# Patient Record
Sex: Female | Born: 1965 | Race: White | Hispanic: No | Marital: Married | Smoking: Former smoker
Health system: Southern US, Community
[De-identification: ages and names within clinical notes are randomized; demographics above are authoritative.]

## PROBLEM LIST (undated history)

## (undated) DIAGNOSIS — K219 Gastro-esophageal reflux disease without esophagitis: Secondary | ICD-10-CM

## (undated) DIAGNOSIS — G4730 Sleep apnea, unspecified: Secondary | ICD-10-CM

## (undated) DIAGNOSIS — Z9989 Dependence on other enabling machines and devices: Secondary | ICD-10-CM

## (undated) DIAGNOSIS — T7840XA Allergy, unspecified, initial encounter: Secondary | ICD-10-CM

## (undated) DIAGNOSIS — E119 Type 2 diabetes mellitus without complications: Secondary | ICD-10-CM

## (undated) DIAGNOSIS — J309 Allergic rhinitis, unspecified: Secondary | ICD-10-CM

## (undated) DIAGNOSIS — J45909 Unspecified asthma, uncomplicated: Secondary | ICD-10-CM

## (undated) DIAGNOSIS — J189 Pneumonia, unspecified organism: Secondary | ICD-10-CM

## (undated) HISTORY — DX: Pneumonia, unspecified organism: J18.9

## (undated) HISTORY — DX: Allergic rhinitis, unspecified: J30.9

## (undated) HISTORY — DX: Allergy, unspecified, initial encounter: T78.40XA

## (undated) HISTORY — DX: Dependence on other enabling machines and devices: Z99.89

## (undated) HISTORY — DX: Sleep apnea, unspecified: G47.30

## (undated) HISTORY — PX: APPENDECTOMY: SHX54

## (undated) HISTORY — DX: Gastro-esophageal reflux disease without esophagitis: K21.9

## (undated) HISTORY — PX: ABDOMINAL HYSTERECTOMY: SHX81

---

## 1993-10-21 HISTORY — PX: TUBAL LIGATION: SHX77

## 2006-01-21 ENCOUNTER — Ambulatory Visit: Payer: Self-pay

## 2009-03-31 DIAGNOSIS — K219 Gastro-esophageal reflux disease without esophagitis: Secondary | ICD-10-CM

## 2009-04-01 DIAGNOSIS — J309 Allergic rhinitis, unspecified: Secondary | ICD-10-CM

## 2009-04-01 DIAGNOSIS — R609 Edema, unspecified: Secondary | ICD-10-CM | POA: Insufficient documentation

## 2009-07-20 ENCOUNTER — Ambulatory Visit: Payer: Self-pay | Admitting: Family Medicine

## 2009-07-26 ENCOUNTER — Ambulatory Visit: Payer: Self-pay | Admitting: Family Medicine

## 2009-11-09 ENCOUNTER — Ambulatory Visit: Payer: Self-pay | Admitting: Family Medicine

## 2010-08-06 ENCOUNTER — Ambulatory Visit: Payer: Self-pay | Admitting: Family Medicine

## 2010-08-06 LAB — HM MAMMOGRAPHY

## 2015-10-09 ENCOUNTER — Emergency Department: Payer: 59

## 2015-10-09 ENCOUNTER — Emergency Department
Admission: EM | Admit: 2015-10-09 | Discharge: 2015-10-09 | Disposition: A | Discharge status: Home / Self Care | Payer: 59 | Attending: Emergency Medicine | Admitting: Emergency Medicine

## 2015-10-09 ENCOUNTER — Encounter: Payer: Self-pay | Admitting: Emergency Medicine

## 2015-10-09 DIAGNOSIS — J40 Bronchitis, not specified as acute or chronic: Secondary | ICD-10-CM | POA: Insufficient documentation

## 2015-10-09 DIAGNOSIS — R05 Cough: Secondary | ICD-10-CM | POA: Diagnosis present

## 2015-10-09 MED ORDER — IPRATROPIUM-ALBUTEROL 0.5-2.5 (3) MG/3ML IN SOLN
9.0000 mL | Freq: Once | RESPIRATORY_TRACT | Status: AC
  Administered 2015-10-09: 9 mL via RESPIRATORY_TRACT
  Filled 2015-10-09: qty 9

## 2015-10-09 MED ORDER — PREDNISONE 20 MG PO TABS
60.0000 mg | ORAL_TABLET | Freq: Once | ORAL | Status: AC
  Administered 2015-10-09: 60 mg via ORAL
  Filled 2015-10-09: qty 3

## 2015-10-09 MED ORDER — PREDNISONE 20 MG PO TABS: 40.0000 mg | ORAL_TABLET | Freq: Every day | ORAL | Status: DC

## 2015-10-09 MED ORDER — ALBUTEROL SULFATE HFA 108 (90 BASE) MCG/ACT IN AERS: 2.0000 | INHALATION_SPRAY | RESPIRATORY_TRACT | Status: DC | PRN

## 2015-10-09 MED ORDER — HYDROCODONE-HOMATROPINE 5-1.5 MG/5ML PO SYRP: 5.0000 mL | ORAL_SOLUTION | Freq: Four times a day (QID) | ORAL | Status: DC | PRN

## 2015-10-09 NOTE — ED Notes (Signed)
Pt reports dx bronchitis 2wks ago, rx zpak and inhaler PRN; last few days having persistent nonprod cough and wheezing

## 2015-10-09 NOTE — ED Provider Notes (Signed)
[facility] Emergency Department Provider Note  ____________________________________________  Time seen: 2:50 AM  I have reviewed the triage vital signs and the nursing notes.   HISTORY  Chief Complaint Cough    HPI ILEE RANDLEMAN is a 49 y.o. female who complains of shortness of breath and a productive cough. She had been diagnosed with bronchitis 2 weeks ago and prescribed a Z-Pak Tessalon and albuterol inhaler. She had noticed some improvement after that, but in the last day or 2 she is more short of breath and now coughing as well. Denies fever. Denies chest pain. No pleuritic pain. No dizziness abdominal pain or back pain. No vomiting or diarrhea. No travel, trauma, hospitalization or surgeries. No other medical problems.     History reviewed. No pertinent past medical history.   There are no active problems to display for this patient.    Past Surgical History  Procedure Laterality Date  . Appendectomy    . Abdominal hysterectomy       Current Outpatient Rx  Name  Route  Sig  Dispense  Refill  . HYDROcodone-homatropine (HYCODAN) 5-1.5 MG/5ML syrup   Oral   Take 5 mLs by mouth every 6 (six) hours as needed for cough.   120 mL   0   . predniSONE (DELTASONE) 20 MG tablet   Oral   Take 2 tablets (40 mg total) by mouth daily.   8 tablet   0      Allergies Review of patient's allergies indicates no known allergies.   No family history on file.  Social History Social History  Substance Use Topics  . Smoking status: Never Smoker   . Smokeless tobacco: None  . Alcohol Use: No    Review of Systems  Constitutional:   No fever or chills. No weight changes Eyes:   No blurry vision or double vision.  ENT:   No sore throat. Cardiovascular:   No chest pain. Respiratory:   Positive shortness of breath and cough. Gastrointestinal:   Negative for abdominal pain, vomiting and diarrhea.  No BRBPR or melena. Genitourinary:    Negative for dysuria, urinary retention, bloody urine, or difficulty urinating. Musculoskeletal:   Negative for back pain. No joint swelling or pain. Skin:   Negative for rash. Neurological:   Negative for headaches, focal weakness or numbness. Psychiatric:  No anxiety or depression.   Endocrine:  No hot/cold intolerance, changes in energy, or sleep difficulty.  10-point ROS otherwise negative.  ____________________________________________   PHYSICAL EXAM:  VITAL SIGNS: ED Triage Vitals  Enc Vitals Group     BP 10/09/15 0241 142/72 mmHg     Pulse Rate 10/09/15 0241 105     Resp 10/09/15 0241 18     Temp 10/09/15 0241 98 F (36.7 C)     Temp Source 10/09/15 0241 Oral     SpO2 10/09/15 0241 94 %     Weight 10/09/15 0241 215 lb (97.523 kg)     Height 10/09/15 0241  (1.651 m)     Head Cir --      Peak Flow --      Pain Score --      Pain Loc --      Pain Edu? --      Excl. in GC? --     Vital signs reviewed, nursing assessments reviewed.   Constitutional:   Alert and oriented. Well appearing and in no distress. Eyes:   No scleral icterus. No conjunctival pallor. PERRL.  EOMI ENT   Head:   Normocephalic and atraumatic.   Nose:   No congestion/rhinnorhea. No septal hematoma   Mouth/Throat:   MMM, no pharyngeal erythema. No peritonsillar mass. No uvula shift.   Neck:   No stridor. No SubQ emphysema. No meningismus. Hematological/Lymphatic/Immunilogical:   No cervical lymphadenopathy. Cardiovascular:   Mild tachycardia heart rate 105. Normal and symmetric distal pulses are present in all extremities. No murmurs, rubs, or gallops. Respiratory:   Normal respiratory effort without tachypnea nor retractions. Diffuse wheezing with prolonged expiratory phase Gastrointestinal:   Soft and nontender. No distention. There is no CVA tenderness.  No rebound, rigidity, or guarding. Genitourinary:   deferred Musculoskeletal:   Nontender with normal range of motion in all  extremities. No joint effusions.  No lower extremity tenderness.  No edema. Neurologic:   Normal speech and language.  CN 2-10 normal. Motor grossly intact. No pronator drift.  Normal gait. No gross focal neurologic deficits are appreciated.  Skin:    Skin is warm, dry and intact. No rash noted.  No petechiae, purpura, or bullae. Psychiatric:   Mood and affect are normal. Speech and behavior are normal. Patient exhibits appropriate insight and judgment.  ____________________________________________    LABS (pertinent positives/negatives) (all labs ordered are listed, but only abnormal results are displayed) Labs Reviewed - No data to display ____________________________________________   EKG    ____________________________________________    RADIOLOGY  Chest x-ray unremarkable  ____________________________________________   PROCEDURES   ____________________________________________   INITIAL IMPRESSION / ASSESSMENT AND PLAN / ED COURSE  Pertinent labs & imaging results that were available during my care of the patient were reviewed by me and considered in my medical decision making (see chart for details).  Patient presents with wheezing and shortness of breath consistent with bronchitis and bronchospasm. Low suspicion for ACS PE TAD pneumothorax carditis mediastinitis pneumonia sepsis or airway foreign body. With her recent treatment with antibiotics and productive cough, we'll recheck a chest x-ray. I'll start her on a steroid burst and give DuoNeb's now.  ----------------------------------------- 4:09 AM on 10/09/2015 -----------------------------------------  Patient feels better. Vitals normal. Wheezing is improved on exam. We'll discharge her home with Hycodan and a steroid burst. Follow with primary care.   ____________________________________________   FINAL CLINICAL IMPRESSION(S) / ED DIAGNOSES  Final diagnoses:  Bronchitis      Sharman CheekPhillip  Asante Blanda, MD 10/09/15 [phone]

## 2015-10-12 ENCOUNTER — Encounter: Payer: Self-pay | Admitting: Family Medicine

## 2015-10-12 ENCOUNTER — Ambulatory Visit (INDEPENDENT_AMBULATORY_CARE_PROVIDER_SITE_OTHER): Payer: 59 | Admitting: Family Medicine

## 2015-10-12 VITALS — BP 122/72 | HR 86 | Temp 98.5°F | Resp 16 | Ht 65.0 in | Wt 236.0 lb

## 2015-10-12 DIAGNOSIS — B370 Candidal stomatitis: Secondary | ICD-10-CM | POA: Diagnosis not present

## 2015-10-12 DIAGNOSIS — J209 Acute bronchitis, unspecified: Secondary | ICD-10-CM

## 2015-10-12 DIAGNOSIS — N912 Amenorrhea, unspecified: Secondary | ICD-10-CM | POA: Insufficient documentation

## 2015-10-12 MED ORDER — NYSTATIN 100000 UNIT/ML MT SUSP: 5.0000 mL | Freq: Four times a day (QID) | OROMUCOSAL | Status: DC

## 2015-10-12 NOTE — Progress Notes (Signed)
Subjective:    Patient ID: Nancy Blake, female    DOB: 08-22-1966, 49 y.o.   MRN: [id number]  HPI   Follow up Hospitalization  Patient was admitted to [facility] ED on 10/09/2015 and discharged on same day. She was treated for SOB and productive cough; was diagnosed with bronchitis. Treatment for this included CXR (negative), Hycodan, DuoNeb, Prednisone. She reports good compliance with treatment. She reports this condition is Improved.  ------------------------------------------------------------------------------------    Review of Systems  Constitutional: Positive for appetite change and fatigue. Negative for fever, chills, diaphoresis, activity change and unexpected weight change.  Respiratory: Positive for cough and wheezing (during night). Negative for shortness of breath.   Cardiovascular: Negative for chest pain.   BP 122/72 mmHg  Pulse 86  Temp(Src) 98.5 F (36.9 C) (Oral)  Resp 16  Ht  (1.651 m)  Wt 236 lb (107.049 kg)  BMI 39.27 kg/m2  SpO2 97%   Patient Active Problem List   Diagnosis Date Noted  . Absence of menstruation 10/12/2015  . Allergic rhinitis 04/01/2009  . Accumulation of fluid in tissues 04/01/2009  . Acid reflux 03/31/2009   No past medical history on file. Current Outpatient Prescriptions on File Prior to Visit  Medication Sig  . albuterol (PROVENTIL HFA) 108 (90 BASE) MCG/ACT inhaler Inhale 2 puffs into the lungs every 4 (four) hours as needed for wheezing or shortness of breath.  Marland Kitchen HYDROcodone-homatropine (HYCODAN) 5-1.5 MG/5ML syrup Take 5 mLs by mouth every 6 (six) hours as needed for cough.   No current facility-administered medications on file prior to visit.   No Known Allergies Past Surgical History  Procedure Laterality Date  . Appendectomy    . Abdominal hysterectomy    . Tubal ligation  1995   Social History   Social History  . Marital Status: Married    Spouse Name: N/A  . Number of Children: N/A  . Years of  Education: N/A   Occupational History  . Not on file.   Social History Main Topics  . Smoking status: Former Smoker    Quit date: 10/21/1983  . Smokeless tobacco: Never Used  . Alcohol Use: No  . Drug Use: No  . Sexual Activity: Not on file   Other Topics Concern  . Not on file   Social History Narrative   Family History  Problem Relation Age of Onset  . CAD Mother   . Healthy Father   . Healthy Brother   . Osteoporosis Maternal Grandmother   . COPD Maternal Grandfather   . Lung cancer Paternal Grandfather        Objective:   Physical Exam  Constitutional: She appears well-developed and well-nourished.  HENT:  Right Ear: Tympanic membrane normal.  Left Ear: Tympanic membrane normal.  Nose: Nose normal.  Oral thrush noted  Cardiovascular: Normal rate and regular rhythm.   Pulmonary/Chest: Effort normal and breath sounds normal.  Psychiatric: She has a normal mood and affect. Her behavior is normal.       Assessment & Plan:  1. Oral candidiasis New problem. Treat as below. - nystatin (MYCOSTATIN) 100000 UNIT/ML suspension; Take 5 mLs (500,000 Units total) by mouth 4 (four) times daily.  Dispense: 60 mL; Refill: 0  2. Acute bronchitis treated with antibiotics in the past 60 days Improving. Continue Hycodan PRN. Call if worsens or does not improve and will call in another antibiotic.    Patient seen and examined by Leo Grosser, MD, and note scribed by  Allene DillonEmily Drozdowski, CMA. I have reviewed the document for accuracy and completeness and I agree with above. Leo Grosser- Kallista Pae J. Kavari Parrillo, MD   Nancy PhenixNancy Alitzel Cookson, MD

## 2015-10-13 ENCOUNTER — Encounter: Payer: Self-pay | Admitting: Family Medicine

## 2015-10-13 ENCOUNTER — Encounter: Payer: Self-pay | Admitting: Physician Assistant

## 2015-10-14 ENCOUNTER — Encounter: Payer: Self-pay | Admitting: Emergency Medicine

## 2015-10-14 ENCOUNTER — Emergency Department: Payer: 59

## 2015-10-14 ENCOUNTER — Observation Stay
Admission: EM | Admit: 2015-10-14 | Discharge: 2015-10-16 | Disposition: A | Discharge status: Home / Self Care | Payer: 59 | Attending: Internal Medicine | Admitting: Internal Medicine

## 2015-10-14 DIAGNOSIS — J209 Acute bronchitis, unspecified: Secondary | ICD-10-CM

## 2015-10-14 DIAGNOSIS — Z7951 Long term (current) use of inhaled steroids: Secondary | ICD-10-CM | POA: Insufficient documentation

## 2015-10-14 DIAGNOSIS — B370 Candidal stomatitis: Secondary | ICD-10-CM | POA: Diagnosis not present

## 2015-10-14 DIAGNOSIS — J309 Allergic rhinitis, unspecified: Secondary | ICD-10-CM | POA: Diagnosis not present

## 2015-10-14 DIAGNOSIS — J40 Bronchitis, not specified as acute or chronic: Secondary | ICD-10-CM | POA: Diagnosis present

## 2015-10-14 DIAGNOSIS — D72829 Elevated white blood cell count, unspecified: Secondary | ICD-10-CM | POA: Diagnosis not present

## 2015-10-14 DIAGNOSIS — Z801 Family history of malignant neoplasm of trachea, bronchus and lung: Secondary | ICD-10-CM | POA: Diagnosis not present

## 2015-10-14 DIAGNOSIS — Z90710 Acquired absence of both cervix and uterus: Secondary | ICD-10-CM | POA: Insufficient documentation

## 2015-10-14 DIAGNOSIS — Z6835 Body mass index (BMI) 35.0-35.9, adult: Secondary | ICD-10-CM | POA: Insufficient documentation

## 2015-10-14 DIAGNOSIS — J45901 Unspecified asthma with (acute) exacerbation: Secondary | ICD-10-CM | POA: Diagnosis not present

## 2015-10-14 DIAGNOSIS — E669 Obesity, unspecified: Secondary | ICD-10-CM | POA: Insufficient documentation

## 2015-10-14 DIAGNOSIS — Z87891 Personal history of nicotine dependence: Secondary | ICD-10-CM | POA: Diagnosis not present

## 2015-10-14 DIAGNOSIS — R079 Chest pain, unspecified: Secondary | ICD-10-CM | POA: Insufficient documentation

## 2015-10-14 DIAGNOSIS — Z79899 Other long term (current) drug therapy: Secondary | ICD-10-CM | POA: Insufficient documentation

## 2015-10-14 DIAGNOSIS — J45909 Unspecified asthma, uncomplicated: Secondary | ICD-10-CM | POA: Diagnosis present

## 2015-10-14 DIAGNOSIS — Z8249 Family history of ischemic heart disease and other diseases of the circulatory system: Secondary | ICD-10-CM | POA: Diagnosis not present

## 2015-10-14 DIAGNOSIS — R062 Wheezing: Secondary | ICD-10-CM | POA: Insufficient documentation

## 2015-10-14 DIAGNOSIS — K219 Gastro-esophageal reflux disease without esophagitis: Secondary | ICD-10-CM | POA: Insufficient documentation

## 2015-10-14 DIAGNOSIS — Z8262 Family history of osteoporosis: Secondary | ICD-10-CM | POA: Diagnosis not present

## 2015-10-14 DIAGNOSIS — R05 Cough: Secondary | ICD-10-CM | POA: Diagnosis not present

## 2015-10-14 DIAGNOSIS — Z825 Family history of asthma and other chronic lower respiratory diseases: Secondary | ICD-10-CM | POA: Diagnosis not present

## 2015-10-14 LAB — CBC WITH DIFFERENTIAL/PLATELET
Basophils Absolute: 0.1 10*3/uL (ref 0–0.1)
Basophils Relative: 1 %
Eosinophils Absolute: 1 10*3/uL — ABNORMAL HIGH (ref 0–0.7)
Eosinophils Relative: 7 %
HEMATOCRIT: 40.7 % (ref 35.0–47.0)
Hemoglobin: 13.4 g/dL (ref 12.0–16.0)
LYMPHS PCT: 22 %
Lymphs Abs: 3.1 10*3/uL (ref 1.0–3.6)
MCH: 26 pg (ref 26.0–34.0)
MCHC: 33 g/dL (ref 32.0–36.0)
MCV: 78.7 fL — AB (ref 80.0–100.0)
MONO ABS: 0.7 10*3/uL (ref 0.2–0.9)
MONOS PCT: 5 %
NEUTROS ABS: 9.6 10*3/uL — AB (ref 1.4–6.5)
Neutrophils Relative %: 65 %
Platelets: 271 10*3/uL (ref 150–440)
RBC: 5.18 MIL/uL (ref 3.80–5.20)
RDW: 14.4 % (ref 11.5–14.5)
WBC: 14.4 10*3/uL — ABNORMAL HIGH (ref 3.6–11.0)

## 2015-10-14 LAB — BASIC METABOLIC PANEL
Anion gap: 8 (ref 5–15)
BUN: 11 mg/dL (ref 6–20)
CO2: 28 mmol/L (ref 22–32)
CREATININE: 0.79 mg/dL (ref 0.44–1.00)
Calcium: 8.7 mg/dL — ABNORMAL LOW (ref 8.9–10.3)
Chloride: 98 mmol/L — ABNORMAL LOW (ref 101–111)
GFR calc Af Amer: >60 mL/min (ref >60)
GFR calc non Af Amer: >60 mL/min (ref >60)
GLUCOSE: 157 mg/dL — AB (ref 65–99)
Potassium: 3.8 mmol/L (ref 3.5–5.1)
Sodium: 134 mmol/L — ABNORMAL LOW (ref 135–145)

## 2015-10-14 LAB — TROPONIN I: Troponin I: <0.03 ng/mL (ref <0.031)

## 2015-10-14 LAB — TSH: TSH: 2.238 u[IU]/mL (ref 0.350–4.500)

## 2015-10-14 LAB — HEMOGLOBIN A1C: Hgb A1c MFr Bld: 6.9 % — ABNORMAL HIGH (ref 4.0–6.0)

## 2015-10-14 MED ORDER — PREDNISONE 50 MG PO TABS: 50.0000 mg | ORAL_TABLET | Freq: Every day | ORAL | Status: DC

## 2015-10-14 MED ORDER — CETYLPYRIDINIUM CHLORIDE 0.05 % MT LIQD
7.0000 mL | Freq: Two times a day (BID) | OROMUCOSAL | Status: DC
  Administered 2015-10-15 (×2): 7 mL via OROMUCOSAL

## 2015-10-14 MED ORDER — LEVOFLOXACIN 500 MG PO TABS
500.0000 mg | ORAL_TABLET | Freq: Every day | ORAL | Status: DC
  Administered 2015-10-15 – 2015-10-16 (×2): 500 mg via ORAL
  Filled 2015-10-14 (×2): qty 1

## 2015-10-14 MED ORDER — ALBUTEROL SULFATE (2.5 MG/3ML) 0.083% IN NEBU: 2.5000 mg | INHALATION_SOLUTION | RESPIRATORY_TRACT | Status: DC | PRN

## 2015-10-14 MED ORDER — IBUPROFEN 600 MG PO TABS: 600.0000 mg | ORAL_TABLET | Freq: Four times a day (QID) | ORAL | Status: DC | PRN

## 2015-10-14 MED ORDER — DOCUSATE SODIUM 100 MG PO CAPS
100.0000 mg | ORAL_CAPSULE | Freq: Two times a day (BID) | ORAL | Status: DC
  Administered 2015-10-15 – 2015-10-16 (×3): 100 mg via ORAL
  Filled 2015-10-14 (×4): qty 1

## 2015-10-14 MED ORDER — PANTOPRAZOLE SODIUM 40 MG PO TBEC
40.0000 mg | DELAYED_RELEASE_TABLET | Freq: Every day | ORAL | Status: DC
  Administered 2015-10-14 – 2015-10-16 (×3): 40 mg via ORAL
  Filled 2015-10-14 (×4): qty 1

## 2015-10-14 MED ORDER — MORPHINE SULFATE (PF) 2 MG/ML IV SOLN: 2.0000 mg | INTRAVENOUS | Status: DC | PRN

## 2015-10-14 MED ORDER — IPRATROPIUM-ALBUTEROL 0.5-2.5 (3) MG/3ML IN SOLN
RESPIRATORY_TRACT | Status: AC
  Administered 2015-10-14: 9 mL via RESPIRATORY_TRACT
  Filled 2015-10-14: qty 9

## 2015-10-14 MED ORDER — IPRATROPIUM-ALBUTEROL 0.5-2.5 (3) MG/3ML IN SOLN
9.0000 mL | Freq: Once | RESPIRATORY_TRACT | Status: AC
  Administered 2015-10-14: 9 mL via RESPIRATORY_TRACT

## 2015-10-14 MED ORDER — SODIUM CHLORIDE 0.9 % IJ SOLN
3.0000 mL | Freq: Two times a day (BID) | INTRAMUSCULAR | Status: DC
  Administered 2015-10-14 – 2015-10-16 (×5): 3 mL via INTRAVENOUS

## 2015-10-14 MED ORDER — PREDNISONE 5 MG PO TABS: 5.0000 mg | ORAL_TABLET | Freq: Every day | ORAL | Status: DC

## 2015-10-14 MED ORDER — ACETAMINOPHEN 650 MG RE SUPP: 650.0000 mg | Freq: Four times a day (QID) | RECTAL | Status: DC | PRN

## 2015-10-14 MED ORDER — PREDNISONE 5 MG PO TABS: 10.0000 mg | ORAL_TABLET | Freq: Every day | ORAL | Status: DC

## 2015-10-14 MED ORDER — PREDNISONE 5 MG PO TABS: 30.0000 mg | ORAL_TABLET | Freq: Every day | ORAL | Status: DC

## 2015-10-14 MED ORDER — NYSTATIN 100000 UNIT/ML MT SUSP
5.0000 mL | Freq: Four times a day (QID) | OROMUCOSAL | Status: DC
  Administered 2015-10-14 – 2015-10-16 (×8): 500000 [IU] via ORAL
  Filled 2015-10-14 (×8): qty 5

## 2015-10-14 MED ORDER — MOMETASONE FURO-FORMOTEROL FUM 100-5 MCG/ACT IN AERO
2.0000 | INHALATION_SPRAY | Freq: Two times a day (BID) | RESPIRATORY_TRACT | Status: DC
  Administered 2015-10-14 – 2015-10-16 (×4): 2 via RESPIRATORY_TRACT
  Filled 2015-10-14: qty 8.8

## 2015-10-14 MED ORDER — ACETAMINOPHEN 325 MG PO TABS
650.0000 mg | ORAL_TABLET | Freq: Four times a day (QID) | ORAL | Status: DC | PRN
  Administered 2015-10-15: 650 mg via ORAL
  Filled 2015-10-14: qty 2

## 2015-10-14 MED ORDER — ONDANSETRON HCL 4 MG PO TABS: 4.0000 mg | ORAL_TABLET | Freq: Four times a day (QID) | ORAL | Status: DC | PRN

## 2015-10-14 MED ORDER — ALBUTEROL SULFATE (2.5 MG/3ML) 0.083% IN NEBU
7.5000 mg | INHALATION_SOLUTION | Freq: Once | RESPIRATORY_TRACT | Status: AC
  Administered 2015-10-14: 7.5 mg via RESPIRATORY_TRACT
  Filled 2015-10-14: qty 9

## 2015-10-14 MED ORDER — ONDANSETRON HCL 4 MG/2ML IJ SOLN
4.0000 mg | Freq: Four times a day (QID) | INTRAMUSCULAR | Status: DC | PRN
Start: 2015-10-14 — End: 2015-10-16

## 2015-10-14 MED ORDER — IPRATROPIUM-ALBUTEROL 0.5-2.5 (3) MG/3ML IN SOLN
3.0000 mL | RESPIRATORY_TRACT | Status: DC
  Administered 2015-10-14 – 2015-10-16 (×11): 3 mL via RESPIRATORY_TRACT
  Filled 2015-10-14 (×11): qty 3

## 2015-10-14 MED ORDER — PREDNISONE 20 MG PO TABS: 40.0000 mg | ORAL_TABLET | Freq: Every day | ORAL | Status: DC

## 2015-10-14 MED ORDER — METHYLPREDNISOLONE SODIUM SUCC 125 MG IJ SOLR
125.0000 mg | Freq: Once | INTRAMUSCULAR | Status: AC
  Administered 2015-10-14: 125 mg via INTRAVENOUS
  Filled 2015-10-14: qty 2

## 2015-10-14 MED ORDER — LEVOFLOXACIN IN D5W 750 MG/150ML IV SOLN
750.0000 mg | Freq: Once | INTRAVENOUS | Status: AC
  Administered 2015-10-14: 750 mg via INTRAVENOUS
  Filled 2015-10-14: qty 150

## 2015-10-14 MED ORDER — HYDROCODONE-HOMATROPINE 5-1.5 MG/5ML PO SYRP
5.0000 mL | ORAL_SOLUTION | Freq: Four times a day (QID) | ORAL | Status: DC | PRN
  Administered 2015-10-15: 5 mL via ORAL
  Filled 2015-10-14: qty 5

## 2015-10-14 MED ORDER — HEPARIN SODIUM (PORCINE) 5000 UNIT/ML IJ SOLN
5000.0000 [IU] | Freq: Three times a day (TID) | INTRAMUSCULAR | Status: DC
  Administered 2015-10-14 (×2): 5000 [IU] via SUBCUTANEOUS
  Filled 2015-10-14 (×3): qty 1

## 2015-10-14 MED ORDER — CHLORHEXIDINE GLUCONATE 0.12 % MT SOLN
15.0000 mL | Freq: Two times a day (BID) | OROMUCOSAL | Status: DC
  Administered 2015-10-14 – 2015-10-16 (×5): 15 mL via OROMUCOSAL
  Filled 2015-10-14 (×6): qty 15

## 2015-10-14 NOTE — ED Provider Notes (Signed)
[facility] Emergency Department Provider Note  ____________________________________________  Time seen: Approximately 9:15 AM  I have reviewed the triage vital signs and the nursing notes.   HISTORY  Chief Complaint Shortness of Breath    HPI Nancy Blake is a 49 y.o. female with a history of acid reflux was presenting today with a month of waxing and waning shortness of breath and chest tightness. This is her third visit to a health care provider. She most recently finished a 5 day course of steroids as well as a Z-Pak several days ago. She is having a cough productive of sputum. No fevers. No sick contact of her husband. Describes her chest pain is tight and throughout her chest with deep breathing. Does not smoke. No history of asthma or chronic bronchitis.   History reviewed. No pertinent past medical history.  Patient Active Problem List   Diagnosis Date Noted  . Absence of menstruation 10/12/2015  . Allergic rhinitis 04/01/2009  . Accumulation of fluid in tissues 04/01/2009  . Acid reflux 03/31/2009    Past Surgical History  Procedure Laterality Date  . Appendectomy    . Abdominal hysterectomy    . Tubal ligation  1995    Current Outpatient Rx  Name  Route  Sig  Dispense  Refill  . albuterol (PROVENTIL HFA) 108 (90 BASE) MCG/ACT inhaler   Inhalation   Inhale 2 puffs into the lungs every 4 (four) hours as needed for wheezing or shortness of breath.   1 Inhaler   0   . HYDROcodone-homatropine (HYCODAN) 5-1.5 MG/5ML syrup   Oral   Take 5 mLs by mouth every 6 (six) hours as needed for cough.   120 mL   0   . ibuprofen (ADVIL,MOTRIN) 200 MG tablet   Oral   Take 600 mg by mouth every 6 (six) hours as needed for mild pain or moderate pain.         Marland Kitchen. lansoprazole (PREVACID) 15 MG capsule   Oral   Take 15 mg by mouth daily at 12 noon.         . nystatin (MYCOSTATIN) 100000 UNIT/ML suspension   Oral   Take 5 mLs (500,000 Units  total) by mouth 4 (four) times daily.   60 mL   0     Allergies Review of patient's allergies indicates no known allergies.  Family History  Problem Relation Age of Onset  . CAD Mother   . Healthy Father   . Healthy Brother   . Osteoporosis Maternal Grandmother   . COPD Maternal Grandfather   . Lung cancer Paternal Grandfather     Social History Social History  Substance Use Topics  . Smoking status: Former Smoker    Quit date: 10/21/1983  . Smokeless tobacco: Never Used  . Alcohol Use: No    Review of Systems Constitutional: No fever/chills Eyes: No visual changes. ENT: No sore throat. Cardiovascular: Tightness as above Respiratory: As above  Gastrointestinal: No abdominal pain.  No nausea, no vomiting.  No diarrhea.  No constipation. Genitourinary: Negative for dysuria. Musculoskeletal: Negative for back pain. Skin: Negative for rash. Neurological: Negative for headaches, focal weakness or numbness.  10-point ROS otherwise negative.  ____________________________________________   PHYSICAL EXAM:  VITAL SIGNS: ED Triage Vitals  Enc Vitals Group     BP 10/14/15 0918 160/73 mmHg     Pulse Rate 10/14/15 0918 96     Resp 10/14/15 0918 26     Temp --  Temp src --      SpO2 10/14/15 0918 93 %     Weight 10/14/15 0919 215 lb (97.523 kg)     Height 10/14/15 0919  (1.651 m)     Head Cir --      Peak Flow --      Pain Score 10/14/15 0919 7     Pain Loc --      Pain Edu? --      Excl. in GC? --     Constitutional: Alert and oriented.  in no acute distress. Eyes: Conjunctivae are normal. PERRL. EOMI. Head: Atraumatic. Nose: No congestion/rhinnorhea. Mouth/Throat: Mucous membranes are moist.  Oropharynx non-erythematous. Neck: No stridor.   Cardiovascular: Normal rate, regular rhythm. Grossly normal heart sounds.  Good peripheral circulation. Respiratory: Tachypneic with increased respiratory effort without any retractions but with pressured speech  secondary to her shortness of breath. Rhonchorous throughout with increased expiratory phase and wheezing diffusely.  Gastrointestinal: Soft and nontender. No distention.  Musculoskeletal: No lower extremity tenderness nor edema.  No joint effusions. Neurologic:  Normal speech and language. No gross focal neurologic deficits are appreciated.  Skin:  Skin is warm, dry and intact. No rash noted. Psychiatric: Mood and affect are normal. Speech and behavior are normal.  ____________________________________________   LABS (all labs ordered are listed, but only abnormal results are displayed)  Labs Reviewed  CBC WITH DIFFERENTIAL/PLATELET - Abnormal; Notable for the following:    WBC 14.4 (*)    MCV 78.7 (*)    Neutro Abs 9.6 (*)    Eosinophils Absolute 1.0 (*)    All other components within normal limits  BASIC METABOLIC PANEL - Abnormal; Notable for the following:    Sodium 134 (*)    Chloride 98 (*)    Glucose, Bld 157 (*)    Calcium 8.7 (*)    All other components within normal limits  TROPONIN I   ____________________________________________  EKG  ED ECG REPORT I, Arelia Longest, the attending physician, personally viewed and interpreted this ECG.   Date: 10/14/2015  EKG Time: 925  Rate: 108  Rhythm: sinus tachycardia  Axis: Borderline left axis deviation  Intervals:none  ST&T Change: 0.5-1 mm ST segment elevation in lead aVR. Several PVCs. No abnormal T-wave inversions. No ST depressions.  ____________________________________________  RADIOLOGY  No active cardiopulmonary disease on the chest x-ray. ____________________________________________   PROCEDURES  ____________________________________________   INITIAL IMPRESSION / ASSESSMENT AND PLAN / ED COURSE  Pertinent labs & imaging results that were available during my care of the patient were reviewed by me and considered in my medical decision making (see chart for  details).  ----------------------------------------- 11:19 AM on 10/14/2015 -----------------------------------------  After 3 DuoNeb the patient is still wheezing throughout with rhonchi. Prolonged expiratory phase with shortness of breath. We'll admit to the hospital for persistent symptoms and failure of outpatient treatment. Explained to the patient and the family who understand the plan and are willing to comply. Signed out to Dr. Sheryle Hail of the internal medicine service. ____________________________________________   FINAL CLINICAL IMPRESSION(S) / ED DIAGNOSES  Bronchitis. Failure of outpatient treatment.    Myrna Blazer, MD 10/14/15 1120

## 2015-10-14 NOTE — ED Notes (Signed)
Pt sob in triage, sats 92% on RA, wheezing heard, pt appears in moderate distress, husband states coughing has continued since treated here for the same on Monday.

## 2015-10-14 NOTE — H&P (Signed)
Nancy Blake is an 49 y.o. female.   Chief Complaint: Shortness of breath HPI: The patient presents emergency department complaining of shortness of breath. She has been coughing for nearly a month and had been on a steroid taper for bronchitis prior to today's admission. She denies sputum production but admits that she has no relief from her albuterol inhaler. In the emergency department she was not hypoxic but very tachypneic. After multiple breathing treatments and Solu-Medrol the patient still had significant wheezing and subjective shortness of breath which prompted the emergency department staff to call for admission.  History reviewed. No pertinent past medical history.  Past Surgical History  Procedure Laterality Date  . Appendectomy    . Abdominal hysterectomy    . Tubal ligation  1995    Family History  Problem Relation Age of Onset  . CAD Mother   . Healthy Father   . Healthy Brother   . Osteoporosis Maternal Grandmother   . COPD Maternal Grandfather   . Lung cancer Paternal Grandfather    Social History:  reports that she quit smoking about 32 years ago. She has never used smokeless tobacco. She reports that she does not drink alcohol or use illicit drugs.  Allergies: No Known Allergies  Prior to Admission medications   Medication Sig Start Date End Date Taking? Authorizing Provider  albuterol (PROVENTIL HFA) 108 (90 BASE) MCG/ACT inhaler Inhale 2 puffs into the lungs every 4 (four) hours as needed for wheezing or shortness of breath. 10/09/15  Yes Carrie Mew, MD  HYDROcodone-homatropine [facility]) 5-1.5 MG/5ML syrup Take 5 mLs by mouth every 6 (six) hours as needed for cough. 10/09/15  Yes Carrie Mew, MD  ibuprofen (ADVIL,MOTRIN) 200 MG tablet Take 600 mg by mouth every 6 (six) hours as needed for mild pain or moderate pain.   Yes Historical Provider, MD  lansoprazole (PREVACID) 15 MG capsule Take 15 mg by mouth daily at 12 noon.   Yes Historical Provider,  MD  nystatin (MYCOSTATIN) 100000 UNIT/ML suspension Take 5 mLs (500,000 Units total) by mouth 4 (four) times daily. 10/12/15  Yes Margarita Rana, MD     Results for orders placed or performed during the hospital encounter of 10/14/15 (from the past 48 hour(s))  CBC with Differential     Status: Abnormal   Collection Time: 10/14/15  9:31 AM  Result Value Ref Range   WBC 14.4 (H) 3.6 - 11.0 K/uL   RBC 5.18 3.80 - 5.20 MIL/uL   Hemoglobin 13.4 12.0 - 16.0 g/dL   HCT 40.7 35.0 - 47.0 %   MCV 78.7 (L) 80.0 - 100.0 fL   MCH 26.0 26.0 - 34.0 pg   MCHC 33.0 32.0 - 36.0 g/dL   RDW 14.4 11.5 - 14.5 %   Platelets 271 150 - 440 K/uL   Neutrophils Relative % 65 %   Neutro Abs 9.6 (H) 1.4 - 6.5 K/uL   Lymphocytes Relative 22 %   Lymphs Abs 3.1 1.0 - 3.6 K/uL   Monocytes Relative 5 %   Monocytes Absolute 0.7 0.2 - 0.9 K/uL   Eosinophils Relative 7 %   Eosinophils Absolute 1.0 (H) 0 - 0.7 K/uL   Basophils Relative 1 %   Basophils Absolute 0.1 0 - 0.1 K/uL  Basic metabolic panel     Status: Abnormal   Collection Time: 10/14/15  9:31 AM  Result Value Ref Range   Sodium 134 (L) 135 - 145 mmol/L   Potassium 3.8 3.5 - 5.1 mmol/L  Chloride 98 (L) 101 - 111 mmol/L   CO2 28 22 - 32 mmol/L   Glucose, Bld 157 (H) 65 - 99 mg/dL   BUN 11 6 - 20 mg/dL   Creatinine, Ser 0.79 0.44 - 1.00 mg/dL   Calcium 8.7 (L) 8.9 - 10.3 mg/dL   GFR calc non Af Amer >60 >60 mL/min   GFR calc Af Amer >60 >60 mL/min    Comment: (NOTE) The eGFR has been calculated using the CKD EPI equation. This calculation has not been validated in all clinical situations. eGFR's persistently <60 mL/min signify possible Chronic Kidney Disease.    Anion gap 8 5 - 15  Troponin I     Status: None   Collection Time: 10/14/15  9:31 AM  Result Value Ref Range   Troponin I <0.03 <0.031 ng/mL    Comment:        NO INDICATION OF MYOCARDIAL INJURY.    Dg Chest 1 View  10/14/2015  CLINICAL DATA:  Shortness of breath during exam with  cough since Monday. EXAM: CHEST 1 VIEW COMPARISON:  October 09, 2015 FINDINGS: The heart size and mediastinal contours are within normal limits. Both lungs are clear. The visualized skeletal structures are unremarkable. IMPRESSION: No active cardiopulmonary disease. Electronically Signed   By: Abelardo Diesel M.D.   On: 10/14/2015 09:49    Review of Systems  Constitutional: Negative for fever and chills.  HENT: Negative for sore throat and tinnitus.   Eyes: Negative for blurred vision and redness.  Respiratory: Positive for cough and shortness of breath. Negative for sputum production.   Cardiovascular: Negative for chest pain, palpitations, orthopnea and PND.  Gastrointestinal: Negative for nausea, vomiting, abdominal pain and diarrhea.  Genitourinary: Negative for dysuria, urgency and frequency.  Musculoskeletal: Negative for myalgias and joint pain.  Skin: Negative for rash.       No lesions  Neurological: Negative for speech change, focal weakness and weakness.  Endo/Heme/Allergies: Does not bruise/bleed easily.       No temperature intolerance  Psychiatric/Behavioral: Negative for depression and suicidal ideas.    Blood pressure 131/83, pulse 104, resp. rate 21, height '5\' 5"'  (1.651 m), weight 97.523 kg (215 lb), SpO2 94 %. Physical Exam  Nursing note and vitals reviewed. Constitutional: She is oriented to person, place, and time. She appears well-developed and well-nourished. No distress.  HENT:  Head: Normocephalic and atraumatic.  Mouth/Throat: Oropharynx is clear and moist.  Eyes: EOM are normal. Pupils are equal, round, and reactive to light.  Neck: Normal range of motion. No JVD present. No tracheal deviation present. No thyromegaly present.  Cardiovascular: Normal rate, regular rhythm and normal heart sounds.  Exam reveals no gallop and no friction rub.   No murmur heard. Respiratory: Effort normal. She has wheezes (bilaterally) in the right upper field and the left upper  field.  GI: Soft. Bowel sounds are normal. She exhibits no distension. There is no tenderness.  Lymphadenopathy:    She has no cervical adenopathy.  Neurological: She is alert and oriented to person, place, and time. No cranial nerve deficit. She exhibits normal muscle tone.  Skin: Skin is warm and dry. No rash noted. No erythema.  Psychiatric: She has a normal mood and affect. Her behavior is normal. Judgment and thought content normal.     Assessment/Plan This is a 49 year old Caucasian female admitted for reactive airway syndrome. 1. Reactive airways: The patient has no underlying lung disease but has significant wheezing and tachypnea. She has failed  outpatient treatment with steroids and bronchial agonist. No pneumonia seen on chest x-ray. The patient is not hypoxic. She's been given Levaquin IV in the emergency department which I will continue orally for anti-inflammatory effect. Continue duonebs as needed. Perhaps anticholinergic will help with tachypnea. I will continue a steroid taper as well. 2. GERD: Continue PPI 3. Obesity: BMI is 35.3; encourage healthy diet and exercise 4. DVT prophylaxis: Heparin 5. GI prophylaxis: As above The patient is a full code. Time spent on admission orders and patient care approximately 45 minutes   Harrie Foreman 10/14/2015, 12:10 PM

## 2015-10-14 NOTE — ED Notes (Signed)
Unit [facility] called to check status of ready bed.

## 2015-10-15 MED ORDER — BENZONATATE 100 MG PO CAPS
200.0000 mg | ORAL_CAPSULE | Freq: Three times a day (TID) | ORAL | Status: DC
Start: 2015-10-15 — End: 2015-10-16
  Administered 2015-10-15 – 2015-10-16 (×4): 200 mg via ORAL
  Filled 2015-10-15 (×4): qty 2

## 2015-10-15 MED ORDER — ENOXAPARIN SODIUM 40 MG/0.4ML ~~LOC~~ SOLN
40.0000 mg | SUBCUTANEOUS | Status: DC
  Administered 2015-10-15 – 2015-10-16 (×2): 40 mg via SUBCUTANEOUS
  Filled 2015-10-15 (×2): qty 0.4

## 2015-10-15 MED ORDER — METHYLPREDNISOLONE SODIUM SUCC 125 MG IJ SOLR
60.0000 mg | Freq: Three times a day (TID) | INTRAMUSCULAR | Status: DC
  Administered 2015-10-15 – 2015-10-16 (×4): 60 mg via INTRAVENOUS
  Filled 2015-10-15 (×4): qty 2

## 2015-10-15 MED ORDER — HYDROCOD POLST-CPM POLST ER 10-8 MG/5ML PO SUER
5.0000 mL | Freq: Two times a day (BID) | ORAL | Status: DC
  Administered 2015-10-15 – 2015-10-16 (×3): 5 mL via ORAL
  Filled 2015-10-15 (×3): qty 5

## 2015-10-15 MED ORDER — GUAIFENESIN-CODEINE 100-10 MG/5ML PO SOLN: 10.0000 mL | Freq: Four times a day (QID) | ORAL | Status: DC | PRN

## 2015-10-15 NOTE — Progress Notes (Signed)
[facility] - [place] at [facility]   PATIENT NAME: Nancy Blake    MR#:  [id number]  DATE OF BIRTH:  [birth year]  SUBJECTIVE:  CHIEF COMPLAINT:   Chief Complaint  Patient presents with  . Shortness of Breath   - Patient admitted for recurrent wheezing for the last month and worsened breathing. No underlying diagnosis of asthma or COPD -Feeling Some better than yesterday  REVIEW OF SYSTEMS:  Review of Systems  Constitutional: Negative for fever and chills.  HENT: Negative for ear discharge, ear pain, nosebleeds and tinnitus.   Respiratory: Positive for cough, shortness of breath and wheezing.   Cardiovascular: Negative for chest pain, palpitations and leg swelling.  Gastrointestinal: Negative for nausea, vomiting, abdominal pain, diarrhea and constipation.  Genitourinary: Negative for dysuria.  Musculoskeletal: Negative for myalgias and back pain.  Neurological: Positive for weakness. Negative for dizziness, sensory change, speech change, focal weakness, seizures and headaches.  Psychiatric/Behavioral: Negative for depression.    DRUG ALLERGIES:  No Known Allergies  VITALS:  Blood pressure 128/73, pulse 105, temperature 98.1 F (36.7 C), temperature source Oral, resp. rate 18, height  (1.651 m), weight 99.565 kg (219 lb 8 oz), SpO2 97 %.  PHYSICAL EXAMINATION:  Physical Exam  GENERAL:  49 y.o.-year-old obese patient lying in the bed with no acute distress.  EYES: Pupils equal, round, reactive to light and accommodation. No scleral icterus. Extraocular muscles intact.  HEENT: Head atraumatic, normocephalic. Oropharynx and nasopharynx clear.  NECK:  Supple, no jugular venous distention. No thyroid enlargement, no tenderness.  LUNGS: Diffuse scattered wheezing bilaterally, decreased bibasilar breath sounds. No crackles or Rales or rhonchi.. No use of accessory muscles of respiration.  CARDIOVASCULAR: S1, S2 normal. No murmurs, rubs, or  gallops.  ABDOMEN: Soft, nontender, nondistended. Bowel sounds present. No organomegaly or mass.  EXTREMITIES: No pedal edema, cyanosis, or clubbing.  NEUROLOGIC: Cranial nerves II through XII are intact. Muscle strength 5/5 in all extremities. Sensation intact. Gait not checked.  PSYCHIATRIC: The patient is alert and oriented x 3.  SKIN: No obvious rash, lesion, or ulcer.    LABORATORY PANEL:   CBC  Recent Labs Lab 10/14/15 0931  WBC 14.4*  HGB 13.4  HCT 40.7  PLT 271   ------------------------------------------------------------------------------------------------------------------  Chemistries   Recent Labs Lab 10/14/15 0931  NA 134*  K 3.8  CL 98*  CO2 28  GLUCOSE 157*  BUN 11  CREATININE 0.79  CALCIUM 8.7*   ------------------------------------------------------------------------------------------------------------------  Cardiac Enzymes  Recent Labs Lab 10/14/15 0931  TROPONINI <0.03   ------------------------------------------------------------------------------------------------------------------  RADIOLOGY:  Dg Chest 1 View  10/14/2015  CLINICAL DATA:  Shortness of breath during exam with cough since Monday. EXAM: CHEST 1 VIEW COMPARISON:  October 09, 2015 FINDINGS: The heart size and mediastinal contours are within normal limits. Both lungs are clear. The visualized skeletal structures are unremarkable. IMPRESSION: No active cardiopulmonary disease. Electronically Signed   By: Sherian Rein M.D.   On: 10/14/2015 09:49    EKG:   Orders placed or performed during the hospital encounter of 10/14/15  . ED EKG  . ED EKG    ASSESSMENT AND PLAN:   49 year old female with no significant past medical history presents to the hospital secondary to worsening bronchitis and failing outpatient treatment.   #1 acute bronchitis with reactive airway disease-failed outpatient treatment. -Was on Z-Pak as outpatient, also prednisone taper with recurrent  wheezing and worsening cough. -Chest x-ray with no acute findings. -Continue IV steroids, a chest  cough medicines. -Continue Levaquin for bronchitis. -Nebulizer treatment while in the hospital. Continue dulera -Not needing oxygen for now. Ambulate and check oxygen requirements.  #2 leukocytosis-likely from bronchitis and also being on steroids recently. -Continue antibiotics.  #3 GERD-on protonix  #4 DVT prophylaxis-changed to Lovenox   All the records are reviewed and case discussed with Care Management/Social Workerr. Management plans discussed with the patient, family and they are in agreement.  CODE STATUS: Full Code  TOTAL TIME TAKING CARE OF THIS PATIENT: 37 minutes.   POSSIBLE D/C IN 2-3 DAYS, DEPENDING ON CLINICAL CONDITION.   Enid BaasKALISETTI,Alysiana Ethridge M.D on 10/15/2015 at 8:45 AM  Between 7am to 6pm - Pager - [phone]  After 6pm go to [email or web address] - password [email or web address]  [facility]  Office  [phone]  CC: Primary care physician; Lorie PhenixNancy Maloney, MD

## 2015-10-16 DIAGNOSIS — J209 Acute bronchitis, unspecified: Secondary | ICD-10-CM

## 2015-10-16 MED ORDER — BUDESONIDE-FORMOTEROL FUMARATE 80-4.5 MCG/ACT IN AERO: 2.0000 | INHALATION_SPRAY | Freq: Two times a day (BID) | RESPIRATORY_TRACT | Status: DC

## 2015-10-16 MED ORDER — HYDROCOD POLST-CPM POLST ER 10-8 MG/5ML PO SUER: 5.0000 mL | Freq: Two times a day (BID) | ORAL | Status: DC

## 2015-10-16 MED ORDER — LEVOFLOXACIN 500 MG PO TABS: 500.0000 mg | ORAL_TABLET | Freq: Every day | ORAL | Status: DC

## 2015-10-16 MED ORDER — BENZONATATE 200 MG PO CAPS: 200.0000 mg | ORAL_CAPSULE | Freq: Three times a day (TID) | ORAL | Status: DC

## 2015-10-16 MED ORDER — PREDNISONE 10 MG (21) PO TBPK: 10.0000 mg | ORAL_TABLET | Freq: Every day | ORAL | Status: DC

## 2015-10-16 MED ORDER — ALBUTEROL SULFATE HFA 108 (90 BASE) MCG/ACT IN AERS: 2.0000 | INHALATION_SPRAY | Freq: Four times a day (QID) | RESPIRATORY_TRACT | Status: DC | PRN

## 2015-10-16 NOTE — Discharge Summary (Signed)
[facility] - [place] at [facility]   PATIENT NAME: Nancy RenshawKimberly Blake    MR#:  [id number]  DATE OF BIRTH:  1966-05-26  DATE OF ADMISSION:  10/14/2015 ADMITTING PHYSICIAN: Arnaldo NatalMichael S Diamond, MD  DATE OF DISCHARGE: 10/16/15  PRIMARY CARE PHYSICIAN: Lorie PhenixNancy Maloney, MD    ADMISSION DIAGNOSIS:  Bronchitis [J40]  DISCHARGE DIAGNOSIS:  Principal Problem:   Reactive airway disease with acute exacerbation Active Problems:   Acute bronchitis   SECONDARY DIAGNOSIS:  History reviewed. No pertinent past medical history.  HOSPITAL COURSE:   49 year old female with no significant past medical history presents to the hospital secondary to worsening bronchitis and failing outpatient treatment.   #1 acute bronchitis with reactive airway disease exacerbation-failed outpatient treatment. -Much improving, discharge on prednisone taper and levaquin for bronchitis symptoms - continue cough meds - pulmonary referral as outpatient. -Chest x-ray with no acute findings. -will also add symbicort and albuterol prn at discharge -not requiring o2, stable sats with ambulation as well.  #2 leukocytosis-likely from bronchitis and also being on steroids recently. -Continue antibiotics.  #3 GERD-resume lansaprazole at discharge  Discharge home today  DISCHARGE CONDITIONS:   Stable  CONSULTS OBTAINED:    None  DRUG ALLERGIES:  No Known Allergies  DISCHARGE MEDICATIONS:   Current Discharge Medication List    START taking these medications   Details  !! albuterol (PROVENTIL HFA;VENTOLIN HFA) 108 (90 BASE) MCG/ACT inhaler Inhale 2 puffs into the lungs every 6 (six) hours as needed for wheezing or shortness of breath. Qty: 1 Inhaler, Refills: 2    benzonatate (TESSALON) 200 MG capsule Take 1 capsule (200 mg total) by mouth 3 (three) times daily. Qty: 30 capsule, Refills: 0    budesonide-formoterol (SYMBICORT) 80-4.5 MCG/ACT inhaler Inhale 2 puffs into the lungs 2  (two) times daily. Qty: 1 Inhaler, Refills: 2    chlorpheniramine-HYDROcodone (TUSSIONEX) 10-8 MG/5ML SUER Take 5 mLs by mouth every 12 (twelve) hours. Qty: 115 mL, Refills: 0    levofloxacin (LEVAQUIN) 500 MG tablet Take 1 tablet (500 mg total) by mouth daily. Qty: 5 tablet, Refills: 0    predniSONE (STERAPRED UNI-PAK 21 TAB) 10 MG (21) TBPK tablet Take 1 tablet (10 mg total) by mouth daily. 6 tabs PO x 2 days 5 tabs PO x 2 days 4 tabs PO x 2 days 3 tabs PO x 2 days 2 tabs PO x 2 days 1 tab PO x 2 days and stop Qty: 42 tablet, Refills: 0     !! - Potential duplicate medications found. Please discuss with provider.    CONTINUE these medications which have NOT CHANGED   Details  !! albuterol (PROVENTIL HFA) 108 (90 BASE) MCG/ACT inhaler Inhale 2 puffs into the lungs every 4 (four) hours as needed for wheezing or shortness of breath. Qty: 1 Inhaler, Refills: 0    ibuprofen (ADVIL,MOTRIN) 200 MG tablet Take 600 mg by mouth every 6 (six) hours as needed for mild pain or moderate pain.    lansoprazole (PREVACID) 15 MG capsule Take 15 mg by mouth daily at 12 noon.    nystatin (MYCOSTATIN) 100000 UNIT/ML suspension Take 5 mLs (500,000 Units total) by mouth 4 (four) times daily. Qty: 60 mL, Refills: 0   Associated Diagnoses: Oral candidiasis     !! - Potential duplicate medications found. Please discuss with provider.    STOP taking these medications     HYDROcodone-homatropine (HYCODAN) 5-1.5 MG/5ML syrup          DISCHARGE INSTRUCTIONS:  1. PCP f/u in 2 weeks 2. Pulmonary referral for 6 weeks  If you experience worsening of your admission symptoms, develop shortness of breath, life threatening emergency, suicidal or homicidal thoughts you must seek medical attention immediately by calling 911 or calling your MD immediately  if symptoms less severe.  You Must read complete instructions/literature along with all the possible adverse reactions/side effects for all the  Medicines you take and that have been prescribed to you. Take any new Medicines after you have completely understood and accept all the possible adverse reactions/side effects.   Please note  You were cared for by a hospitalist during your hospital stay. If you have any questions about your discharge medications or the care you received while you were in the hospital after you are discharged, you can call the unit and asked to speak with the hospitalist on call if the hospitalist that took care of you is not available. Once you are discharged, your primary care physician will handle any further medical issues. Please note that NO REFILLS for any discharge medications will be authorized once you are discharged, as it is imperative that you return to your primary care physician (or establish a relationship with a primary care physician if you do not have one) for your aftercare needs so that they can reassess your need for medications and monitor your lab values.    Today   CHIEF COMPLAINT:   Chief Complaint  Patient presents with  . Shortness of Breath    VITAL SIGNS:  Blood pressure 145/70, pulse 82, temperature 97.7 F (36.5 C), temperature source Oral, resp. rate 18, height  (1.651 m), weight 95.845 kg (211 lb 4.8 oz), SpO2 92 %.  I/O:   Intake/Output Summary (Last 24 hours) at 10/16/15 0901 Last data filed at 10/15/15 1700  Gross per 24 hour  Intake    480 ml  Output      0 ml  Net    480 ml    PHYSICAL EXAMINATION:   Physical Exam  GENERAL: 49 y.o.-year-old obese patient lying in the bed with no acute distress.  EYES: Pupils equal, round, reactive to light and accommodation. No scleral icterus. Extraocular muscles intact.  HEENT: Head atraumatic, normocephalic. Oropharynx and nasopharynx clear.  NECK: Supple, no jugular venous distention. No thyroid enlargement, no tenderness.  LUNGS: moving air bilaterally, no wheezing noted, still some tightness and scant  breath sounds left base., decreased bibasilar breath sounds. No crackles or Rales or rhonchi.. No use of accessory muscles of respiration.  CARDIOVASCULAR: S1, S2 normal. No murmurs, rubs, or gallops.  ABDOMEN: Soft, nontender, nondistended. Bowel sounds present. No organomegaly or mass.  EXTREMITIES: No pedal edema, cyanosis, or clubbing.  NEUROLOGIC: Cranial nerves II through XII are intact. Muscle strength 5/5 in all extremities. Sensation intact. Gait not checked.  PSYCHIATRIC: The patient is alert and oriented x 3.  SKIN: No obvious rash, lesion, or ulcer.   DATA REVIEW:   CBC  Recent Labs Lab 10/14/15 0931  WBC 14.4*  HGB 13.4  HCT 40.7  PLT 271    Chemistries   Recent Labs Lab 10/14/15 0931  NA 134*  K 3.8  CL 98*  CO2 28  GLUCOSE 157*  BUN 11  CREATININE 0.79  CALCIUM 8.7*    Cardiac Enzymes  Recent Labs Lab 10/14/15 0931  TROPONINI <0.03    Microbiology Results  No results found for this or any previous visit.  RADIOLOGY:  Dg Chest 1 View  10/14/2015  CLINICAL DATA:  Shortness of breath during exam with cough since Monday. EXAM: CHEST 1 VIEW COMPARISON:  October 09, 2015 FINDINGS: The heart size and mediastinal contours are within normal limits. Both lungs are clear. The visualized skeletal structures are unremarkable. IMPRESSION: No active cardiopulmonary disease. Electronically Signed   By: Sherian Rein M.D.   On: 10/14/2015 09:49    EKG:   Orders placed or performed during the hospital encounter of 10/14/15  . ED EKG  . ED EKG      Management plans discussed with the patient, family and they are in agreement.  CODE STATUS:     Code Status Orders        Start     Ordered   10/14/15 1424  Full code   Continuous     10/14/15 1423      TOTAL TIME TAKING CARE OF THIS PATIENT: 38 minutes.    Enid Baas M.D on 10/16/2015 at 9:01 AM  Between 7am to 6pm - Pager - [phone]  After 6pm go to [email or web address] -  password [email or web address]  [place]  [facility]  Office  [phone]  CC: Primary care physician; Lorie Phenix, MD

## 2015-10-16 NOTE — Progress Notes (Signed)
Pt being discharged today. Discharge instructions, doctor's note and prescriptions given to pt. She verbalized understanding of all instructions. IV removed.

## 2015-10-16 NOTE — Progress Notes (Signed)
Pt rolled out in wheelchair by staff 

## 2015-10-16 NOTE — Progress Notes (Signed)
     Nancy RenshawKimberly Blake was admitted to the [facility] on 10/14/2015 for an acute medical condition and is being Discharged on  10/16/2015 . She is still recovering now and will on treatment for at least another 7 days. She is advised to stay away from work until then. So please excuse her from work for the above days. She should be able to return to work without any restrictions from 10/24/15.  Call [name]  MD, [facility] at  [phone] with questions.  Enid BaasKALISETTI,[name] M.D on 10/16/2015,at 9:05 AM  [facility] [address], [place] [id number]

## 2015-10-17 ENCOUNTER — Telehealth: Payer: Self-pay

## 2015-10-17 NOTE — Telephone Encounter (Signed)
Patient called. She was release from hospital for bronchitis today. She was told to make appt and have referral to pulmonologist, appt made for December 30th-aa

## 2015-10-20 ENCOUNTER — Ambulatory Visit (INDEPENDENT_AMBULATORY_CARE_PROVIDER_SITE_OTHER): Payer: 59 | Admitting: Family Medicine

## 2015-10-20 ENCOUNTER — Encounter: Payer: Self-pay | Admitting: Family Medicine

## 2015-10-20 VITALS — BP 132/80 | HR 82 | Temp 98.7°F | Resp 18 | Wt 229.0 lb

## 2015-10-20 DIAGNOSIS — J209 Acute bronchitis, unspecified: Secondary | ICD-10-CM | POA: Diagnosis not present

## 2015-10-20 DIAGNOSIS — B370 Candidal stomatitis: Secondary | ICD-10-CM | POA: Diagnosis not present

## 2015-10-20 DIAGNOSIS — J45901 Unspecified asthma with (acute) exacerbation: Secondary | ICD-10-CM

## 2015-10-20 MED ORDER — NYSTATIN 100000 UNIT/ML MT SUSP: 5.0000 mL | Freq: Four times a day (QID) | OROMUCOSAL | Status: DC

## 2015-10-20 NOTE — Progress Notes (Signed)
Patient ID: Nancy Blake, female   DOB: 06/25/1966, 49 y.o.   MRN: [id number]       Patient: Nancy Blake Female    DOB: 06/25/1966   49 y.o.   MRN: [id number] Visit Date: 10/20/2015  Today's Provider: Lorie PhenixNancy Hiawatha Merriott, MD   Chief Complaint  Patient presents with  . Hospitalization Follow-up  . Cough   Subjective:    HPI Hospital Follow up  [facility] due to bronchitis and shortness of breath. Patient reports that her breathing has mildly improved. However, she still has a bad cough. Cough Patient reports that she has had bronchitis for almost 4 weeks. She currently takes Symbicort twice a day, and has been tolerating medication well. She is also still on the Prednisone. Her cough is worse at night, but Tussionex seems to help with sleep. Patient denies any fever. She also mentions that ER physician has referred her to pulmonology for further evaluation. She has an appt scheduled with them at the end of January.     No Known Allergies Previous Medications   ALBUTEROL (PROVENTIL HFA) 108 (90 BASE) MCG/ACT INHALER    Inhale 2 puffs into the lungs every 4 (four) hours as needed for wheezing or shortness of breath.   ALBUTEROL (PROVENTIL HFA;VENTOLIN HFA) 108 (90 BASE) MCG/ACT INHALER    Inhale 2 puffs into the lungs every 6 (six) hours as needed for wheezing or shortness of breath.   BENZONATATE (TESSALON) 200 MG CAPSULE    Take 1 capsule (200 mg total) by mouth 3 (three) times daily.   BUDESONIDE-FORMOTEROL (SYMBICORT) 80-4.5 MCG/ACT INHALER    Inhale 2 puffs into the lungs 2 (two) times daily.   CHLORPHENIRAMINE-HYDROCODONE (TUSSIONEX) 10-8 MG/5ML SUER    Take 5 mLs by mouth every 12 (twelve) hours.   IBUPROFEN (ADVIL,MOTRIN) 200 MG TABLET    Take 600 mg by mouth every 6 (six) hours as needed for mild pain or moderate pain.   LANSOPRAZOLE (PREVACID) 15 MG CAPSULE    Take 15 mg by mouth daily at 12 noon.   LEVOFLOXACIN (LEVAQUIN) 500 MG TABLET     Take 1 tablet (500 mg total) by mouth daily.   NYSTATIN (MYCOSTATIN) 100000 UNIT/ML SUSPENSION    Take 5 mLs (500,000 Units total) by mouth 4 (four) times daily.   PREDNISONE (STERAPRED UNI-PAK 21 TAB) 10 MG (21) TBPK TABLET    Take 1 tablet (10 mg total) by mouth daily. 6 tabs PO x 2 days 5 tabs PO x 2 days 4 tabs PO x 2 days 3 tabs PO x 2 days 2 tabs PO x 2 days 1 tab PO x 2 days and stop    Review of Systems  Constitutional: Positive for activity change and fatigue.  HENT: Positive for congestion, postnasal drip and sore throat.   Respiratory: Positive for cough, shortness of breath and wheezing.   Cardiovascular: Negative.     Social History  Substance Use Topics  . Smoking status: Former Smoker    Quit date: 10/21/1983  . Smokeless tobacco: Never Used  . Alcohol Use: No   Objective:   BP 132/80 mmHg  Pulse 82  Temp(Src) 98.7 F (37.1 C)  Resp 18  Wt 229 lb (103.874 kg)  SpO2 98%  Physical Exam  Constitutional: She is oriented to person, place, and time. She appears well-developed and well-nourished.  HENT:  Mouth/Throat: Oropharynx is clear and moist.  Cardiovascular: Normal rate, regular rhythm, normal heart sounds and  intact distal pulses.   Pulmonary/Chest: Effort normal and breath sounds normal.  Neurological: She is alert and oriented to person, place, and time. She has normal reflexes.  Psychiatric: She has a normal mood and affect. Her behavior is normal. Judgment and thought content normal.  Nursing note and vitals reviewed.     Assessment & Plan:     1. Reactive airway disease with acute exacerbation Improved. Finish taper. Filled out paper work to be out of work until  10/30/2015. Still with fatigue, some SOB and hoarse voice.  Follow up with pulmonary as scheduled.    2. Acute bronchitis, unspecified organism Stable  Improved. Finish antibiotic.     3. Oral candidiasis Improved but will treat for another week.   - nystatin (MYCOSTATIN) 100000  UNIT/ML suspension; Take 5 mLs (500,000 Units total) by mouth 4 (four) times daily.  Dispense: 60 mL; Refill: 0    Patient was seen and examined by Leo Grosser, MD, and scribed by Anson [place], CMA.  I have reviewed the document for accuracy and completeness and I agree with above. - Leo Grosser, MD        Lorie Phenix, MD  [facility]

## 2015-10-26 ENCOUNTER — Telehealth: Payer: Self-pay | Admitting: Family Medicine

## 2015-10-26 NOTE — Telephone Encounter (Signed)
Ok to write work note? Please advise. Thanks!  

## 2015-10-26 NOTE — Telephone Encounter (Signed)
Wrote note for Dr. Elease HashimotoMaloney to sign, and informed pt. Allene DillonEmily Drozdowski, CMA

## 2015-10-26 NOTE — Telephone Encounter (Signed)
Pt would like to pick up a note by tomorrow that states that she has been under Dr. Santiago BurMaloney's care and she can return to work on Monday 11/09/15 full duty. Please advise. Thanks TNP

## 2015-10-26 NOTE — Telephone Encounter (Signed)
Ok to write note   Thanks

## 2015-11-09 ENCOUNTER — Encounter: Payer: Self-pay | Admitting: Family Medicine

## 2015-11-09 ENCOUNTER — Ambulatory Visit (INDEPENDENT_AMBULATORY_CARE_PROVIDER_SITE_OTHER): Payer: 59 | Admitting: Family Medicine

## 2015-11-09 VITALS — BP 102/70 | HR 112 | Temp 98.3°F | Resp 20 | Ht 65.0 in | Wt 237.0 lb

## 2015-11-09 DIAGNOSIS — Z1211 Encounter for screening for malignant neoplasm of colon: Secondary | ICD-10-CM

## 2015-11-09 DIAGNOSIS — Z0000 Encounter for general adult medical examination without abnormal findings: Secondary | ICD-10-CM

## 2015-11-09 DIAGNOSIS — Z124 Encounter for screening for malignant neoplasm of cervix: Secondary | ICD-10-CM | POA: Diagnosis not present

## 2015-11-09 DIAGNOSIS — Z1239 Encounter for other screening for malignant neoplasm of breast: Secondary | ICD-10-CM | POA: Diagnosis not present

## 2015-11-09 NOTE — Progress Notes (Signed)
Patient ID: Nancy Blake, female   DOB: [birth year], 50 y.o.   MRN: [id number]       Patient: Nancy Blake, Female    DOB: [birth year], 50 y.o.   MRN: [id number] Visit Date: 11/09/2015  Today's Provider: Lorie Phenix, MD   Chief Complaint  Patient presents with  . Annual Exam   Subjective:    Annual physical exam Nancy Blake is a 50 y.o. female who presents today for health maintenance and complete physical. She feels well. She reports exercising none. She reports she is sleeping fairly well. 10/10/11CPE 08-06-10 Mammo-BI-RADS 2 07-05-09 Pap-neg HPV-neg  -----------------------------------------------------------------   Review of Systems  Constitutional: Negative.   HENT: Positive for voice change.   Eyes: Negative.   Respiratory: Positive for cough and shortness of breath.   Cardiovascular: Negative.   Gastrointestinal: Negative.   Endocrine: Negative.   Genitourinary: Negative.   Musculoskeletal: Negative.   Skin: Negative.   Allergic/Immunologic: Positive for environmental allergies.  Neurological: Negative.   Hematological: Negative.   Psychiatric/Behavioral: Negative.     Social History      She  reports that she quit smoking about 32 years ago. She has never used smokeless tobacco. She reports that she does not drink alcohol or use illicit drugs.       Social History   Social History  . Marital Status: Married    Spouse Name: N/A  . Number of Children: N/A  . Years of Education: N/A   Social History Main Topics  . Smoking status: Former Smoker    Quit date: 10/21/1983  . Smokeless tobacco: Never Used  . Alcohol Use: No  . Drug Use: No  . Sexual Activity: Not Asked   Other Topics Concern  . None   Social History Narrative    History reviewed. No pertinent past medical history.   Patient Active Problem List   Diagnosis Date Noted  . Acute bronchitis 10/16/2015  . Reactive airway disease with acute exacerbation 10/14/2015    . Absence of menstruation 10/12/2015  . Allergic rhinitis 04/01/2009  . Accumulation of fluid in tissues 04/01/2009  . Acid reflux 03/31/2009    Past Surgical History  Procedure Laterality Date  . Appendectomy    . Abdominal hysterectomy    . Tubal ligation  1995    Family History        Family Status  Relation Status Death Age  . Mother Alive   . Father Alive   . Brother Alive   . Maternal Grandmother Deceased   . Maternal Grandfather Deceased   . Paternal Grandfather Deceased         Her family history includes CAD in her mother; COPD in her maternal grandfather; Healthy in her brother and father; Lung cancer in her paternal grandfather; Osteoporosis in her maternal grandmother.    No Known Allergies  Previous Medications   ALBUTEROL (PROVENTIL HFA;VENTOLIN HFA) 108 (90 BASE) MCG/ACT INHALER    Inhale 2 puffs into the lungs every 6 (six) hours as needed for wheezing or shortness of breath.   BUDESONIDE-FORMOTEROL (SYMBICORT) 80-4.5 MCG/ACT INHALER    Inhale 2 puffs into the lungs 2 (two) times daily.   IBUPROFEN (ADVIL,MOTRIN) 200 MG TABLET    Take 600 mg by mouth every 6 (six) hours as needed for mild pain or moderate pain.   LANSOPRAZOLE (PREVACID) 15 MG CAPSULE    Take 15 mg by mouth daily at 12 noon.    Patient Care Team: Lorie Phenix, MD  as PCP - General (Family Medicine)     Objective:   Vitals: BP 102/70 mmHg  Pulse 112  Temp(Src) 98.3 F (36.8 C) (Oral)  Resp 20  Ht  (1.651 m)  Wt 237 lb (107.502 kg)  BMI 39.44 kg/m2  SpO2 97%   Physical Exam  Constitutional: She is oriented to person, place, and time. She appears well-developed and well-nourished.  HENT:  Head: Normocephalic and atraumatic.  Right Ear: Tympanic membrane, external ear and ear canal normal.  Left Ear: Tympanic membrane, external ear and ear canal normal.  Nose: Nose normal.  Mouth/Throat: Uvula is midline, oropharynx is clear and moist and mucous membranes are normal.  Eyes:  Conjunctivae, EOM and lids are normal. Pupils are equal, round, and reactive to light.  Neck: Trachea normal and normal range of motion. Neck supple. Carotid bruit is not present. No thyroid mass and no thyromegaly present.  Cardiovascular: Normal rate, regular rhythm and normal heart sounds.   Pulmonary/Chest: Effort normal and breath sounds normal.  Abdominal: Soft. Normal appearance and bowel sounds are normal. There is no hepatosplenomegaly. There is no tenderness.  Genitourinary: Vagina normal. No breast swelling, tenderness or discharge.  Musculoskeletal: Normal range of motion.  Lymphadenopathy:    She has no cervical adenopathy.    She has no axillary adenopathy.  Neurological: She is alert and oriented to person, place, and time. She has normal strength. No cranial nerve deficit.  Skin: Skin is warm, dry and intact.  Psychiatric: She has a normal mood and affect. Her speech is normal and behavior is normal. Judgment and thought content normal. Cognition and memory are normal.     Depression Screen PHQ 2/9 Scores 11/09/2015  PHQ - 2 Score 0      Assessment & Plan:     Routine Health Maintenance and Physical Exam  Exercise Activities and Dietary recommendations Goals    None        1. Annual physical exam Stable. Patient advised to continue eating healthy and exercise daily. - CBC with Differential/Platelet - Comprehensive metabolic panel - Hemoglobin A1c - Lipid Panel With LDL/HDL Ratio - TSH - VITAMIN D 25 Hydroxy (Vit-D Deficiency, Fractures)  2. Cervical cancer screening - Pap IG and HPV (high risk) DNA detection  3. Breast cancer screening - MM Digital Diagnostic Bilat; Future  4. Colon cancer screening - Ambulatory referral to Gastroenterology     Patient seen and examined by Dr. Leo Grosser, and note scribed by Liz Beach. Dimas, CMA.  I have reviewed the document for accuracy and completeness and I agree with above. Leo Grosser, MD    Lorie Phenix, MD    --------------------------------------------------------------------

## 2015-11-14 ENCOUNTER — Telehealth: Payer: Self-pay | Admitting: Family Medicine

## 2015-11-14 DIAGNOSIS — Z1239 Encounter for other screening for malignant neoplasm of breast: Secondary | ICD-10-CM

## 2015-11-14 NOTE — Telephone Encounter (Signed)
There is an order in [facility] for diagnostic mammogram but diagnosis is screening.Is this suppose to be diagnostic or screening ?

## 2015-11-14 NOTE — Telephone Encounter (Signed)
Screening. Please check as to why these have all been diagnostic. Thanks.

## 2015-11-15 ENCOUNTER — Telehealth: Payer: Self-pay

## 2015-11-15 LAB — PAP IG AND HPV HIGH-RISK
HPV, high-risk: NEGATIVE
PAP Smear Comment: 0

## 2015-11-15 NOTE — Telephone Encounter (Signed)
Order has been fixed.   Thanks,   -Laura  

## 2015-11-15 NOTE — Telephone Encounter (Signed)
-----   Message from Lorie Phenix, MD sent at 11/15/2015  8:43 AM EST ----- Pap is normal. Please notify patient.   Thanks.

## 2015-11-15 NOTE — Telephone Encounter (Signed)
Advised pt of lab results. Pt verbally acknowledges understanding. Emily Drozdowski, CMA   

## 2015-11-21 ENCOUNTER — Ambulatory Visit (INDEPENDENT_AMBULATORY_CARE_PROVIDER_SITE_OTHER): Payer: 59 | Admitting: Internal Medicine

## 2015-11-21 ENCOUNTER — Encounter: Payer: Self-pay | Admitting: Internal Medicine

## 2015-11-21 VITALS — BP 126/68 | HR 107 | Ht 64.0 in | Wt 234.2 lb

## 2015-11-21 DIAGNOSIS — J45909 Unspecified asthma, uncomplicated: Secondary | ICD-10-CM

## 2015-11-21 MED ORDER — BUDESONIDE-FORMOTEROL FUMARATE 80-4.5 MCG/ACT IN AERO: 2.0000 | INHALATION_SPRAY | Freq: Two times a day (BID) | RESPIRATORY_TRACT | Status: DC

## 2015-11-21 NOTE — Progress Notes (Signed)
[facility] Pulmonary Medicine Consultation      Date: 11/21/2015,   MRN# [id number] Nancy Blake [facility] 01-04-1966 Code Status:  Code Status History    Date Active Date Inactive Code Status Order ID Comments User Context   10/14/2015  2:23 PM 10/16/2015  1:29 PM Full Code [id number]  Arnaldo Natal, MD Inpatient     Hosp day:@LENGTHOFSTAYDAYS @ Referring MD: @     PCP:      AdmissionWeight: 234 lb 3.2 oz (106.232 kg)                 CurrentWeight: 234 lb 3.2 oz (106.232 kg) Nancy Blake is a 50 y.o. old female seen in consultation for wheezing at the request of Dr. Elease Hashimoto     CHIEF COMPLAINT:  Patient with wheezing and cough and SOB    HISTORY OF PRESENT ILLNESS   50 yo white female seen today for wheezing and SOB and cough for last 2 months. Its started with cough and chest congestion with wheezing and SOB Patient went to urgent care and was dx with bronchitis  Over the next few weeks after her onset of symptoms, her SOB and wheezing progressively got worse and was admitted to hospital During hospital stay, patient given neb therapy and steroids and was sent home with 2 week prednisone taper Patient missed 2 weeks of work due to extreme fatigue and her resp symptoms Patient was given inhaled steroids/LABA(symbicort) and cough suppressant. Patient had oral thrush as well At thist ime, patient states that smoke and perfumes affect her breathing She has associated symptoms of allerghic rhinitis and watery eyes Patient feels much better since taking steroids  Her wheezing and SOB have resolved   PAST MEDICAL HISTORY   Past Medical History  Diagnosis Date  . Allergic rhinitis   . Pneumonia      SURGICAL HISTORY   Past Surgical History  Procedure Laterality Date  . Appendectomy    . Abdominal hysterectomy    . Tubal ligation  1995     FAMILY HISTORY   Family History  Problem Relation Age of Onset  . CAD Mother   . Healthy Father   .  Healthy Brother   . Osteoporosis Maternal Grandmother   . COPD Maternal Grandfather   . Lung cancer Paternal Grandfather      SOCIAL HISTORY   Social History  Substance Use Topics  . Smoking status: Former Smoker    Quit date: 10/21/1983  . Smokeless tobacco: Never Used  . Alcohol Use: No     MEDICATIONS    Home Medication:  Current Outpatient Rx  Name  Route  Sig  Dispense  Refill  . albuterol (PROVENTIL HFA;VENTOLIN HFA) 108 (90 BASE) MCG/ACT inhaler   Inhalation   Inhale 2 puffs into the lungs every 6 (six) hours as needed for wheezing or shortness of breath.   1 Inhaler   2   . budesonide-formoterol (SYMBICORT) 80-4.5 MCG/ACT inhaler   Inhalation   Inhale 2 puffs into the lungs 2 (two) times daily.   1 Inhaler   2   . fexofenadine (ALLEGRA) 180 MG tablet   Oral   Take 180 mg by mouth daily.         Marland Kitchen ibuprofen (ADVIL,MOTRIN) 200 MG tablet   Oral   Take 600 mg by mouth every 6 (six) hours as needed for mild pain or moderate pain.         Marland Kitchen lansoprazole (PREVACID) 15 MG capsule  Oral   Take 15 mg by mouth daily at 12 noon.         . Multiple Vitamin (MULTIVITAMIN) tablet   Oral   Take 1 tablet by mouth daily.           Current Medication:  Current outpatient prescriptions:  .  albuterol (PROVENTIL HFA;VENTOLIN HFA) 108 (90 BASE) MCG/ACT inhaler, Inhale 2 puffs into the lungs every 6 (six) hours as needed for wheezing or shortness of breath., Disp: 1 Inhaler, Rfl: 2 .  budesonide-formoterol (SYMBICORT) 80-4.5 MCG/ACT inhaler, Inhale 2 puffs into the lungs 2 (two) times daily., Disp: 1 Inhaler, Rfl: 2 .  fexofenadine (ALLEGRA) 180 MG tablet, Take 180 mg by mouth daily., Disp: , Rfl:  .  ibuprofen (ADVIL,MOTRIN) 200 MG tablet, Take 600 mg by mouth every 6 (six) hours as needed for mild pain or moderate pain., Disp: , Rfl:  .  lansoprazole (PREVACID) 15 MG capsule, Take 15 mg by mouth daily at 12 noon., Disp: , Rfl:  .  Multiple Vitamin  (MULTIVITAMIN) tablet, Take 1 tablet by mouth daily., Disp: , Rfl:     ALLERGIES   Review of patient's allergies indicates no known allergies.     REVIEW OF SYSTEMS   Review of Systems  Constitutional: Negative for fever, chills and weight loss.  HENT: Positive for congestion. Negative for hearing loss.   Eyes: Negative for blurred vision and double vision.  Respiratory: Positive for cough, shortness of breath and wheezing. Negative for hemoptysis and sputum production.   Cardiovascular: Negative for chest pain and palpitations.  Gastrointestinal: Negative for heartburn, nausea, vomiting and abdominal pain.  Genitourinary: Negative for dysuria and urgency.  Musculoskeletal: Negative for myalgias, back pain and neck pain.  Skin: Negative for rash.  Neurological: Negative for dizziness, tingling, tremors and headaches.  Endo/Heme/Allergies: Does not bruise/bleed easily.  Psychiatric/Behavioral: Negative for depression. The patient is not nervous/anxious.   All other systems reviewed and are negative.    VS: BP 126/68 mmHg  Pulse 107  Ht  (1.626 m)  Wt 234 lb 3.2 oz (106.232 kg)  BMI 40.18 kg/m2  SpO2 97%     PHYSICAL EXAM  Physical Exam  Constitutional: She is oriented to person, place, and time. She appears well-developed and well-nourished. No distress.  HENT:  Head: Normocephalic and atraumatic.  Mouth/Throat: No oropharyngeal exudate.  Eyes: EOM are normal. Pupils are equal, round, and reactive to light. No scleral icterus.  Neck: Normal range of motion. Neck supple.  Cardiovascular: Normal rate, regular rhythm and normal heart sounds.   No murmur heard. Pulmonary/Chest: No stridor. No respiratory distress. She has no wheezes. She has no rales.  Abdominal: Soft. Bowel sounds are normal.  Musculoskeletal: Normal range of motion. She exhibits no edema.  Neurological: She is alert and oriented to person, place, and time. Coordination normal.  Skin: Skin is  warm. She is not diaphoretic.  Psychiatric: She has a normal mood and affect.        IMAGING   CXR on 10/14/15 images reviewed   ASSESSMENT/PLAN   50 yo white female seen today for signs and symptoms of allerghic rhinitis with reactive ariway disease c/w ASTHMA Patient will need PFT for further assessment of resp function  1.advised to continue Symbicort  And take as prescribed 2 puffs twice daily 2.albuterol as needed 3.start flonase 4.advised to avoid all triggers 5.continue allegra daily 6.continue PPI 7.PFT's and 6 MWT  Follow up in 1 month   I have  personally obtained a history, examined the patient, evaluated laboratory and independently reviewed imaging results, formulated the assessment and plan and placed orders.  The Patient requires high complexity decision making for assessment and support, frequent evaluation and titration of therapies, application of advanced monitoring technologies and extensive interpretation of multiple databases.   Patient satisfied with Plan of action and management. All questions answered  Lucie Leather, M.D.  [facility]  Medical Director [facility] Medical Director [facility] Cardio-Pulmonary Department

## 2015-11-21 NOTE — Patient Instructions (Signed)
Bronchospasm, Adult  A bronchospasm is a spasm or tightening of the airways going into the lungs. During a bronchospasm breathing becomes more difficult because the airways get smaller. When this happens there can be coughing, a whistling sound when breathing (wheezing), and difficulty breathing. Bronchospasm is often associated with asthma, but not all patients who experience a bronchospasm have asthma.  CAUSES   A bronchospasm is caused by inflammation or irritation of the airways. The inflammation or irritation may be triggered by:   · Allergies (such as to animals, pollen, food, or mold). Allergens that cause bronchospasm may cause wheezing immediately after exposure or many hours later.    · Infection. Viral infections are believed to be the most common cause of bronchospasm.    · Exercise.    · Irritants (such as pollution, cigarette smoke, strong odors, aerosol sprays, and paint fumes).    · Weather changes. Winds increase molds and pollens in the air. Rain refreshes the air by washing irritants out. Cold air may cause inflammation.    · Stress and emotional upset.    SIGNS AND SYMPTOMS   · Wheezing.    · Excessive nighttime coughing.    · Frequent or severe coughing with a simple cold.    · Chest tightness.    · Shortness of breath.    DIAGNOSIS   Bronchospasm is usually diagnosed through a history and physical exam. Tests, such as chest X-rays, are sometimes done to look for other conditions.  TREATMENT   · Inhaled medicines can be given to open up your airways and help you breathe. The medicines can be given using either an inhaler or a nebulizer machine.  · Corticosteroid medicines may be given for severe bronchospasm, usually when it is associated with asthma.  HOME CARE INSTRUCTIONS   · Always have a plan prepared for seeking medical care. Know when to call your health care provider and local emergency services (911 in the [place] Know where you can access local emergency care.  · Only take medicines as  directed by your health care provider.  · If you were prescribed an inhaler or nebulizer machine, ask your health care provider to explain how to use it correctly. Always use a spacer with your inhaler if you were given one.  · It is necessary to remain calm during an attack. Try to relax and breathe more slowly.   · Control your home environment in the following ways:      Change your heating and air conditioning filter at least once a month.      Limit your use of fireplaces and wood stoves.    Do not smoke and do not allow smoking in your home.      Avoid exposure to perfumes and fragrances.      Get rid of pests (such as roaches and mice) and their droppings.      Throw away plants if you see mold on them.      Keep your house clean and dust free.      Replace carpet with wood, tile, or vinyl flooring. Carpet can trap dander and dust.      Use allergy-proof pillows, mattress covers, and box spring covers.      Wash bed sheets and blankets every week in hot water and dry them in a dryer.      Use blankets that are made of polyester or cotton.      Wash hands frequently.  SEEK MEDICAL CARE IF:   · You have muscle aches.    · You have chest pain.    · The sputum changes from clear or   white to yellow, green, gray, or bloody.    · The sputum you cough up gets thicker.    · There are problems that may be related to the medicine you are given, such as a rash, itching, swelling, or trouble breathing.    SEEK IMMEDIATE MEDICAL CARE IF:   · You have worsening wheezing and coughing even after taking your prescribed medicines.    · You have increased difficulty breathing.    · You develop severe chest pain.  MAKE SURE YOU:   · Understand these instructions.  · Will watch your condition.  · Will get help right away if you are not doing well or get worse.     This information is not intended to replace advice given to you by your health care provider. Make sure you discuss any questions you have with your health care  provider.     Document Released: 10/10/2003 Document Revised: 10/28/2014 Document Reviewed: 03/29/2013  Elsevier Interactive Patient Education ©2016 [employer]

## 2015-11-23 ENCOUNTER — Other Ambulatory Visit: Payer: Self-pay

## 2015-11-23 ENCOUNTER — Telehealth: Payer: Self-pay

## 2015-11-23 NOTE — Telephone Encounter (Signed)
Gastroenterology Pre-Procedure Review  Request Date: 03/19/16 Requesting Physician: Dr. Elease Hashimoto  PATIENT REVIEW QUESTIONS: The patient responded to the following health history questions as indicated:    1. Are you having any GI issues? no 2. Do you have a personal history of Polyps? no 3. Do you have a family history of Colon Cancer or Polyps? no 4. Diabetes Mellitus? no 5. Joint replacements in the past 12 months?no 6. Major health problems in the past 3 months?yes (Bronchitits x 2 days in Dec 2016) 7. Any artificial heart valves, MVP, or defibrillator?no    MEDICATIONS & ALLERGIES:    Patient reports the following regarding taking any anticoagulation/antiplatelet therapy:   Plavix, Coumadin, Eliquis, Xarelto, Lovenox, Pradaxa, Brilinta, or Effient? no Aspirin? no  Patient confirms/reports the following medications:  Current Outpatient Prescriptions  Medication Sig Dispense Refill  . albuterol (PROVENTIL HFA;VENTOLIN HFA) 108 (90 BASE) MCG/ACT inhaler Inhale 2 puffs into the lungs every 6 (six) hours as needed for wheezing or shortness of breath. 1 Inhaler 2  . budesonide-formoterol (SYMBICORT) 80-4.5 MCG/ACT inhaler Inhale 2 puffs into the lungs 2 (two) times daily. 1 Inhaler 2  . fexofenadine (ALLEGRA) 180 MG tablet Take 180 mg by mouth daily.    Marland Kitchen ibuprofen (ADVIL,MOTRIN) 200 MG tablet Take 600 mg by mouth every 6 (six) hours as needed for mild pain or moderate pain.    Marland Kitchen lansoprazole (PREVACID) 15 MG capsule Take 15 mg by mouth daily at 12 noon.    . Multiple Vitamin (MULTIVITAMIN) tablet Take 1 tablet by mouth daily.     No current facility-administered medications for this visit.    Patient confirms/reports the following allergies:  No Known Allergies  No orders of the defined types were placed in this encounter.    AUTHORIZATION INFORMATION Primary Insurance: 1D#: Group #:  Secondary Insurance: 1D#: Group #:  SCHEDULE INFORMATION: Date:   03/19/16 Time: Location: [facility]

## 2015-11-30 LAB — COMPREHENSIVE METABOLIC PANEL
A/G RATIO: 1.8 (ref 1.1–2.5)
ALT: 22 IU/L (ref 0–32)
AST: 12 IU/L (ref 0–40)
Albumin: 4 g/dL (ref 3.5–5.5)
Alkaline Phosphatase: 108 IU/L (ref 39–117)
BUN/Creatinine Ratio: 8 — ABNORMAL LOW (ref 9–23)
BUN: 7 mg/dL (ref 6–24)
Bilirubin Total: 0.4 mg/dL (ref 0.0–1.2)
CO2: 22 mmol/L (ref 18–29)
Calcium: 9.5 mg/dL (ref 8.7–10.2)
Chloride: 103 mmol/L (ref 96–106)
Creatinine, Ser: 0.88 mg/dL (ref 0.57–1.00)
GFR, EST AFRICAN AMERICAN: 89 mL/min/{1.73_m2} (ref >59)
GFR, EST NON AFRICAN AMERICAN: 77 mL/min/{1.73_m2} (ref >59)
GLUCOSE: 165 mg/dL — AB (ref 65–99)
Globulin, Total: 2.2 g/dL (ref 1.5–4.5)
POTASSIUM: 4.8 mmol/L (ref 3.5–5.2)
Sodium: 143 mmol/L (ref 134–144)
TOTAL PROTEIN: 6.2 g/dL (ref 6.0–8.5)

## 2015-11-30 LAB — CBC WITH DIFFERENTIAL/PLATELET
BASOS ABS: 0 10*3/uL (ref 0.0–0.2)
BASOS: 0 %
EOS (ABSOLUTE): 0.1 10*3/uL (ref 0.0–0.4)
Eos: 1 %
Hematocrit: 40.3 % (ref 34.0–46.6)
Hemoglobin: 13.8 g/dL (ref 11.1–15.9)
IMMATURE GRANS (ABS): 0.1 10*3/uL (ref 0.0–0.1)
Immature Granulocytes: 1 %
LYMPHS ABS: 3.8 10*3/uL — AB (ref 0.7–3.1)
Lymphs: 39 %
MCH: 27.1 pg (ref 26.6–33.0)
MCHC: 34.2 g/dL (ref 31.5–35.7)
MCV: 79 fL (ref 79–97)
MONOS ABS: 0.4 10*3/uL (ref 0.1–0.9)
Monocytes: 4 %
NEUTROS ABS: 5.3 10*3/uL (ref 1.4–7.0)
Neutrophils: 55 %
PLATELETS: 306 10*3/uL (ref 150–379)
RBC: 5.1 x10E6/uL (ref 3.77–5.28)
RDW: 14.2 % (ref 12.3–15.4)
WBC: 9.8 10*3/uL (ref 3.4–10.8)

## 2015-11-30 LAB — TSH: TSH: 1.95 u[IU]/mL (ref 0.450–4.500)

## 2015-11-30 LAB — LIPID PANEL WITH LDL/HDL RATIO
CHOLESTEROL TOTAL: 166 mg/dL (ref 100–199)
HDL: 46 mg/dL (ref >39)
LDL CALC: 99 mg/dL (ref 0–99)
LDl/HDL Ratio: 2.2 ratio units (ref 0.0–3.2)
TRIGLYCERIDES: 105 mg/dL (ref 0–149)
VLDL CHOLESTEROL CAL: 21 mg/dL (ref 5–40)

## 2015-11-30 LAB — HEMOGLOBIN A1C
Est. average glucose Bld gHb Est-mCnc: 186 mg/dL
Hgb A1c MFr Bld: 8.1 % — ABNORMAL HIGH (ref 4.8–5.6)

## 2015-11-30 LAB — VITAMIN D 25 HYDROXY (VIT D DEFICIENCY, FRACTURES): Vit D, 25-Hydroxy: 25.5 ng/mL — ABNORMAL LOW (ref 30.0–100.0)

## 2015-12-01 ENCOUNTER — Telehealth: Payer: Self-pay

## 2015-12-01 ENCOUNTER — Telehealth: Payer: Self-pay | Admitting: Family Medicine

## 2015-12-01 NOTE — Telephone Encounter (Signed)
Pt advised and appointment scheduled for 12/07/2014 at 10:30am. Allene Dillon, CMA

## 2015-12-01 NOTE — Telephone Encounter (Signed)
-----   Message from Lorie Phenix, MD sent at 11/30/2015 10:50 AM EST ----- New diagnosis of diabetes. Needs OV to address. Thanks.

## 2015-12-07 ENCOUNTER — Ambulatory Visit
Admission: RE | Admit: 2015-12-07 | Discharge: 2015-12-07 | Disposition: A | Discharge status: Home / Self Care | Payer: 59 | Source: Ambulatory Visit | Attending: Family Medicine | Admitting: Family Medicine

## 2015-12-07 DIAGNOSIS — Z1231 Encounter for screening mammogram for malignant neoplasm of breast: Secondary | ICD-10-CM | POA: Insufficient documentation

## 2015-12-08 ENCOUNTER — Ambulatory Visit (INDEPENDENT_AMBULATORY_CARE_PROVIDER_SITE_OTHER): Payer: 59 | Admitting: Family Medicine

## 2015-12-08 ENCOUNTER — Encounter: Payer: Self-pay | Admitting: Family Medicine

## 2015-12-08 VITALS — BP 122/76 | HR 84 | Temp 98.5°F | Resp 16 | Wt 234.0 lb

## 2015-12-08 DIAGNOSIS — Z23 Encounter for immunization: Secondary | ICD-10-CM | POA: Diagnosis not present

## 2015-12-08 DIAGNOSIS — E119 Type 2 diabetes mellitus without complications: Secondary | ICD-10-CM

## 2015-12-08 LAB — POCT UA - MICROALBUMIN: Microalbumin Ur, POC: <20 mg/L

## 2015-12-08 MED ORDER — METFORMIN HCL 500 MG PO TABS: 500.0000 mg | ORAL_TABLET | Freq: Two times a day (BID) | ORAL | Status: DC

## 2015-12-08 NOTE — Progress Notes (Signed)
Subjective:    Patient ID: Nancy Blake, female    DOB: 1966-09-09, 50 y.o.   MRN: [id number]  Diabetes She presents for her initial (A1C checked 11/29/2015 and was 8.1%) diabetic visit. She has type 2 diabetes mellitus. Hypoglycemia symptoms include dizziness (occasionally). Associated symptoms include fatigue. Pertinent negatives for diabetes include no blurred vision, no chest pain, no foot paresthesias, no foot ulcerations, no polydipsia, no polyphagia, no polyuria, no visual change, no weakness and no weight loss. Risk factors for coronary artery disease include diabetes mellitus, obesity and post-menopausal. When asked about current treatments, none were reported. She is following a generally healthy (is coming off of sodas and sweet tea, is becoming "more conscious of what I eat") diet. Exercise: walks while at work. An ACE inhibitor/angiotensin II receptor blocker is not being taken.      Review of Systems  Constitutional: Positive for fatigue. Negative for weight loss.  Eyes: Negative for blurred vision.  Cardiovascular: Negative for chest pain.  Endocrine: Negative for polydipsia, polyphagia and polyuria.  Neurological: Positive for dizziness (occasionally). Negative for weakness.   BP 122/76 mmHg  Pulse 84  Temp(Src) 98.5 F (36.9 C) (Oral)  Resp 16  Wt 234 lb (106.142 kg)   Patient Active Problem List   Diagnosis Date Noted  . Acute bronchitis 10/16/2015  . Reactive airway disease with acute exacerbation 10/14/2015  . Absence of menstruation 10/12/2015  . Allergic rhinitis 04/01/2009  . Accumulation of fluid in tissues 04/01/2009  . Acid reflux 03/31/2009   Past Medical History  Diagnosis Date  . Allergic rhinitis   . Pneumonia    Current Outpatient Prescriptions on File Prior to Visit  Medication Sig  . albuterol (PROVENTIL HFA;VENTOLIN HFA) 108 (90 BASE) MCG/ACT inhaler Inhale 2 puffs into the lungs every 6 (six) hours as needed for wheezing or shortness  of breath.  . budesonide-formoterol (SYMBICORT) 80-4.5 MCG/ACT inhaler Inhale 2 puffs into the lungs 2 (two) times daily.  . fexofenadine (ALLEGRA) 180 MG tablet Take 180 mg by mouth daily.  Marland Kitchen ibuprofen (ADVIL,MOTRIN) 200 MG tablet Take 600 mg by mouth every 6 (six) hours as needed for mild pain or moderate pain.  Marland Kitchen lansoprazole (PREVACID) 15 MG capsule Take 15 mg by mouth daily at 12 noon.  . Multiple Vitamin (MULTIVITAMIN) tablet Take 1 tablet by mouth daily.   No current facility-administered medications on file prior to visit.   No Known Allergies Past Surgical History  Procedure Laterality Date  . Appendectomy    . Abdominal hysterectomy    . Tubal ligation  1995    Family History  Problem Relation Age of Onset  . CAD Mother   . Healthy Father   . Healthy Brother   . Osteoporosis Maternal Grandmother   . COPD Maternal Grandfather   . Lung cancer Paternal Grandfather   . Breast cancer Neg Hx        Objective:   Physical Exam  Constitutional: She is oriented to person, place, and time. She appears well-developed and well-nourished.  Neurological: She is alert and oriented to person, place, and time.  Psychiatric: She has a normal mood and affect. Her behavior is normal. Judgment and thought content normal.   BP 122/76 mmHg  Pulse 84  Temp(Src) 98.5 F (36.9 C) (Oral)  Resp 16  Wt 234 lb (106.142 kg)     Assessment & Plan:    1. Type 2 diabetes mellitus without complication, without long-term current use of insulin (HCC) New  diagnosis. Will follow up with eye doctor. Taught how to check sugars today . Discussed pathophysiology of Type 2 diabetes.   Will check am sugars several times a week . Also, discussed how to identify lows.  Also discussed healthy lifestyle changes. 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 Recheck ov in 6 weeks.   - POCT UA - Microalbumin - metFORMIN (GLUCOPHAGE) 500 MG tablet; Take 1 tablet (500 mg total) by mouth 2 (two) times daily with  a meal.  Dispense: 60 tablet; Refill: 5  Patient was seen and examined by Leo Grosser, MD, and note scribed by Allene Dillon, CMA. I have reviewed the document for accuracy and completeness and I agree with above. Leo Grosser, MD   Lorie Phenix, MD  - Pneumococcal polysaccharide vaccine 23-valent greater than or equal to 2yo subcutaneous/IM

## 2015-12-25 ENCOUNTER — Other Ambulatory Visit: Payer: Self-pay | Admitting: *Deleted

## 2015-12-25 DIAGNOSIS — J45909 Unspecified asthma, uncomplicated: Secondary | ICD-10-CM

## 2015-12-25 MED ORDER — BUDESONIDE-FORMOTEROL FUMARATE 80-4.5 MCG/ACT IN AERO: 2.0000 | INHALATION_SPRAY | Freq: Two times a day (BID) | RESPIRATORY_TRACT | Status: DC

## 2016-01-03 ENCOUNTER — Ambulatory Visit (INDEPENDENT_AMBULATORY_CARE_PROVIDER_SITE_OTHER): Payer: 59 | Admitting: *Deleted

## 2016-01-03 DIAGNOSIS — J45909 Unspecified asthma, uncomplicated: Secondary | ICD-10-CM

## 2016-01-03 LAB — PULMONARY FUNCTION TEST
DL/VA % pred: 97 %
DL/VA: 4.7 ml/min/mmHg/L
DLCO UNC % PRED: 95 %
DLCO unc: 23.05 ml/min/mmHg
FEF 25-75 PRE: 3.5 L/s
FEF 25-75 Post: 4.18 L/sec
FEF2575-%Change-Post: 19 %
FEF2575-%PRED-PRE: 128 %
FEF2575-%Pred-Post: 153 %
FEV1-%CHANGE-POST: 1 %
FEV1-%Pred-Post: 99 %
FEV1-%Pred-Pre: 98 %
FEV1-PRE: 2.74 L
FEV1-Post: 2.77 L
FEV1FVC-%Change-Post: 1 %
FEV1FVC-%PRED-PRE: 110 %
FEV6-%CHANGE-POST: 0 %
FEV6-%PRED-POST: 90 %
FEV6-%Pred-Pre: 90 %
FEV6-POST: 3.09 L
FEV6-PRE: 3.1 L
FEV6FVC-%PRED-PRE: 103 %
FEV6FVC-%Pred-Post: 103 %
FVC-%CHANGE-POST: 0 %
FVC-%PRED-POST: 87 %
FVC-%Pred-Pre: 87 %
FVC-Post: 3.09 L
FVC-Pre: 3.1 L
POST FEV1/FVC RATIO: 90 %
PRE FEV6/FVC RATIO: 100 %
Post FEV6/FVC ratio: 100 %
Pre FEV1/FVC ratio: 88 %

## 2016-01-03 NOTE — Progress Notes (Signed)
SMW performed today. 

## 2016-01-03 NOTE — Progress Notes (Signed)
PFT performed today with nitrogen washout. 

## 2016-01-04 ENCOUNTER — Ambulatory Visit (INDEPENDENT_AMBULATORY_CARE_PROVIDER_SITE_OTHER): Payer: 59 | Admitting: Internal Medicine

## 2016-01-04 ENCOUNTER — Encounter: Payer: Self-pay | Admitting: Internal Medicine

## 2016-01-04 VITALS — BP 124/76 | HR 117 | Ht 65.0 in | Wt 221.6 lb

## 2016-01-04 DIAGNOSIS — J309 Allergic rhinitis, unspecified: Secondary | ICD-10-CM

## 2016-01-04 MED ORDER — FLUTICASONE PROPIONATE 50 MCG/ACT NA SUSP: 2.0000 | Freq: Every day | NASAL | Status: DC

## 2016-01-04 MED ORDER — FLUTICASONE FUROATE-VILANTEROL 200-25 MCG/INH IN AEPB: 1.0000 | INHALATION_SPRAY | Freq: Every day | RESPIRATORY_TRACT | Status: AC

## 2016-01-04 NOTE — Progress Notes (Signed)
[facility] Pulmonary Medicine Consultation      Date: 01/04/2016,   MRN# [id number] Nancy Blake [facility] 11-10-1965 Code Status:  Code Status History    Date Active Date Inactive Code Status Order ID Comments User Context   10/14/2015  2:23 PM 10/16/2015  1:29 PM Full Code [id number]  Arnaldo Natal, MD Inpatient     Hosp day:@LENGTHOFSTAYDAYS @ Referring MD: @     PCP:      AdmissionWeight: 221 lb 9.6 oz (100.517 kg)                 CurrentWeight: 221 lb 9.6 oz (100.517 kg) Nancy Blake is a 50 y.o. old female seen in consultation for wheezing at the request of Dr. Elease Hashimoto     CHIEF COMPLAINT:  Follow up Patient with wheezing and cough and SOB    HISTORY OF PRESENT ILLNESS   50 yo white female seen today for follow up for wheezing and SOB and cough for last 2 months. cough and chest congestion  She has been doing much better over last 2 months with Symbicort however, has hoarse voice and occasional sore throat Still some nasal congestion, patient would like to try different inhaler  Has use albuterol several times in past 2 months  No signs of infection at this time   PFT  01/03/2016 reviewed with patient RAtio88% FEV1 98% FVC 87% DLCO 95%  WNL    Current Medication:  Current outpatient prescriptions:  .  albuterol (PROVENTIL HFA;VENTOLIN HFA) 108 (90 BASE) MCG/ACT inhaler, Inhale 2 puffs into the lungs every 6 (six) hours as needed for wheezing or shortness of breath., Disp: 1 Inhaler, Rfl: 2 .  budesonide-formoterol (SYMBICORT) 80-4.5 MCG/ACT inhaler, Inhale 2 puffs into the lungs 2 (two) times daily., Disp: 3 Inhaler, Rfl: 0 .  fexofenadine (ALLEGRA) 180 MG tablet, Take 180 mg by mouth daily., Disp: , Rfl:  .  ibuprofen (ADVIL,MOTRIN) 200 MG tablet, Take 600 mg by mouth every 6 (six) hours as needed for mild pain or moderate pain., Disp: , Rfl:  .  lansoprazole (PREVACID) 15 MG capsule, Take 15 mg by mouth daily at 12 noon., Disp: , Rfl:  .   metFORMIN (GLUCOPHAGE) 500 MG tablet, Take 1 tablet (500 mg total) by mouth 2 (two) times daily with a meal., Disp: 60 tablet, Rfl: 5 .  Multiple Vitamin (MULTIVITAMIN) tablet, Take 1 tablet by mouth daily., Disp: , Rfl:     ALLERGIES   Review of patient's allergies indicates no known allergies.     REVIEW OF SYSTEMS   Review of Systems  Constitutional: Negative for fever, chills and weight loss.  HENT: Positive for congestion.   Respiratory: Positive for cough. Negative for hemoptysis, sputum production, shortness of breath and wheezing.   Cardiovascular: Negative for chest pain, palpitations and leg swelling.  Gastrointestinal: Negative for heartburn and nausea.  Psychiatric/Behavioral: The patient is not nervous/anxious.   All other systems reviewed and are negative.    VS: BP 124/76 mmHg  Pulse 117  Ht  (1.651 m)  Wt 221 lb 9.6 oz (100.517 kg)  BMI 36.88 kg/m2  SpO2 97%     PHYSICAL EXAM  Physical Exam  Constitutional: She is oriented to person, place, and time. She appears well-developed and well-nourished. No distress.  HENT:  Mouth/Throat: No oropharyngeal exudate.  Eyes: EOM are normal. Pupils are equal, round, and reactive to light. Scleral icterus is present.  Neck: Normal range of motion. Neck supple.  Cardiovascular:  Normal rate, regular rhythm and normal heart sounds.   No murmur heard. Pulmonary/Chest: No stridor. No respiratory distress. She has no wheezes. She has no rales.  Musculoskeletal: She exhibits no edema.  Neurological: She is alert and oriented to person, place, and time.  Skin: She is not diaphoretic.  Psychiatric: She has a normal mood and affect.         ASSESSMENT/PLAN   50 yo white female seen today for signs and symptoms of allerghic rhinitis with reactive ariway disease c/w ASTHMA-mild intermittent at this point   1.will change to BREO 200/25 and assess interval changes. 2.albuterol as needed 3.start  flonase 4.advised to avoid all triggers 5.continue allegra daily 6.continue PPI   Follow up in 2 months   The Patient requires high complexity decision making for assessment and support, frequent evaluation and titration of therapies, application of advanced monitoring technologies and extensive interpretation of multiple databases.   Patient satisfied with Plan of action and management. All questions answered  Nancy Blake, M.D.  [facility]  Medical Director [facility] Medical Director [facility]

## 2016-01-04 NOTE — Addendum Note (Signed)
Addended by: Maxwell MarionBLANKENSHIP, MARGIE A on: 01/04/2016 09:23 AM   Modules accepted: Orders

## 2016-01-04 NOTE — Patient Instructions (Signed)
Asthma Attack Prevention While you may not be able to control the fact that you have asthma, you can take actions to prevent asthma attacks. The best way to prevent asthma attacks is to maintain good control of your asthma. You can achieve this by:  Taking your medicines as directed.  Avoiding things that can irritate your airways or make your asthma symptoms worse (asthma triggers).  Keeping track of how well your asthma is controlled and of any changes in your symptoms.  Responding quickly to worsening asthma symptoms (asthma attack).  Seeking emergency care when it is needed. WHAT ARE SOME WAYS TO PREVENT AN ASTHMA ATTACK? Have a Plan Work with your health care provider to create a written plan for managing and treating your asthma attacks (asthma action plan). This plan includes:  A list of your asthma triggers and how you can avoid them.  Information on when medicines should be taken and when their dosages should be changed.  The use of a device that measures how well your lungs are working (peak flow meter). Monitor Your Asthma Use your peak flow meter and record your results in a journal every day. A drop in your peak flow numbers on one or more days may indicate the start of an asthma attack. This can happen even before you start to feel symptoms. You can prevent an asthma attack from getting worse by following the steps in your asthma action plan. Avoid Asthma Triggers Work with your asthma health care provider to find out what your asthma triggers are. This can be done by:  Allergy testing.  Keeping a journal that notes when asthma attacks occur and the factors that may have contributed to them.  Determining if there are other medical conditions that are making your asthma worse. Once you have determined your asthma triggers, take steps to avoid them. This may include avoiding excessive or prolonged exposure to:  Dust. Have someone dust and vacuum your home for you once or  twice a week. Using a high-efficiency particulate arrestance (HEPA) vacuum is best.  Smoke. This includes campfire smoke, forest fire smoke, and secondhand smoke from tobacco products.  Pet dander. Avoid contact with animals that you know you are allergic to.  Allergens from trees, grasses or pollens. Avoid spending a lot of time outdoors when pollen counts are high, and on very windy days.  Very cold, dry, or humid air.  Mold.  Foods that contain high amounts of sulfites.  Strong odors.  Outdoor air pollutants, such as engine exhaust.  Indoor air pollutants, such as aerosol sprays and fumes from household cleaners.  Household pests, including dust mites and cockroaches, and pest droppings.  Certain medicines, including NSAIDs. Always talk to your health care provider before stopping or starting any new medicines. Medicines Take over-the-counter and prescription medicines only as told by your health care provider. Many asthma attacks can be prevented by carefully following your medicine schedule. Taking your medicines correctly is especially important when you cannot avoid certain asthma triggers. Act Quickly If an asthma attack does happen, acting quickly can decrease how severe it is and how long it lasts. Take these steps:   Pay attention to your symptoms. If you are coughing, wheezing, or having difficulty breathing, do not wait to see if your symptoms go away on their own. Follow your asthma action plan.  If you have followed your asthma action plan and your symptoms are not improving, call your health care provider or seek immediate medical care   at the nearest hospital. It is important to note how often you need to use your fast-acting rescue inhaler. If you are using your rescue inhaler more often, it may mean that your asthma is not under control. Adjusting your asthma treatment plan may help you to prevent future asthma attacks and help you to gain better control of your  condition. HOW CAN I PREVENT AN ASTHMA ATTACK WHEN I EXERCISE? Follow advice from your health care provider about whether you should use your fast-acting inhaler before exercising. Many people with asthma experience exercise-induced bronchoconstriction (EIB). This condition often worsens during vigorous exercise in cold, humid, or dry environments. Usually, people with EIB can stay very active by pre-treating with a fast-acting inhaler before exercising.   This information is not intended to replace advice given to you by your health care provider. Make sure you discuss any questions you have with your health care provider.   Document Released: 09/25/2009 Document Revised: 06/28/2015 Document Reviewed: 03/09/2015 Elsevier Interactive Patient Education 2016 Elsevier Inc.  

## 2016-01-05 ENCOUNTER — Ambulatory Visit: Payer: 59 | Admitting: Internal Medicine

## 2016-01-17 ENCOUNTER — Telehealth: Payer: Self-pay | Admitting: Internal Medicine

## 2016-01-17 ENCOUNTER — Other Ambulatory Visit: Payer: Self-pay

## 2016-01-17 DIAGNOSIS — E119 Type 2 diabetes mellitus without complications: Secondary | ICD-10-CM

## 2016-01-17 MED ORDER — FLUTICASONE FUROATE-VILANTEROL 200-25 MCG/INH IN AEPB: 1.0000 | INHALATION_SPRAY | Freq: Every day | RESPIRATORY_TRACT | Status: DC

## 2016-01-17 MED ORDER — GLUCOSE BLOOD VI STRP: ORAL_STRIP | Status: DC

## 2016-01-17 MED ORDER — METFORMIN HCL 500 MG PO TABS: 500.0000 mg | ORAL_TABLET | Freq: Two times a day (BID) | ORAL | Status: DC

## 2016-01-17 NOTE — Telephone Encounter (Signed)
Patient wants to continue with rx that she trialed of breo elipta 200 mcg/25 mcg 1 puff daily  She has 2 days left on sample  [facility] [address] if [address] is preferred

## 2016-01-17 NOTE — Telephone Encounter (Signed)
Spoke with pt. She would like to have a 90 day supply of Breo sent to her local pharmacy. Rx has been sent in. Nothing further was needed.

## 2016-01-19 ENCOUNTER — Ambulatory Visit: Payer: 59 | Admitting: Family Medicine

## 2016-01-22 ENCOUNTER — Encounter: Payer: Self-pay | Admitting: Family Medicine

## 2016-01-22 ENCOUNTER — Ambulatory Visit (INDEPENDENT_AMBULATORY_CARE_PROVIDER_SITE_OTHER): Payer: 59 | Admitting: Family Medicine

## 2016-01-22 DIAGNOSIS — E119 Type 2 diabetes mellitus without complications: Secondary | ICD-10-CM

## 2016-01-22 NOTE — Progress Notes (Signed)
Patient ID: Nancy Blake, female   DOB: Jul 18, 1966, 50 y.o.   MRN: [id number]       Patient: Nancy Blake Female    DOB: Jul 18, 1966   49 y.o.   MRN: [id number] Visit Date: 01/22/2016  Today's Provider: Lorie PhenixNancy Yaretzi Ernandez, MD   Chief Complaint  Patient presents with  . Diabetes   Subjective:    HPI ------------------------------------------------------------------------    No Known Allergies Previous Medications   ALBUTEROL (PROVENTIL HFA;VENTOLIN HFA) 108 (90 BASE) MCG/ACT INHALER    Inhale 2 puffs into the lungs every 6 (six) hours as needed for wheezing or shortness of breath.   BUDESONIDE-FORMOTEROL (SYMBICORT) 80-4.5 MCG/ACT INHALER    Inhale 2 puffs into the lungs 2 (two) times daily.   FEXOFENADINE (ALLEGRA) 180 MG TABLET    Take 180 mg by mouth daily.   FLUTICASONE (FLONASE) 50 MCG/ACT NASAL SPRAY    Place 2 sprays into both nostrils daily.   FLUTICASONE FUROATE-VILANTEROL (BREO ELLIPTA) 200-25 MCG/INH AEPB    Inhale 1 puff into the lungs daily.   GLUCOSE BLOOD TEST STRIP    Use as instructed   IBUPROFEN (ADVIL,MOTRIN) 200 MG TABLET    Take 600 mg by mouth every 6 (six) hours as needed for mild pain or moderate pain.   LANSOPRAZOLE (PREVACID) 15 MG CAPSULE    Take 15 mg by mouth daily at 12 noon.   METFORMIN (GLUCOPHAGE) 500 MG TABLET    Take 1 tablet (500 mg total) by mouth 2 (two) times daily with a meal.   MULTIPLE VITAMIN (MULTIVITAMIN) TABLET    Take 1 tablet by mouth daily.    Review of Systems  Constitutional: Negative.   Cardiovascular: Negative.   Endocrine: Negative.     Social History  Substance Use Topics  . Smoking status: Former Smoker    Quit date: 10/21/1983  . Smokeless tobacco: Never Used  . Alcohol Use: No   Objective:   There were no vitals taken for this visit.  Physical Exam      Assessment & Plan:     Patient not seen as provider was running behind and left 25 minutes after appointment time. Had to be at another appointment.        Lorie PhenixNancy Orvetta Danielski, MD  [facility]

## 2016-01-24 ENCOUNTER — Telehealth: Payer: Self-pay | Admitting: Family Medicine

## 2016-01-24 NOTE — Telephone Encounter (Signed)
Patient left when I was running behind Tuesday.  Please call in try to reschedule or see if she would like to schedule with another provider as with me leaving I think I am going to run behind for the next 3 months to be honest. Thanks.

## 2016-01-25 NOTE — Telephone Encounter (Signed)
Pt has appointment scheduled with you 01/29/2016 at 4:30 pm. Allene DillonEmily Drozdowski, CMA

## 2016-01-29 ENCOUNTER — Encounter: Payer: Self-pay | Admitting: Family Medicine

## 2016-01-29 ENCOUNTER — Ambulatory Visit (INDEPENDENT_AMBULATORY_CARE_PROVIDER_SITE_OTHER): Payer: 59 | Admitting: Family Medicine

## 2016-01-29 VITALS — BP 110/80 | HR 98 | Temp 98.5°F | Resp 16 | Wt 222.0 lb

## 2016-01-29 DIAGNOSIS — E119 Type 2 diabetes mellitus without complications: Secondary | ICD-10-CM | POA: Diagnosis not present

## 2016-01-29 NOTE — Progress Notes (Signed)
Patient ID: Nancy Blake, female   DOB: 1966-10-07, 50 y.o.   MRN: [id number]       Patient: Nancy Blake Female    DOB: 1966/08/28   49 y.o.   MRN: [id number] Visit Date: 01/29/2016  Today's Provider: Lorie Phenix, MD   Chief Complaint  Patient presents with  . Diabetes   Subjective:    HPI  Diabetes Mellitus Type II, Follow-up:   Lab Results  Component Value Date   HGBA1C 8.1* 11/29/2015   HGBA1C 6.9* 10/14/2015    Last seen for diabetes 2 months ago.  Management since then includes starting Metformin. She reports excellent compliance with treatment. She is not having side effects.  Current symptoms include none and have been stable. Home blood sugar records: fasting range: 106-129 pt is checking 1-2 times a week.  Episodes of hypoglycemia? no   Current Insulin Regimen: none Most Recent eye exam: Weight trend: stable Prior visit with dietician: no Current diet: in general, a "healthy" diet   Current exercise: walking, 10-15 minutes daily.  Pertinent Labs:    Component Value Date/Time   CHOL 166 11/29/2015 0824   TRIG 105 11/29/2015 0824   HDL 46 11/29/2015 0824   LDLCALC 99 11/29/2015 0824   CREATININE 0.88 11/29/2015 0824    Wt Readings from Last 3 Encounters:  01/29/16 222 lb (100.699 kg)  01/04/16 221 lb 9.6 oz (100.517 kg)  12/08/15 234 lb (106.142 kg)   ------------------------------------------------------------------------    No Known Allergies Previous Medications   ALBUTEROL (PROVENTIL HFA;VENTOLIN HFA) 108 (90 BASE) MCG/ACT INHALER    Inhale 2 puffs into the lungs every 6 (six) hours as needed for wheezing or shortness of breath.   FEXOFENADINE (ALLEGRA) 180 MG TABLET    Take 180 mg by mouth daily.   FLUTICASONE (FLONASE) 50 MCG/ACT NASAL SPRAY    Place 2 sprays into both nostrils daily.   FLUTICASONE FUROATE-VILANTEROL (BREO ELLIPTA) 200-25 MCG/INH AEPB    Inhale 1 puff into the lungs daily.   GLUCOSE BLOOD TEST STRIP    Use as  instructed   IBUPROFEN (ADVIL,MOTRIN) 200 MG TABLET    Take 600 mg by mouth every 6 (six) hours as needed for mild pain or moderate pain.   LANSOPRAZOLE (PREVACID) 15 MG CAPSULE    Take 15 mg by mouth daily at 12 noon.   METFORMIN (GLUCOPHAGE) 500 MG TABLET    Take 1 tablet (500 mg total) by mouth 2 (two) times daily with a meal.   MULTIPLE VITAMIN (MULTIVITAMIN) TABLET    Take 1 tablet by mouth daily.    Review of Systems  Constitutional: Negative.   Respiratory: Negative.   Endocrine: Negative.     Social History  Substance Use Topics  . Smoking status: Former Smoker    Quit date: 10/21/1983  . Smokeless tobacco: Never Used  . Alcohol Use: No   Objective:   BP 110/80 mmHg  Pulse 98  Temp(Src) 98.5 F (36.9 C) (Oral)  Resp 16  Wt 222 lb (100.699 kg)  SpO2 97%  Physical Exam  Constitutional: She is oriented to person, place, and time. She appears well-developed and well-nourished.  Neurological: She is alert and oriented to person, place, and time.  Psychiatric: She has a normal mood and affect. Her behavior is normal. Judgment and thought content normal.       Assessment & Plan:     1. Type 2 diabetes mellitus without complication, without long-term current use of insulin (HCC) Stable.  Improved. Patient advised to continue current medication and plan of care. Patient advised to continue monitoring fasting blood sugars.  Patient advised to continue working on healthy lifestyle.  Recheck in 2 month with Daiva NakayamaJenni Burnette, PA-C.      Patient seen and examined by Dr. Leo GrosserNancy J.. Merita Blake, and note scribed by Liz BeachSulibeya S. Dimas, CMA.  I have reviewed the document for accuracy and completeness and I agree with above. - Leo GrosserNancy J. Topanga Alvelo, MD    [name], MD  [facility]

## 2016-02-12 ENCOUNTER — Other Ambulatory Visit: Payer: Self-pay | Admitting: Internal Medicine

## 2016-02-23 ENCOUNTER — Telehealth: Payer: Self-pay | Admitting: Internal Medicine

## 2016-02-23 MED ORDER — FEXOFENADINE HCL 180 MG PO TABS: 180.0000 mg | ORAL_TABLET | Freq: Every day | ORAL | Status: DC

## 2016-02-23 NOTE — Telephone Encounter (Signed)
RX sent in to pharmacy. Pt informed. Nothing further needed.

## 2016-02-23 NOTE — Telephone Encounter (Signed)
*  STAT* If patient is at the pharmacy, call can be transferred to refill team.   1. Which medications need to be refilled? (please list name of each medication and dose if known) Allegra D  2. Which pharmacy/location (including street and city if local pharmacy) is medication to be sent to?  [facility] [address]   Patient wants to know if it can go through the pharmacy or if its only otc? Please call patient.  3. Do they need a 30 day or 90 day supply? 30 day

## 2016-03-15 ENCOUNTER — Encounter: Payer: Self-pay | Admitting: *Deleted

## 2016-03-19 ENCOUNTER — Ambulatory Visit: Payer: 59 | Admitting: Anesthesiology

## 2016-03-19 ENCOUNTER — Encounter: Payer: Self-pay | Admitting: Anesthesiology

## 2016-03-19 ENCOUNTER — Encounter
Admission: RE | Disposition: A | Discharge status: Home / Self Care | Payer: Self-pay | Source: Ambulatory Visit | Attending: Gastroenterology

## 2016-03-19 ENCOUNTER — Ambulatory Visit
Admission: RE | Admit: 2016-03-19 | Discharge: 2016-03-19 | Disposition: A | Discharge status: Home / Self Care | Payer: 59 | Source: Ambulatory Visit | Attending: Gastroenterology | Admitting: Gastroenterology

## 2016-03-19 DIAGNOSIS — K621 Rectal polyp: Secondary | ICD-10-CM | POA: Insufficient documentation

## 2016-03-19 DIAGNOSIS — K640 First degree hemorrhoids: Secondary | ICD-10-CM | POA: Insufficient documentation

## 2016-03-19 DIAGNOSIS — Z79899 Other long term (current) drug therapy: Secondary | ICD-10-CM | POA: Insufficient documentation

## 2016-03-19 DIAGNOSIS — Z87891 Personal history of nicotine dependence: Secondary | ICD-10-CM | POA: Insufficient documentation

## 2016-03-19 DIAGNOSIS — E119 Type 2 diabetes mellitus without complications: Secondary | ICD-10-CM | POA: Diagnosis not present

## 2016-03-19 DIAGNOSIS — Z1211 Encounter for screening for malignant neoplasm of colon: Secondary | ICD-10-CM | POA: Insufficient documentation

## 2016-03-19 DIAGNOSIS — J45909 Unspecified asthma, uncomplicated: Secondary | ICD-10-CM | POA: Insufficient documentation

## 2016-03-19 HISTORY — DX: Unspecified asthma, uncomplicated: J45.909

## 2016-03-19 HISTORY — PX: COLONOSCOPY WITH PROPOFOL: SHX5780

## 2016-03-19 HISTORY — DX: Type 2 diabetes mellitus without complications: E11.9

## 2016-03-19 LAB — GLUCOSE, CAPILLARY: Glucose-Capillary: 98 mg/dL (ref 65–99)

## 2016-03-19 SURGERY — COLONOSCOPY WITH PROPOFOL
Anesthesia: General

## 2016-03-19 MED ORDER — LIDOCAINE HCL (PF) 2 % IJ SOLN
INTRAMUSCULAR | Status: DC | PRN
  Administered 2016-03-19: 60 mg via INTRADERMAL

## 2016-03-19 MED ORDER — PROPOFOL 500 MG/50ML IV EMUL
INTRAVENOUS | Status: DC | PRN
  Administered 2016-03-19: 150 ug/kg/min via INTRAVENOUS

## 2016-03-19 MED ORDER — PROPOFOL 10 MG/ML IV BOLUS
INTRAVENOUS | Status: DC | PRN
  Administered 2016-03-19: 80 mg via INTRAVENOUS

## 2016-03-19 MED ORDER — MIDAZOLAM HCL 2 MG/2ML IJ SOLN
INTRAMUSCULAR | Status: DC | PRN
  Administered 2016-03-19: 2 mg via INTRAVENOUS

## 2016-03-19 MED ORDER — SODIUM CHLORIDE 0.9 % IV SOLN
INTRAVENOUS | Status: DC
  Administered 2016-03-19: 1000 mL via INTRAVENOUS

## 2016-03-19 NOTE — Anesthesia Postprocedure Evaluation (Signed)
Anesthesia Post Note  Patient: Nancy Blake  Procedure(s) Performed: Procedure(s) (LRB): COLONOSCOPY WITH PROPOFOL (N/A)  Patient location during evaluation: Endoscopy Anesthesia Type: General Level of consciousness: awake and alert Pain management: pain level controlled Vital Signs Assessment: post-procedure vital signs reviewed and stable Respiratory status: spontaneous breathing, nonlabored ventilation, respiratory function stable and patient connected to nasal cannula oxygen Cardiovascular status: blood pressure returned to baseline and stable Postop Assessment: no signs of nausea or vomiting Anesthetic complications: no    Last Vitals:  Filed Vitals:   03/19/16 0836 03/19/16 0846  BP: 114/78 110/72  Pulse: 83 74  Temp:    Resp: 12 20    Last Pain: There were no vitals filed for this visit.               Ricci Paff S

## 2016-03-19 NOTE — Anesthesia Postprocedure Evaluation (Signed)
Anesthesia Post Note  Patient: Nancy Blake  Procedure(s) Performed: Procedure(s) (LRB): COLONOSCOPY WITH PROPOFOL (N/A)  Patient location during evaluation: PACU Anesthesia Type: General Level of consciousness: awake, oriented and awake and alert Pain management: pain level controlled Vital Signs Assessment: post-procedure vital signs reviewed and stable Respiratory status: spontaneous breathing, nonlabored ventilation and respiratory function stable Cardiovascular status: stable Anesthetic complications: no    Last Vitals:  Filed Vitals:   03/19/16 0816 03/19/16 0826  BP: 107/72 99/74  Pulse: 90 81  Temp: 36.1 C   Resp: 14 12    Last Pain: There were no vitals filed for this visit.               [employer]so[employer] Orissa Arreaga

## 2016-03-19 NOTE — Op Note (Signed)
[facility] Gastroenterology Patient Name: Nancy RenshawKimberly Blake Procedure Date: 03/19/2016 7:58 AM MRN: [id number] Account #: [id number] Date of Birth: August 31, 1966 Admit Type: Outpatient Age: [age] [facility] 4 Gender: Female Note Status: Finalized Procedure:            Colonoscopy Indications:          Screening for colorectal malignant neoplasm Providers:            Midge Miniumarren Allisen Pidgeon, MD Referring MD:         Leo GrosserNancy J. Maloney, MD (Referring MD) Medicines:            Propofol per Anesthesia Complications:        No immediate complications. Procedure:            Pre-Anesthesia Assessment:                       - Prior to the procedure, a History and Physical was                        performed, and patient medications and allergies were                        reviewed. The patient's tolerance of previous                        anesthesia was also reviewed. The risks and benefits of                        the procedure and the sedation options and risks were                        discussed with the patient. All questions were                        answered, and informed consent was obtained. Prior                        Anticoagulants: The patient has taken no previous                        anticoagulant or antiplatelet agents. ASA Grade                        Assessment: II - A patient with mild systemic disease.                        After reviewing the risks and benefits, the patient was                        deemed in satisfactory condition to undergo the                        procedure.                       After obtaining informed consent, the colonoscope was                        passed under direct vision. Throughout the procedure,  the patient's blood pressure, pulse, and oxygen                        saturations were monitored continuously. The                        Colonoscope was introduced through the anus and            advanced to the the cecum, identified by appendiceal                        orifice and ileocecal valve. The colonoscopy was                        performed without difficulty. The patient tolerated the                        procedure well. The quality of the bowel preparation                        was excellent. Findings:      The perianal and digital rectal examinations were normal.      A 4 mm polyp was found in the rectum. The polyp was sessile. The polyp       was removed with a cold biopsy forceps. Resection and retrieval were       complete.      Non-bleeding internal hemorrhoids were found during retroflexion. The       hemorrhoids were Grade I (internal hemorrhoids that do not prolapse). Impression:           - One 4 mm polyp in the rectum, removed with a cold                        biopsy forceps. Resected and retrieved.                       - Non-bleeding internal hemorrhoids. Recommendation:       - Await pathology results.                       - Repeat colonoscopy in 5 years if polyp adenoma and 10                        years if hyperplastic Procedure Code(s):    --- Professional ---                       [phone], Colonoscopy, flexible; with biopsy, single or                        multiple Diagnosis Code(s):    --- Professional ---                       Z12.11, Encounter for screening for malignant neoplasm                        of colon                       K62.1, Rectal polyp CPT copyright 2016 American Medical Association. All rights reserved. The codes documented in this report are preliminary and  upon coder review may  be revised to meet current compliance requirements. Midge Minium, MD 03/19/2016 8:14:00 AM This report has been signed electronically. Number of Addenda: 0 Note Initiated On: 03/19/2016 7:58 AM Scope Withdrawal Time: 0 hours 7 minutes 9 seconds  Total Procedure Duration: 0 hours 9 minutes 23 seconds       [facility]

## 2016-03-19 NOTE — Anesthesia Preprocedure Evaluation (Addendum)
Anesthesia Evaluation  Patient identified by MRN, date of birth, ID band Patient awake    Reviewed: Allergy & Precautions, NPO status , Patient's Chart, lab work & pertinent test results, reviewed documented beta blocker date and time   Airway Mallampati: II  TM Distance: >3 FB     Dental  (+) Chipped   Pulmonary pneumonia, resolved, former smoker,           Cardiovascular      Neuro/Psych    GI/Hepatic GERD  Controlled,  Endo/Other  diabetes, Type 2  Renal/GU      Musculoskeletal   Abdominal   Peds  Hematology   Anesthesia Other Findings Had hysterectomy.  Reproductive/Obstetrics                            Anesthesia Physical Anesthesia Plan  ASA: III  Anesthesia Plan: General   Post-op Pain Management:    Induction: Intravenous  Airway Management Planned: Nasal Cannula  Additional Equipment:   Intra-op Plan:   Post-operative Plan:   Informed Consent: I have reviewed the patients History and Physical, chart, labs and discussed the procedure including the risks, benefits and alternatives for the proposed anesthesia with the patient or authorized representative who has indicated his/her understanding and acceptance.     Plan Discussed with: CRNA  Anesthesia Plan Comments:         Anesthesia Quick Evaluation

## 2016-03-19 NOTE — Transfer of Care (Signed)
Immediate Anesthesia Transfer of Care Note  Patient: Nancy Blake  Procedure(s) Performed: Procedure(s): COLONOSCOPY WITH PROPOFOL (N/A)  Patient Location: PACU  Anesthesia Type:General  Level of Consciousness: responds to stimulation, sleeping  Airway & Oxygen Therapy: Patient Spontanous Breathing and Patient connected to nasal cannula oxygen  Post-op Assessment: Report given to RN and Post -op Vital signs reviewed and stable  Post vital signs: Reviewed and stable  Last Vitals:  Filed Vitals:   03/19/16 0711  BP: 123/94  Pulse: 89  Temp: 36 C  Resp: 16    Last Pain: There were no vitals filed for this visit.       Complications: No apparent anesthesia complications

## 2016-03-19 NOTE — H&P (Signed)
[facility]  [address] [place], [place] [address] Phone: [phone] Fax : [phone]  Primary Care Physician:  Lorie PhenixNancy Maloney, MD Primary Gastroenterologist:  Dr. Servando SnareWohl  Pre-Procedure History & Physical: HPI:  Nancy Blake is a 50 y.o. female is here for a screening colonoscopy.   Past Medical History  Diagnosis Date  . Allergic rhinitis   . Pneumonia   . Asthma   . Diabetes mellitus without complication [facility](HCC)     Past Surgical History  Procedure Laterality Date  . Appendectomy    . Abdominal hysterectomy    . Tubal ligation  1995    Prior to Admission medications   Medication Sig Start Date End Date Taking? Authorizing Provider  albuterol (PROVENTIL HFA;VENTOLIN HFA) 108 (90 BASE) MCG/ACT inhaler Inhale 2 puffs into the lungs every 6 (six) hours as needed for wheezing or shortness of breath. 10/16/15   Enid Baasadhika [name], MD  fexofenadine (ALLEGRA) 180 MG tablet Take 1 tablet (180 mg total) by mouth daily. 02/23/16   Erin FullingKurian Kasa, MD  fluticasone (FLONASE) 50 MCG/ACT nasal spray Place 2 sprays into both nostrils daily. 01/04/16 01/03/17  Erin FullingKurian Kasa, MD  fluticasone furoate-vilanterol (BREO ELLIPTA) 200-25 MCG/INH AEPB Inhale 1 puff into the lungs daily. 01/17/16   Erin FullingKurian Kasa, MD  glucose blood test strip Use as instructed 01/17/16   Lorie PhenixNancy Maloney, MD  ibuprofen (ADVIL,MOTRIN) 200 MG tablet Take 600 mg by mouth every 6 (six) hours as needed for mild pain or moderate pain.    Historical Provider, MD  lansoprazole (PREVACID) 15 MG capsule Take 15 mg by mouth daily at 12 noon.    Historical Provider, MD  metFORMIN (GLUCOPHAGE) 500 MG tablet Take 1 tablet (500 mg total) by mouth 2 (two) times daily with a meal. 01/17/16   Lorie PhenixNancy Maloney, MD  Multiple Vitamin (MULTIVITAMIN) tablet Take 1 tablet by mouth daily.    Historical Provider, MD    Allergies as of 11/23/2015  . (No Known Allergies)    Family History  Problem Relation Age of Onset  . CAD  Mother   . Healthy Father   . Healthy Brother   . Osteoporosis Maternal Grandmother   . COPD Maternal Grandfather   . Lung cancer Paternal Grandfather   . Breast cancer Neg Hx     Social History   Social History  . Marital Status: Married    Spouse Name: N/A  . Number of Children: N/A  . Years of Education: N/A   Occupational History  . Not on file.   Social History Main Topics  . Smoking status: Former Smoker    Quit date: 10/21/1983  . Smokeless tobacco: Never Used  . Alcohol Use: No  . Drug Use: No  . Sexual Activity: Not on file   Other Topics Concern  . Not on file   Social History Narrative    Review of Systems: See HPI, otherwise negative ROS  Physical Exam: BP 123/94 mmHg  Pulse 89  Temp(Src) 96.8 F (36 C) (Tympanic)  Resp 16  Ht 5\' 4"  (1.626 m)  Wt 210 lb (95.255 kg)  BMI 36.03 kg/m2  SpO2 100% General:   Alert,  pleasant and cooperative in NAD Head:  Normocephalic and atraumatic. Neck:  Supple; no masses or thyromegaly. Lungs:  Clear throughout to auscultation.    Heart:  Regular rate and rhythm. Abdomen:  Soft, nontender and nondistended. Normal bowel sounds, without guarding, and without rebound.   Neurologic:  Alert and  oriented x4;  grossly normal neurologically.  Impression/Plan: Nancy Blake is now here to undergo a screening colonoscopy.  Risks, benefits, and alternatives regarding colonoscopy have been reviewed with the patient.  Questions have been answered.  All parties agreeable.

## 2016-03-20 ENCOUNTER — Encounter: Payer: Self-pay | Admitting: Gastroenterology

## 2016-03-20 LAB — SURGICAL PATHOLOGY

## 2016-03-21 ENCOUNTER — Encounter: Payer: Self-pay | Admitting: Gastroenterology

## 2016-04-01 ENCOUNTER — Ambulatory Visit: Payer: 59 | Admitting: Physician Assistant

## 2016-04-05 ENCOUNTER — Telehealth: Payer: Self-pay | Admitting: Internal Medicine

## 2016-04-05 MED ORDER — PREDNISONE 10 MG PO TABS: ORAL_TABLET | ORAL | Status: DC

## 2016-04-05 NOTE — Telephone Encounter (Signed)
As stated previously, pt denied fever and has a dry cough. Spoke with pt. She is aware of TP's recommendations. Rx has been sent in. Pt has appointment with Dr. Belia HemanKasa on 04/25/16. She does not feel like she will need a sooner appointment. Nothing further was needed at this time.

## 2016-04-05 NOTE — Telephone Encounter (Signed)
Spoke with pt. Reports dry cough, chest tightness, wheezing, SOB. Denies fever. Has been taking Nyquil and Allegra D with no relief. Symptoms started 2 days ago. Would like something to be called in since she is on vacation. Prescriptions will need [facility] [place] (pharmacy has been added to chart).  TP - please advise. Thanks.

## 2016-04-05 NOTE — Telephone Encounter (Signed)
Pt calling stating she is on vacation and is starting to not feel well Would like to know if we can call something in  Has a "creepy sounding cough" along with little chest pain.  When she laughs she starts to cough Doesn't want it to get any worst.  Please advise.

## 2016-04-05 NOTE — Telephone Encounter (Signed)
Is she taking her BREO ? Any fever or discolored mucus. ?  Is she on vacation?  She can have prednisone 10mg   taper #20 4 tabs for 2 days, then 3 tabs for 2 days, 2 tabs for 2 days, then 1 tab for 2 days, then stop  If symptoms are not improving will need to be seen on return or go to local ER /urgent care for evaluation  Please contact office for sooner follow up if symptoms do not improve or worsen or seek emergency care  Cont on mucinex and allegra  Make ov on return to see Kasa to make sure better.

## 2016-04-15 ENCOUNTER — Telehealth: Payer: Self-pay | Admitting: Internal Medicine

## 2016-04-15 NOTE — Telephone Encounter (Signed)
Patient called to let Dr. Belia HemanKasa know that she had to use her emergency inhaler (albuterol (PROVENTIL HFA;VENTOLIN HFA) 108 (90 BASE) MCG/ACT inhaler   every day once or twice a day.  Please call patient.

## 2016-04-15 NOTE — Telephone Encounter (Signed)
On vacation last week and [place] [place] and ws given abx by the provider on call at work. Scheduled an appt for pt to be seen tomorrow by Vm. Nothing further needed.

## 2016-04-16 ENCOUNTER — Ambulatory Visit
Admission: RE | Admit: 2016-04-16 | Discharge: 2016-04-16 | Disposition: A | Discharge status: Home / Self Care | Payer: 59 | Source: Ambulatory Visit | Attending: Internal Medicine | Admitting: Internal Medicine

## 2016-04-16 ENCOUNTER — Ambulatory Visit (INDEPENDENT_AMBULATORY_CARE_PROVIDER_SITE_OTHER): Payer: 59 | Admitting: Internal Medicine

## 2016-04-16 ENCOUNTER — Encounter: Payer: Self-pay | Admitting: Internal Medicine

## 2016-04-16 VITALS — BP 134/88 | HR 83 | Temp 98.5°F | Ht 64.0 in | Wt 210.0 lb

## 2016-04-16 DIAGNOSIS — R0602 Shortness of breath: Secondary | ICD-10-CM | POA: Insufficient documentation

## 2016-04-16 DIAGNOSIS — J309 Allergic rhinitis, unspecified: Secondary | ICD-10-CM | POA: Diagnosis not present

## 2016-04-16 DIAGNOSIS — J45901 Unspecified asthma with (acute) exacerbation: Secondary | ICD-10-CM | POA: Insufficient documentation

## 2016-04-16 MED ORDER — LEVOFLOXACIN 500 MG PO TABS
500.0000 mg | ORAL_TABLET | Freq: Every day | ORAL | Status: AC
Start: 2016-04-16 — End: 2016-04-23

## 2016-04-16 MED ORDER — IPRATROPIUM-ALBUTEROL 0.5-2.5 (3) MG/3ML IN SOLN: 3.0000 mL | RESPIRATORY_TRACT | Status: DC | PRN

## 2016-04-16 MED ORDER — PREDNISONE 20 MG PO TABS: 20.0000 mg | ORAL_TABLET | Freq: Every day | ORAL | Status: DC

## 2016-04-16 NOTE — Assessment & Plan Note (Signed)
Patient with asthma exacerbation Trigger, recent sick contact from husband who had sinusitis  No improvement with amoxicillin or prednisone taper  Plan: -Levaquin 500 milligrams 7 days -Point is on 20 mg 5 days -DuoNeb nebulizer treatment-1 treatment every 6 hours then as needed -Avoid any since can allergens -2 view chest x-ray

## 2016-04-16 NOTE — Patient Instructions (Addendum)
Follow up with Dr. Belia HemanKasa in:1 months - CXR 2 view - for sob and cough - Prednisone 20 mg x 5 days - Levaquin 500mg  - 1 tab daily x 7 days.  - duoneb nebulizer - 1 treatment every 6 hours for 3 days, then 1 treatment as needed every 3-4 hours as needed for shortness of breath\wheezing\recurrent cough - cont with Breo - stop Augmentin

## 2016-04-16 NOTE — Assessment & Plan Note (Signed)
Patient with recent sick contact in now with exacerbation of asthma accompanied with bronchospasms.  Plan: -We'll treat as asthma exacerbation

## 2016-04-16 NOTE — Progress Notes (Signed)
[facility] Pulmonary Medicine Consultation      Date: 04/16/2016,   MRN# [id number] Nancy Blake 22-Apr-1966 Code Status:  Code Status History    Date Active Date Inactive Code Status Order ID Comments User Context   10/14/2015  2:23 PM 10/16/2015  1:29 PM Full Code [id number]  Arnaldo NatalMichael S Diamond, MD Inpatient     Hosp day:@LENGTHOFSTAYDAYS @ Referring MD: @ATDPROV @     PCP:      AdmissionWeight: 210 lb (95.255 kg)                 CurrentWeight: 210 lb (95.255 kg) Nancy Blake is a 50 y.o. old female seen in for acute care visit of cough, nasal congestion, sob, and chest tightness     CHIEF COMPLAINT:  Nancy Blake is a 50 y.o. old female seen in for acute care visit of cough, nasal congestion, sob, and chest tightness    HISTORY OF PRESENT ILLNESS   Nancy Blake is a [age] y.o. old female seen in for acute care visit of cough, nasal congestion, sob, and chest tightness. Patient is a she's had to have increased use of albuterol inhaler 2-3 times per day. Review of records and per the patient shows that she has been on a prednisone taper from [place]. She's currently using Breo 200/25. Patient states over the last week she's had increasing cough, congestion, intermittent fevers, was placed on prednisone taper again with minimal improvement also was started on Augmentin with minimal improvement also. Today she endorses cough, congestion, mild wheezing, intermittent sputum production which is clear.    PFT  01/03/2016 reviewed with patient RAtio88% FEV1 98% FVC 87% DLCO 95%  6MWT WNL    Current Medication:  Current outpatient prescriptions:  .  albuterol (PROVENTIL HFA;VENTOLIN HFA) 108 (90 BASE) MCG/ACT inhaler, Inhale 2 puffs into the lungs every 6 (six) hours as needed for wheezing or shortness of breath., Disp: 1 Inhaler, Rfl: 2 .  amoxicillin-clavulanate (AUGMENTIN) 875-125 MG tablet, Take 1 tablet by mouth every 12 (twelve) hours.,  Disp: , Rfl: 0 .  fexofenadine (ALLEGRA) 180 MG tablet, Take 1 tablet (180 mg total) by mouth daily., Disp: 30 tablet, Rfl: 3 .  fluticasone (FLONASE) 50 MCG/ACT nasal spray, Place 2 sprays into both nostrils daily., Disp: 16 g, Rfl: 8 .  fluticasone furoate-vilanterol (BREO ELLIPTA) 200-25 MCG/INH AEPB, Inhale 1 puff into the lungs daily., Disp: 180 each, Rfl: 1 .  glucose blood test strip, Use as instructed, Disp: 100 each, Rfl: 12 .  ibuprofen (ADVIL,MOTRIN) 200 MG tablet, Take 600 mg by mouth every 6 (six) hours as needed for mild pain or moderate pain., Disp: , Rfl:  .  lansoprazole (PREVACID) 15 MG capsule, Take 15 mg by mouth daily at 12 noon., Disp: , Rfl:  .  metFORMIN (GLUCOPHAGE) 500 MG tablet, Take 1 tablet (500 mg total) by mouth 2 (two) times daily with a meal., Disp: 60 tablet, Rfl: 5 .  Multiple Vitamin (MULTIVITAMIN) tablet, Take 1 tablet by mouth daily., Disp: , Rfl:  .  ipratropium-albuterol (DUONEB) 0.5-2.5 (3) MG/3ML SOLN, Take 3 mLs by nebulization every 4 (four) hours as needed., Disp: 360 mL, Rfl: 1 .  levofloxacin (LEVAQUIN) 500 MG tablet, Take 1 tablet (500 mg total) by mouth daily., Disp: 7 tablet, Rfl: 0 .  predniSONE (DELTASONE) 20 MG tablet, Take 1 tablet (20 mg total) by mouth daily with breakfast., Disp: 5 tablet, Rfl: 0    ALLERGIES   Review  of patient's allergies indicates no known allergies.     REVIEW OF SYSTEMS   Review of Systems  Constitutional: Negative for fever, chills and weight loss.  HENT: Positive for congestion, hearing loss and sore throat.   Eyes: Negative for blurred vision.  Respiratory: Positive for cough and wheezing. Negative for hemoptysis, sputum production and shortness of breath.   Cardiovascular: Negative for chest pain, palpitations and leg swelling.  Gastrointestinal: Negative for heartburn and nausea.  Neurological: Negative for dizziness.  Endo/Heme/Allergies: Does not bruise/bleed easily.  Psychiatric/Behavioral: The  patient is not nervous/anxious.   All other systems reviewed and are negative.    VS: BP 134/88 mmHg  Pulse 83  Temp(Src) 98.5 F (36.9 C)  Ht 5\' 4"  (1.626 m)  Wt 210 lb (95.255 kg)  BMI 36.03 kg/m2  SpO2 97%     PHYSICAL EXAM  Physical Exam  Constitutional: She is oriented to person, place, and time. She appears well-developed and well-nourished. No distress.  HENT:  Head: Normocephalic.  Mouth/Throat: No oropharyngeal exudate.  Bilateral cerumen impaction  Eyes: EOM are normal. Pupils are equal, round, and reactive to light.  Neck: Normal range of motion. Neck supple.  Cardiovascular: Normal rate, regular rhythm and normal heart sounds.   No murmur heard. Pulmonary/Chest: No stridor. No respiratory distress. She has wheezes. She has no rales.  Fine expiratory wheezes at the bilateral bases  Abdominal: Soft.  Musculoskeletal: She exhibits no edema.  Neurological: She is alert and oriented to person, place, and time.  Skin: She is not diaphoretic.  Psychiatric: She has a normal mood and affect.         ASSESSMENT/PLAN   50 yo white female seen today for signs and symptoms of allerghic rhinitis with reactive ariway disease c/w ASTHMA-mild intermittent at this point now with acute exacerbation  Allergic rhinitis Continue with current allergy regiment.  Asthma exacerbation Patient with asthma exacerbation Trigger, recent sick contact from husband who had sinusitis  No improvement with amoxicillin or prednisone taper  Plan: -Levaquin 500 milligrams 7 days -Point is on 20 mg 5 days -DuoNeb nebulizer treatment-1 treatment every 6 hours then as needed -Avoid any since can allergens -2 view chest x-ray  Reactive airway disease with acute exacerbation Patient with recent sick contact in now with exacerbation of asthma accompanied with bronchospasms.  Plan: -We'll treat as asthma exacerbation

## 2016-04-16 NOTE — Assessment & Plan Note (Signed)
Continue with current allergy regiment.

## 2016-04-16 NOTE — Addendum Note (Signed)
Addended by: Maxwell MarionBLANKENSHIP, Shaunte Tuft A on: 04/16/2016 12:43 PM   Modules accepted: Orders

## 2016-04-17 ENCOUNTER — Telehealth: Payer: Self-pay | Admitting: *Deleted

## 2016-04-17 ENCOUNTER — Ambulatory Visit (INDEPENDENT_AMBULATORY_CARE_PROVIDER_SITE_OTHER): Payer: 59 | Admitting: Physician Assistant

## 2016-04-17 ENCOUNTER — Encounter: Payer: Self-pay | Admitting: Physician Assistant

## 2016-04-17 VITALS — BP 114/70 | HR 100 | Temp 98.2°F | Resp 16 | Wt 211.4 lb

## 2016-04-17 DIAGNOSIS — Z0189 Encounter for other specified special examinations: Secondary | ICD-10-CM

## 2016-04-17 DIAGNOSIS — E119 Type 2 diabetes mellitus without complications: Secondary | ICD-10-CM

## 2016-04-17 DIAGNOSIS — Z008 Encounter for other general examination: Secondary | ICD-10-CM

## 2016-04-17 LAB — POCT GLYCOSYLATED HEMOGLOBIN (HGB A1C)
Est. average glucose Bld gHb Est-mCnc: 131
HEMOGLOBIN A1C: 6.2

## 2016-04-17 MED ORDER — METFORMIN HCL 500 MG PO TABS: 500.0000 mg | ORAL_TABLET | Freq: Every day | ORAL | Status: DC

## 2016-04-17 NOTE — Telephone Encounter (Signed)
LMOM for pt to call back for results

## 2016-04-17 NOTE — Progress Notes (Signed)
Patient: Nancy DarnerKimberly W Cumming Female    DOB: [birth year]   50 y.o.   MRN: [id number] Visit Date: 04/17/2016  Today's Provider: Margaretann LovelessJennifer M Sanuel Ladnier, PA-C   Chief Complaint  Patient presents with  . Follow-up    Diabetes Type 2   Subjective:    HPI  Diabetes Mellitus Type II, Follow-up:   Lab Results  Component Value Date   HGBA1C 6.2 04/17/2016   HGBA1C 8.1* 11/29/2015   HGBA1C 6.9* 10/14/2015    Last seen for diabetes 2 months ago.  Management since then includes none.Patient was stable and was advised to continue current medication and monitoring fasting blood sugars. She is on Metformin. She reports excellent compliance with treatment. She is not having side effects.  Current symptoms include none and have been stable. Home blood sugar records: fasting range: 120-90's  Episodes of hypoglycemia? no   Current Insulin Regimen: none Most Recent Eye Exam:  Weight trend: stable Prior visit with dietician: no Current diet: in general, a "healthy" diet   Current exercise: walking 10-15 minutes at work.  Pertinent Labs:    Component Value Date/Time   CHOL 166 11/29/2015 0824   TRIG 105 11/29/2015 0824   HDL 46 11/29/2015 0824   LDLCALC 99 11/29/2015 0824   CREATININE 0.88 11/29/2015 0824    Wt Readings from Last 3 Encounters:  04/17/16 211 lb 6.4 oz (95.89 kg)  04/16/16 210 lb (95.255 kg)  03/19/16 210 lb (95.255 kg)    ------------------------------------------------------------------------ Asthma: She reports that she was feeling sick Friday a week ago. She went to see Pulmonologist yesterday and he prescribed Levaquin, Duoneb, and Prednisone. She reports that she feels better, with mild SOB. She is waiting on the chest Xray results, which were normal. He is also getting her a nebulizer for her to have at home at all times.     No Known Allergies Current Meds  Medication Sig  . albuterol (PROVENTIL HFA;VENTOLIN HFA) 108 (90 BASE) MCG/ACT inhaler Inhale  2 puffs into the lungs every 6 (six) hours as needed for wheezing or shortness of breath.  . fexofenadine (ALLEGRA) 180 MG tablet Take 1 tablet (180 mg total) by mouth daily.  . fluticasone (FLONASE) 50 MCG/ACT nasal spray Place 2 sprays into both nostrils daily.  . fluticasone furoate-vilanterol (BREO ELLIPTA) 200-25 MCG/INH AEPB Inhale 1 puff into the lungs daily.  Marland Kitchen. glucose blood test strip Use as instructed  . ibuprofen (ADVIL,MOTRIN) 200 MG tablet Take 600 mg by mouth every 6 (six) hours as needed for mild pain or moderate pain.  Marland Kitchen. ipratropium-albuterol (DUONEB) 0.5-2.5 (3) MG/3ML SOLN Take 3 mLs by nebulization every 4 (four) hours as needed.  . lansoprazole (PREVACID) 15 MG capsule Take 15 mg by mouth daily at 12 noon.  Marland Kitchen. levofloxacin (LEVAQUIN) 500 MG tablet Take 1 tablet (500 mg total) by mouth daily.  . metFORMIN (GLUCOPHAGE) 500 MG tablet Take 1 tablet (500 mg total) by mouth 2 (two) times daily with a meal.  . Multiple Vitamin (MULTIVITAMIN) tablet Take 1 tablet by mouth daily.  . predniSONE (DELTASONE) 20 MG tablet Take 1 tablet (20 mg total) by mouth daily with breakfast.    Review of Systems  Constitutional: Negative.   HENT: Negative.   Respiratory: Positive for shortness of breath. Negative for cough, chest tightness and wheezing (none currently).   Cardiovascular: Negative for chest pain, palpitations and leg swelling.  Genitourinary: Negative.   Neurological: Negative for weakness and numbness.  Social History  Substance Use Topics  . Smoking status: Former Smoker    Quit date: 10/21/1983  . Smokeless tobacco: Never Used  . Alcohol Use: No   Objective:   BP 114/70 mmHg  Pulse 100  Temp(Src) 98.2 F (36.8 C) (Oral)  Resp 16  Wt 211 lb 6.4 oz (95.89 kg)  SpO2 97%  Physical Exam  Constitutional: She appears well-developed and well-nourished. No distress.  Neck: Normal range of motion. Neck supple.  Cardiovascular: Normal rate, regular rhythm and normal  heart sounds.  Exam reveals no gallop and no friction rub.   No murmur heard. Pulmonary/Chest: Effort normal and breath sounds normal. No respiratory distress. She has no wheezes. She has no rales.  Skin: She is not diaphoretic.  Vitals reviewed.     Assessment & Plan:     1. Type 2 diabetes mellitus without complication, without long-term current use of insulin (HCC) HgBA1c down to 6.2. Patient has been working hard on lifestyle modifications. She is to continue this. She has lost approx 20 pounds since starting. Will decrease metformin to once daily use. Will see her back in Jan 2018 for CPE. She is to call the office if she has any acute issues, questions or concerns in the meantime. - POCT HgB A1C  2. Encounter for biometric screening Biometric form for [facility] filled out and given to patient.       Margaretann LovelessJennifer M Lyfe Monger, PA-C  [facility]

## 2016-04-17 NOTE — Telephone Encounter (Signed)
-----   Message from Stephanie AcreVishal Mungal, MD sent at 04/16/2016  3:04 PM EDT ----- Regarding: CXR Results Please inform patient that CXR looks normal, no signs of pneumonia. Symptoms mostly upper airway, and could be due to bronchitis.   Thanks VM

## 2016-04-19 NOTE — Telephone Encounter (Signed)
LMOM for pt with results. Nothing further needed.

## 2016-04-25 ENCOUNTER — Ambulatory Visit: Payer: 59 | Admitting: Internal Medicine

## 2016-05-09 ENCOUNTER — Encounter: Payer: Self-pay | Admitting: Internal Medicine

## 2016-05-09 ENCOUNTER — Ambulatory Visit (INDEPENDENT_AMBULATORY_CARE_PROVIDER_SITE_OTHER): Payer: 59 | Admitting: Internal Medicine

## 2016-05-09 VITALS — BP 126/76 | HR 95 | Ht 64.0 in | Wt 211.0 lb

## 2016-05-09 DIAGNOSIS — J4520 Mild intermittent asthma, uncomplicated: Secondary | ICD-10-CM | POA: Diagnosis not present

## 2016-05-09 MED ORDER — ALBUTEROL SULFATE HFA 108 (90 BASE) MCG/ACT IN AERS: 2.0000 | INHALATION_SPRAY | Freq: Four times a day (QID) | RESPIRATORY_TRACT | Status: DC | PRN

## 2016-05-09 NOTE — Progress Notes (Signed)
[facility] [place] Pulmonary Medicine Consultation      Date: 05/09/2016,   MRN# [id number] Nancy Blake [facility] 19-Sep-1966 Code Status:  Code Status History    Date Active Date Inactive Code Status Order ID Comments User Context   10/14/2015  2:23 PM 10/16/2015  1:29 PM Full Code [id number]  Arnaldo Natal, MD Inpatient     Hosp day:@LENGTHOFSTAYDAYS @ Referring MD: @     PCP:      AdmissionWeight: 211 lb (95.709 kg)                 CurrentWeight: 211 lb (95.709 kg) Nancy Blake is a 50 y.o. old female seen in for acute care visit of cough, nasal congestion, sob, and chest tightness     CHIEF COMPLAINT:  Follow up asthma exacerbtaion    HISTORY OF PRESENT ILLNESS   Patient feeling much better since being treated with prednisone and abx Patient has no cough and no wheezing Has lost 28 pounds with diet Breathing well overall Compliant with meds      PFT  01/03/2016 reviewed with patient RAtio88% FEV1 98% FVC 87% DLCO 95%  WNL    Current Medication:  Current outpatient prescriptions:  .  albuterol (PROVENTIL HFA;VENTOLIN HFA) 108 (90 BASE) MCG/ACT inhaler, Inhale 2 puffs into the lungs every 6 (six) hours as needed for wheezing or shortness of breath., Disp: 1 Inhaler, Rfl: 2 .  fexofenadine (ALLEGRA) 180 MG tablet, Take 1 tablet (180 mg total) by mouth daily., Disp: 30 tablet, Rfl: 3 .  fluticasone (FLONASE) 50 MCG/ACT nasal spray, Place 2 sprays into both nostrils daily., Disp: 16 g, Rfl: 8 .  fluticasone furoate-vilanterol (BREO ELLIPTA) 200-25 MCG/INH AEPB, Inhale 1 puff into the lungs daily., Disp: 180 each, Rfl: 1 .  glucose blood test strip, Use as instructed, Disp: 100 each, Rfl: 12 .  ibuprofen (ADVIL,MOTRIN) 200 MG tablet, Take 600 mg by mouth every 6 (six) hours as needed for mild pain or moderate pain., Disp: , Rfl:  .  ipratropium-albuterol (DUONEB) 0.5-2.5 (3) MG/3ML SOLN, Take 3 mLs by nebulization every 4 (four) hours as needed., Disp:  360 mL, Rfl: 1 .  lansoprazole (PREVACID) 15 MG capsule, Take 15 mg by mouth daily at 12 noon., Disp: , Rfl:  .  metFORMIN (GLUCOPHAGE) 500 MG tablet, Take 1 tablet (500 mg total) by mouth daily with breakfast., Disp: 60 tablet, Rfl: 5 .  Multiple Vitamin (MULTIVITAMIN) tablet, Take 1 tablet by mouth daily., Disp: , Rfl:     ALLERGIES   Review of patient's allergies indicates no known allergies.     REVIEW OF SYSTEMS   Review of Systems  Constitutional: Negative for fever, chills and weight loss.  HENT: Negative for congestion and sore throat.   Eyes: Negative for blurred vision.  Respiratory: Negative for cough, hemoptysis, sputum production, shortness of breath and wheezing.   Cardiovascular: Negative for chest pain, palpitations and leg swelling.  Gastrointestinal: Negative for heartburn and nausea.  All other systems reviewed and are negative.    VS: BP 126/76 mmHg  Pulse 95  Ht  (1.626 m)  Wt 211 lb (95.709 kg)  BMI 36.20 kg/m2  SpO2 96%     PHYSICAL EXAM  Physical Exam  Constitutional: She is oriented to person, place, and time. No distress.  Cardiovascular: Normal rate, regular rhythm and normal heart sounds.   No murmur heard. Pulmonary/Chest: No stridor. No respiratory distress. She has no wheezes. She has no rales.  Musculoskeletal:  She exhibits no edema.  Neurological: She is alert and oriented to person, place, and time. No cranial nerve deficit.  Skin: Skin is warm. She is not diaphoretic.  Psychiatric: She has a normal mood and affect.         ASSESSMENT/PLAN   50 yo white female seen today for signs and symptoms of allerghic rhinitis with reactive ariway disease c/w ASTHMA-mild intermittent at this point with recent exacerbation that has improved with steroids and abx  Allergic rhinitis Continue with current allergy regiment.  Asthma mild intermittent-well controlled now Continue breo continue albuterol as needed  Follow up in 3  months, ASTHMA attack prevention instructions provided  The Patient requires high complexity decision making for assessment and support, frequent evaluation and titration of therapies. Patient satisfied with Plan of action and management. All questions answered  Lucie LeatherKurian David Thomas Rhude, M.D.  [facility]  Medical Director [facility] Medical Director [facility] Card[facility]

## 2016-05-09 NOTE — Patient Instructions (Signed)
Continue inhaled meds as prescibed  Asthma Attack Prevention While you may not be able to control the fact that you have asthma, you can take actions to prevent asthma attacks. The best way to prevent asthma attacks is to maintain good control of your asthma. You can achieve this by:  Taking your medicines as directed.  Avoiding things that can irritate your airways or make your asthma symptoms worse (asthma triggers).  Keeping track of how well your asthma is controlled and of any changes in your symptoms.  Responding quickly to worsening asthma symptoms (asthma attack).  Seeking emergency care when it is needed. WHAT ARE SOME WAYS TO PREVENT AN ASTHMA ATTACK? Have a Plan Work with your health care provider to create a written plan for managing and treating your asthma attacks (asthma action plan). This plan includes:  A list of your asthma triggers and how you can avoid them.  Information on when medicines should be taken and when their dosages should be changed.  The use of a device that measures how well your lungs are working (peak flow meter). Monitor Your Asthma Use your peak flow meter and record your results in a journal every day. A drop in your peak flow numbers on one or more days may indicate the start of an asthma attack. This can happen even before you start to feel symptoms. You can prevent an asthma attack from getting worse by following the steps in your asthma action plan. Avoid Asthma Triggers Work with your asthma health care provider to find out what your asthma triggers are. This can be done by:  Allergy testing.  Keeping a journal that notes when asthma attacks occur and the factors that may have contributed to them.  Determining if there are other medical conditions that are making your asthma worse. Once you have determined your asthma triggers, take steps to avoid them. This may include avoiding excessive or prolonged exposure to:  Dust. Have someone dust  and vacuum your home for you once or twice a week. Using a high-efficiency particulate arrestance (HEPA) vacuum is best.  Smoke. This includes campfire smoke, forest fire smoke, and secondhand smoke from tobacco products.  Pet dander. Avoid contact with animals that you know you are allergic to.  Allergens from trees, grasses or pollens. Avoid spending a lot of time outdoors when pollen counts are high, and on very windy days.  Very cold, dry, or humid air.  Mold.  Foods that contain high amounts of sulfites.  Strong odors.  Outdoor air pollutants, such as Museum/gallery exhibitions officerengine exhaust.  Indoor air pollutants, such as aerosol sprays and fumes from household cleaners.  Household pests, including dust mites and cockroaches, and pest droppings.  Certain medicines, including NSAIDs. Always talk to your health care provider before stopping or starting any new medicines. Medicines Take over-the-counter and prescription medicines only as told by your health care provider. Many asthma attacks can be prevented by carefully following your medicine schedule. Taking your medicines correctly is especially important when you cannot avoid certain asthma triggers. Act Quickly If an asthma attack does happen, acting quickly can decrease how severe it is and how long it lasts. Take these steps:   Pay attention to your symptoms. If you are coughing, wheezing, or having difficulty breathing, do not wait to see if your symptoms go away on their own. Follow your asthma action plan.  If you have followed your asthma action plan and your symptoms are not improving, call your health care  provider or seek immediate medical care at the nearest hospital. It is important to note how often you need to use your fast-acting rescue inhaler. If you are using your rescue inhaler more often, it may mean that your asthma is not under control. Adjusting your asthma treatment plan may help you to prevent future asthma attacks and help you  to gain better control of your condition. HOW CAN I PREVENT AN ASTHMA ATTACK WHEN I EXERCISE? Follow advice from your health care provider about whether you should use your fast-acting inhaler before exercising. Many people with asthma experience exercise-induced bronchoconstriction (EIB). This condition often worsens during vigorous exercise in cold, humid, or dry environments. Usually, people with EIB can stay very active by pre-treating with a fast-acting inhaler before exercising.   This information is not intended to replace advice given to you by your health care provider. Make sure you discuss any questions you have with your health care provider.   Document Released: 09/25/2009 Document Revised: 06/28/2015 Document Reviewed: 03/09/2015 Elsevier Interactive Patient Education [employer]

## 2016-05-29 NOTE — Telephone Encounter (Signed)
error 

## 2016-08-09 ENCOUNTER — Ambulatory Visit: Payer: 59 | Admitting: Internal Medicine

## 2016-08-09 ENCOUNTER — Ambulatory Visit (INDEPENDENT_AMBULATORY_CARE_PROVIDER_SITE_OTHER): Payer: 59 | Admitting: Internal Medicine

## 2016-08-09 ENCOUNTER — Encounter: Payer: Self-pay | Admitting: Internal Medicine

## 2016-08-09 VITALS — BP 126/76 | HR 81 | Ht 64.0 in | Wt 206.8 lb

## 2016-08-09 DIAGNOSIS — J4520 Mild intermittent asthma, uncomplicated: Secondary | ICD-10-CM

## 2016-08-09 MED ORDER — CETIRIZINE HCL 10 MG PO TABS: 10.0000 mg | ORAL_TABLET | Freq: Every day | ORAL | 3 refills | Status: DC

## 2016-08-09 MED ORDER — IPRATROPIUM BROMIDE 0.06 % NA SOLN: 2.0000 | Freq: Four times a day (QID) | NASAL | 6 refills | Status: DC

## 2016-08-09 NOTE — Progress Notes (Signed)
[facility] Pulmonary Medicine Consultation      Date: 08/09/2016,   MRN# [id number] Stepfanie Yott [facility] 15-Dec-1965 Code Status:  Code Status History    Date Active Date Inactive Code Status Order ID Comments User Context   10/14/2015  2:23 PM 10/16/2015  1:29 PM Full Code [id number]  Arnaldo Natal, MD Inpatient     Hosp day:@LENGTHOFSTAYDAYS @ Referring MD: @ATDPROV @     PCP:      AdmissionWeight: 206 lb 12.8 oz (93.8 kg)                 CurrentWeight: 206 lb 12.8 oz (93.8 kg) Nancy Blake is a 51 y.o. old female seen in for acute care visit of cough, nasal congestion, sob, and chest tightness     CHIEF COMPLAINT:  Follow up asthma     HISTORY OF PRESENT ILLNESS   Patient feeling much better since being treated with inhaled BReo and Dounbes as needed Patient has intermittent  cough and wheezing Mostly has  problems allergic rhinitis Breathing well overall Compliant with meds  Current Regimen: BREO 200, flonase, Allegra D    PFT  01/03/2016 reviewed with patient RAtio88% FEV1 98% FVC 87% DLCO 95%  WNL    Current Medication:  Current Outpatient Prescriptions:  .  albuterol (PROVENTIL HFA;VENTOLIN HFA) 108 (90 Base) MCG/ACT inhaler, Inhale 2 puffs into the lungs every 6 (six) hours as needed for wheezing or shortness of breath., Disp: 1 Inhaler, Rfl: 2 .  fluticasone (FLONASE) 50 MCG/ACT nasal spray, Place 2 sprays into both nostrils daily., Disp: 16 g, Rfl: 8 .  fluticasone furoate-vilanterol (BREO ELLIPTA) 200-25 MCG/INH AEPB, Inhale 1 puff into the lungs daily., Disp: 180 each, Rfl: 1 .  glucose blood test strip, Use as instructed, Disp: 100 each, Rfl: 12 .  ibuprofen (ADVIL,MOTRIN) 200 MG tablet, Take 600 mg by mouth every 6 (six) hours as needed for mild pain or moderate pain., Disp: , Rfl:  .  ipratropium-albuterol (DUONEB) 0.5-2.5 (3) MG/3ML SOLN, Take 3 mLs by nebulization every 4 (four) hours as needed., Disp: 360 mL, Rfl: 1 .  lansoprazole  (PREVACID) 15 MG capsule, Take 15 mg by mouth daily at 12 noon., Disp: , Rfl:  .  loratadine-pseudoephedrine (CLARITIN-D 12-HOUR) 5-120 MG tablet, Take 1 tablet by mouth 2 (two) times daily., Disp: , Rfl:  .  metFORMIN (GLUCOPHAGE) 500 MG tablet, Take 1 tablet (500 mg total) by mouth daily with breakfast., Disp: 60 tablet, Rfl: 5 .  Multiple Vitamin (MULTIVITAMIN) tablet, Take 1 tablet by mouth daily., Disp: , Rfl:     ALLERGIES   Review of patient's allergies indicates no known allergies.     REVIEW OF SYSTEMS   Review of Systems  Constitutional: Negative for chills, fever and weight loss.  HENT: Negative for congestion and sore throat.   Eyes: Negative for blurred vision.  Respiratory: Negative for cough, hemoptysis, sputum production, shortness of breath and wheezing.   Cardiovascular: Negative for chest pain, palpitations and leg swelling.  Gastrointestinal: Negative for heartburn and nausea.  All other systems reviewed and are negative.    VS: BP 126/76 (BP Location: Left Arm, Cuff Size: Normal)   Pulse 81   Ht 5\' 4"  (1.626 m)   Wt 206 lb 12.8 oz (93.8 kg)   SpO2 98%   BMI 35.50 kg/m      PHYSICAL EXAM  Physical Exam  Constitutional: She is oriented to person, place, and time. No distress.  Cardiovascular: Normal rate,  regular rhythm and normal heart sounds.   No murmur heard. Pulmonary/Chest: No stridor. No respiratory distress. She has no wheezes. She has no rales.  Musculoskeletal: She exhibits no edema.  Neurological: She is alert and oriented to person, place, and time. No cranial nerve deficit.  Skin: Skin is warm. She is not diaphoretic.  Psychiatric: She has a normal mood and affect.         ASSESSMENT/PLAN   50 yo white female seen today for signs and symptoms of persistent allerghic rhinitis with reactive ariway disease c/w ASTHMA-mild intermittent   Allergic rhinitis Continue intranasal steroids Will start Atrovent Nasal sprays Discussed  use of air purifier in bedroom Will start zyrtec 10 mg daily  and stop allegra D  Asthma mild intermittent-well controlled now Continue breo continue albuterol and dounebs as needed  GERD-continue PPI  Follow up in 3 months, ASTHMA attack prevention instructions provided  The Patient requires high complexity decision making for assessment and support, frequent evaluation and titration of therapies. Patient satisfied with Plan of action and management. All questions answered  Lucie LeatherKurian David Tyson Masin, M.D.  [facility]  Medical Director [facility] Medical Director [facility]

## 2016-08-09 NOTE — Patient Instructions (Signed)
Start Zyrtec , stop Allegra D Start atrovent nasal sprays 2 sprays each nostril twice daily as needed Avoid allergens Consider Air purifier Dounebs/albuterol every 4 hrs as needed   Asthma Attack Prevention While you may not be able to control the fact that you have asthma, you can take actions to prevent asthma attacks. The best way to prevent asthma attacks is to maintain good control of your asthma. You can achieve this by:  Taking your medicines as directed.  Avoiding things that can irritate your airways or make your asthma symptoms worse (asthma triggers).  Keeping track of how well your asthma is controlled and of any changes in your symptoms.  Responding quickly to worsening asthma symptoms (asthma attack).  Seeking emergency care when it is needed. WHAT ARE SOME WAYS TO PREVENT AN ASTHMA ATTACK? Have a Plan Work with your health care provider to create a written plan for managing and treating your asthma attacks (asthma action plan). This plan includes:  A list of your asthma triggers and how you can avoid them.  Information on when medicines should be taken and when their dosages should be changed.  The use of a device that measures how well your lungs are working (peak flow meter). Monitor Your Asthma Use your peak flow meter and record your results in a journal every day. A drop in your peak flow numbers on one or more days may indicate the start of an asthma attack. This can happen even before you start to feel symptoms. You can prevent an asthma attack from getting worse by following the steps in your asthma action plan. Avoid Asthma Triggers Work with your asthma health care provider to find out what your asthma triggers are. This can be done by:  Allergy testing.  Keeping a journal that notes when asthma attacks occur and the factors that may have contributed to them.  Determining if there are other medical conditions that are making your asthma worse. Once you  have determined your asthma triggers, take steps to avoid them. This may include avoiding excessive or prolonged exposure to:  Dust. Have someone dust and vacuum your home for you once or twice a week. Using a high-efficiency particulate arrestance (HEPA) vacuum is best.  Smoke. This includes campfire smoke, forest fire smoke, and secondhand smoke from tobacco products.  Pet dander. Avoid contact with animals that you know you are allergic to.  Allergens from trees, grasses or pollens. Avoid spending a lot of time outdoors when pollen counts are high, and on very windy days.  Very cold, dry, or humid air.  Mold.  Foods that contain high amounts of sulfites.  Strong odors.  Outdoor air pollutants, such as Museum/gallery exhibitions officerengine exhaust.  Indoor air pollutants, such as aerosol sprays and fumes from household cleaners.  Household pests, including dust mites and cockroaches, and pest droppings.  Certain medicines, including NSAIDs. Always talk to your health care provider before stopping or starting any new medicines. Medicines Take over-the-counter and prescription medicines only as told by your health care provider. Many asthma attacks can be prevented by carefully following your medicine schedule. Taking your medicines correctly is especially important when you cannot avoid certain asthma triggers. Act Quickly If an asthma attack does happen, acting quickly can decrease how severe it is and how long it lasts. Take these steps:   Pay attention to your symptoms. If you are coughing, wheezing, or having difficulty breathing, do not wait to see if your symptoms go away on their  own. Follow your asthma action plan.  If you have followed your asthma action plan and your symptoms are not improving, call your health care provider or seek immediate medical care at the nearest hospital. It is important to note how often you need to use your fast-acting rescue inhaler. If you are using your rescue inhaler more  often, it may mean that your asthma is not under control. Adjusting your asthma treatment plan may help you to prevent future asthma attacks and help you to gain better control of your condition. HOW CAN I PREVENT AN ASTHMA ATTACK WHEN I EXERCISE? Follow advice from your health care provider about whether you should use your fast-acting inhaler before exercising. Many people with asthma experience exercise-induced bronchoconstriction (EIB). This condition often worsens during vigorous exercise in cold, humid, or dry environments. Usually, people with EIB can stay very active by pre-treating with a fast-acting inhaler before exercising.   This information is not intended to replace advice given to you by your health care provider. Make sure you discuss any questions you have with your health care provider.   Document Released: 09/25/2009 Document Revised: 06/28/2015 Document Reviewed: 03/09/2015 Elsevier Interactive Patient Education [employer]

## 2016-10-16 ENCOUNTER — Telehealth: Payer: Self-pay | Admitting: Internal Medicine

## 2016-10-16 MED ORDER — PREDNISONE 20 MG PO TABS: 40.0000 mg | ORAL_TABLET | Freq: Every day | ORAL | 0 refills | Status: AC

## 2016-10-16 MED ORDER — DOXYCYCLINE HYCLATE 100 MG PO TABS: 100.0000 mg | ORAL_TABLET | Freq: Two times a day (BID) | ORAL | 0 refills | Status: DC

## 2016-10-16 NOTE — Telephone Encounter (Signed)
Prednisone and doxycycline orders sent to pharmacy. She needs to F/U with Dr Belia HemanKasa in the next 2 weeks or so  Billy Fischeravid Byron Peacock, MD PCCM service Mobile [phone] Pager [phone] 10/16/2016

## 2016-10-16 NOTE — Telephone Encounter (Signed)
Patient calling in sick with coughing, chills, achy and sore throat. Patient feels like she might have bronchitis. Please call patient. She would like a antibiotic called in.

## 2016-10-16 NOTE — Telephone Encounter (Signed)
Spoke with pt who states she feels she may have bronchitis. Pt c/o body aches, increased sob, wheezing, prod cough (unsure of color), chills & sweats X 3d Pt has some left over cough medication & did a duoneb last night with mild relief.  DS please advise. Thanks.

## 2016-10-16 NOTE — Telephone Encounter (Signed)
Pt aware of SD recommedations. Pt scheduled for 10/30/16 with DK. Nothing further needed.

## 2016-10-30 ENCOUNTER — Ambulatory Visit (INDEPENDENT_AMBULATORY_CARE_PROVIDER_SITE_OTHER): Payer: 59 | Admitting: Internal Medicine

## 2016-10-30 ENCOUNTER — Encounter: Payer: Self-pay | Admitting: Internal Medicine

## 2016-10-30 VITALS — BP 126/88 | HR 63 | Wt 206.0 lb

## 2016-10-30 DIAGNOSIS — J4520 Mild intermittent asthma, uncomplicated: Secondary | ICD-10-CM | POA: Diagnosis not present

## 2016-10-30 NOTE — Progress Notes (Signed)
[facility]      Date: 10/30/2016,   MRN# [id number] [facility]liche[facility]berly [facility] 05-Nov-1965 Code Status:  Code Status History    Date Active Date Inactive Code Status Order ID Comments User Context   10/14/2015  2:23 PM 10/16/2015  1:29 PM Full Code [id number]  Arnaldo NatalMichael S Diamond, MD Inpatient     Hosp day:@LENGTHOFSTAYDAYS @ Referring MD: @ATDPROV @     PCP:      AdmissionWeight: 206 lb (93.4 kg)                 CurrentWeight: 206 lb (93.4 kg) Linna DarnerKimberly W Sailer is a 51 y.o. old female seen in for acute care visit of cough, nasal congestion, sob, and chest tightness     CHIEF COMPLAINT:  Follow up asthma     HISTORY OF PRESENT ILLNESS  Patient had asthma exacerbation last month-given prednisone and doxy  Patient feeling much better since exacerbation has residual cough  being treated with inhaled BReo and Dounbes as needed  Breathing well overall Compliant with meds  Current Regimen: BREO 200, flonase, zyrtec    PFT  01/03/2016 reviewed with patient Ratio 88% FEV1 98% FVC 87% DLCO 95%  6MWT WNL   no signs of infection at this time  Current Medication:  Current Outpatient Prescriptions:  .  albuterol (PROVENTIL HFA;VENTOLIN HFA) 108 (90 Base) MCG/ACT inhaler, Inhale 2 puffs into the lungs every 6 (six) hours as needed for wheezing or shortness of breath., Disp: 1 Inhaler, Rfl: 2 .  cetirizine (ZYRTEC) 10 MG tablet, Take 1 tablet (10 mg total) by mouth daily., Disp: 90 tablet, Rfl: 3 .  doxycycline (VIBRA-TABS) 100 MG tablet, Take 1 tablet (100 mg total) by mouth 2 (two) times daily., Disp: 14 tablet, Rfl: 0 .  fluticasone (FLONASE) 50 MCG/ACT nasal spray, Place 2 sprays into both nostrils daily., Disp: 16 g, Rfl: 8 .  fluticasone furoate-vilanterol (BREO ELLIPTA) 200-25 MCG/INH AEPB, Inhale 1 puff into the lungs daily., Disp: 180 each, Rfl: 1 .  glucose blood test strip, Use as instructed, Disp: 100 each, Rfl: 12 .  ibuprofen (ADVIL,MOTRIN)  200 MG tablet, Take 600 mg by mouth every 6 (six) hours as needed for mild pain or moderate pain., Disp: , Rfl:  .  ipratropium (ATROVENT) 0.06 % nasal spray, Place 2 sprays into the nose 4 (four) times daily., Disp: 15 mL, Rfl: 6 .  ipratropium-albuterol (DUONEB) 0.5-2.5 (3) MG/3ML SOLN, Take 3 mLs by nebulization every 4 (four) hours as needed., Disp: 360 mL, Rfl: 1 .  lansoprazole (PREVACID) 15 MG capsule, Take 15 mg by mouth daily at 12 noon., Disp: , Rfl:  .  loratadine-pseudoephedrine (CLARITIN-D 12-HOUR) 5-120 MG tablet, Take 1 tablet by mouth 2 (two) times daily., Disp: , Rfl:  .  metFORMIN (GLUCOPHAGE) 500 MG tablet, Take 1 tablet (500 mg total) by mouth daily with breakfast., Disp: 60 tablet, Rfl: 5 .  Multiple Vitamin (MULTIVITAMIN) tablet, Take 1 tablet by mouth daily., Disp: , Rfl:     ALLERGIES   Patient has no known allergies.     REVIEW OF SYSTEMS   Review of Systems  Constitutional: Negative for chills, fever and weight loss.  HENT: Negative for congestion and sore throat.   Eyes: Negative for blurred vision.  Respiratory: Negative for cough, hemoptysis, sputum production, shortness of breath and wheezing.   Cardiovascular: Negative for chest pain, palpitations and leg swelling.  Gastrointestinal: Negative for heartburn and nausea.  All other systems reviewed and  are negative.    VS: BP 126/88 (BP Location: Left Arm, Cuff Size: Normal)   Pulse 63   Wt 206 lb (93.4 kg)   SpO2 100%   BMI 35.36 kg/m      PHYSICAL EXAM  Physical Exam  Constitutional: She is oriented to person, place, and time. No distress.  Cardiovascular: Normal rate, regular rhythm and normal heart sounds.   No murmur heard. Pulmonary/Chest: No stridor. No respiratory distress. She has no wheezes. She has no rales.  Musculoskeletal: She exhibits no edema.  Neurological: She is alert and oriented to person, place, and time. No cranial nerve deficit.  Skin: Skin is warm. She is not  diaphoretic.  Psychiatric: She has a normal mood and affect.         ASSESSMENT/PLAN   51 yo white female seen today for signs and symptoms of persistent allerghic rhinitis with reactive ariway disease c/w ASTHMA-mild intermittent   Allergic rhinitis Continue intranasal steroids Discussed use of air purifier in bedroom  zyrtec 10 mg daily  and stop allegra D pillows and sheets in drier and works well to kil dust mites  Asthma mild intermittent-well controlled now Continue breo continue albuterol and dounebs as needed  GERD-continue PPI  Follow up in 3 months, ASTHMA attack prevention instructions provided  The Patient requires high complexity decision making for assessment and support, frequent evaluation and titration of therapies. Patient satisfied with Plan of action and management. All questions answered  Lucie Leather, M.D.  [facility]  Medical Director [facility] Medical Director [facility] Cardio-Pulmonary Department

## 2016-10-30 NOTE — Patient Instructions (Signed)

## 2016-11-11 ENCOUNTER — Other Ambulatory Visit: Payer: Self-pay | Admitting: *Deleted

## 2016-11-11 MED ORDER — FLUTICASONE FUROATE-VILANTEROL 200-25 MCG/INH IN AEPB: 1.0000 | INHALATION_SPRAY | Freq: Every day | RESPIRATORY_TRACT | 2 refills | Status: DC

## 2016-11-20 ENCOUNTER — Ambulatory Visit: Payer: 59 | Admitting: Physician Assistant

## 2017-02-04 ENCOUNTER — Ambulatory Visit (INDEPENDENT_AMBULATORY_CARE_PROVIDER_SITE_OTHER): Payer: 59 | Admitting: Physician Assistant

## 2017-02-04 ENCOUNTER — Encounter: Payer: Self-pay | Admitting: Physician Assistant

## 2017-02-04 VITALS — BP 120/70 | HR 92 | Temp 98.2°F | Resp 16 | Ht 64.0 in | Wt 217.0 lb

## 2017-02-04 DIAGNOSIS — J4541 Moderate persistent asthma with (acute) exacerbation: Secondary | ICD-10-CM | POA: Diagnosis not present

## 2017-02-04 DIAGNOSIS — Z136 Encounter for screening for cardiovascular disorders: Secondary | ICD-10-CM

## 2017-02-04 DIAGNOSIS — Z13220 Encounter for screening for lipoid disorders: Secondary | ICD-10-CM

## 2017-02-04 DIAGNOSIS — R5383 Other fatigue: Secondary | ICD-10-CM

## 2017-02-04 DIAGNOSIS — Z1231 Encounter for screening mammogram for malignant neoplasm of breast: Secondary | ICD-10-CM | POA: Diagnosis not present

## 2017-02-04 DIAGNOSIS — Z0000 Encounter for general adult medical examination without abnormal findings: Secondary | ICD-10-CM | POA: Diagnosis not present

## 2017-02-04 DIAGNOSIS — Z1239 Encounter for other screening for malignant neoplasm of breast: Secondary | ICD-10-CM

## 2017-02-04 DIAGNOSIS — E119 Type 2 diabetes mellitus without complications: Secondary | ICD-10-CM

## 2017-02-04 LAB — POCT UA - MICROALBUMIN: MICROALBUMIN (UR) POC: NEGATIVE mg/L

## 2017-02-04 NOTE — Progress Notes (Signed)
Patient: Nancy Blake, Female    DOB: 07-10-1966, 51 y.o.   MRN: [id number] Visit Date: 02/04/2017  Today's Provider: Margaretann Loveless, PA-C   Chief Complaint  Patient presents with  . Annual Exam   Subjective:    Annual physical exam Nancy Blake is a 51 y.o. female who presents today for health maintenance and complete physical. She feels fairly well.She reports she has been feeling very tired. She reports exercising none. She reports she is sleeping well.  11/09/15 CPE 11/09/15 Pap-neg;HPV-neg 12/07/15 Mammogram-BI-RADS 1 03/19/16 Colonoscopy-polyp, internal hemorrhoids, recheck in 10 yrs. Dr. Servando Snare. ----------------------------------------------------------------- Patient reports that she stopped the Metformin because it was making her feel dizzy. Reports that her sugar levels at home are 102-106 in the mornings.  Review of Systems  Constitutional: Positive for fatigue.  HENT: Positive for postnasal drip, sinus pressure and tinnitus.   Eyes: Positive for itching.  Respiratory: Positive for wheezing (Occasional).   Cardiovascular: Negative.   Gastrointestinal: Negative.   Endocrine: Negative.   Genitourinary: Negative.   Musculoskeletal: Negative.   Skin: Negative.   Allergic/Immunologic: Positive for environmental allergies.  Neurological: Negative.   Hematological: Negative.   Psychiatric/Behavioral: Negative.     Social History      She  reports that she quit smoking about 33 years ago. She has never used smokeless tobacco. She reports that she does not drink alcohol or use drugs.       Social History   Social History  . Marital status: Married    Spouse name: N/A  . Number of children: N/A  . Years of education: N/A   Social History Main Topics  . Smoking status: Former Smoker    Quit date: 10/21/1983  . Smokeless tobacco: Never Used  . Alcohol use No  . Drug use: No  . Sexual activity: Not Asked   Other Topics Concern  . None     Social History Narrative  . None    Past Medical History:  Diagnosis Date  . Allergic rhinitis   . Asthma   . Diabetes mellitus without complication (HCC)   . Pneumonia      Patient Active Problem List   Diagnosis Date Noted  . Asthma exacerbation 04/16/2016  . Special screening for malignant neoplasms, colon   . Rectal polyp   . Diabetes (HCC) 12/08/2015  . Reactive airway disease with acute exacerbation 10/14/2015  . Absence of menstruation 10/12/2015  . Allergic rhinitis 04/01/2009  . Accumulation of fluid in tissues 04/01/2009  . Acid reflux 03/31/2009    Past Surgical History:  Procedure Laterality Date  . ABDOMINAL HYSTERECTOMY    . APPENDECTOMY    . COLONOSCOPY WITH PROPOFOL N/A 03/19/2016   Procedure: COLONOSCOPY WITH PROPOFOL;  Surgeon: Midge Minium, MD;  Location: [facility] ENDOSCOPY;  Service: Endoscopy;  Laterality: N/A;  . TUBAL LIGATION  1995    Family History        Family Status  Relation Status  . Mother Alive  . Father Alive  . Brother Alive  . Maternal Grandmother Deceased  . Maternal Grandfather Deceased  . Paternal Grandfather Deceased  . Neg Hx         Her family history includes CAD in her mother; COPD in her maternal grandfather; Healthy in her brother and father; Lung cancer in her paternal grandfather; Osteoporosis in her maternal grandmother.     No Known Allergies   Current Outpatient Prescriptions:  .  albuterol (PROVENTIL HFA;VENTOLIN HFA)  108 (90 Base) MCG/ACT inhaler, Inhale 2 puffs into the lungs every 6 (six) hours as needed for wheezing or shortness of breath., Disp: 1 Inhaler, Rfl: 2 .  cetirizine (ZYRTEC) 10 MG tablet, Take 1 tablet (10 mg total) by mouth daily., Disp: 90 tablet, Rfl: 3 .  fluticasone (FLONASE) 50 MCG/ACT nasal spray, Place 2 sprays into both nostrils daily., Disp: 16 g, Rfl: 8 .  fluticasone furoate-vilanterol (BREO ELLIPTA) 200-25 MCG/INH AEPB, Inhale 1 puff into the lungs daily., Disp: 180 each, Rfl: 2 .   glucose blood test strip, Use as instructed, Disp: 100 each, Rfl: 12 .  ibuprofen (ADVIL,MOTRIN) 200 MG tablet, Take 600 mg by mouth every 6 (six) hours as needed for mild pain or moderate pain., Disp: , Rfl:  .  ipratropium-albuterol (DUONEB) 0.5-2.5 (3) MG/3ML SOLN, Take 3 mLs by nebulization every 4 (four) hours as needed., Disp: 360 mL, Rfl: 1 .  lansoprazole (PREVACID) 15 MG capsule, Take 15 mg by mouth daily at 12 noon., Disp: , Rfl:  .  Multiple Vitamin (MULTIVITAMIN) tablet, Take 1 tablet by mouth daily., Disp: , Rfl:  .  loratadine-pseudoephedrine (CLARITIN-D 12-HOUR) 5-120 MG tablet, Take 1 tablet by mouth 2 (two) times daily., Disp: , Rfl:  .  metFORMIN (GLUCOPHAGE) 500 MG tablet, Take 1 tablet (500 mg total) by mouth daily with breakfast. (Patient not taking: Reported on 02/04/2017), Disp: 60 tablet, Rfl: 5   Patient Care Team: Margaretann Loveless, PA-C as PCP - General (Family Medicine)      Objective:   Vitals: BP 120/70 (BP Location: Right Arm, Patient Position: Sitting, Cuff Size: Normal)   Pulse 92   Temp 98.2 F (36.8 C) (Oral)   Resp 16   Ht  (1.626 m)   Wt 217 lb (98.4 kg)   SpO2 98%   BMI 37.25 kg/m    Vitals:   02/04/17 0921  BP: 120/70  Pulse: 92  Resp: 16  Temp: 98.2 F (36.8 C)  TempSrc: Oral  SpO2: 98%  Weight: 217 lb (98.4 kg)  Height:  (1.626 m)     Physical Exam  Constitutional: She is oriented to person, place, and time. She appears well-developed and well-nourished. No distress.  HENT:  Head: Normocephalic and atraumatic.  Right Ear: Hearing, tympanic membrane, external ear and ear canal normal.  Left Ear: Hearing, tympanic membrane, external ear and ear canal normal.  Nose: Nose normal.  Mouth/Throat: Uvula is midline, oropharynx is clear and moist and mucous membranes are normal. No oropharyngeal exudate.  Eyes: Conjunctivae and EOM are normal. Pupils are equal, round, and reactive to light. Right eye exhibits no discharge. Left  eye exhibits no discharge. No scleral icterus.  Neck: Normal range of motion. Neck supple. No JVD present. Carotid bruit is not present. No tracheal deviation present. No thyromegaly present.  Cardiovascular: Normal rate, regular rhythm, normal heart sounds and intact distal pulses.  Exam reveals no gallop and no friction rub.   No murmur heard. Pulmonary/Chest: Effort normal and breath sounds normal. No respiratory distress. She has no wheezes. She has no rales. She exhibits no tenderness. Right breast exhibits no inverted nipple, no mass, no nipple discharge, no skin change and no tenderness. Left breast exhibits no inverted nipple, no mass, no nipple discharge, no skin change and no tenderness. Breasts are symmetrical.  Abdominal: Soft. Bowel sounds are normal. She exhibits no distension and no mass. There is no tenderness. There is no rebound and no guarding.  Genitourinary:  Genitourinary Comments: Deferred due to normal pap and colonoscopy last year  Musculoskeletal: Normal range of motion. She exhibits no edema or tenderness.  Lymphadenopathy:    She has no cervical adenopathy.  Neurological: She is alert and oriented to person, place, and time.  Skin: Skin is warm and dry. No rash noted. She is not diaphoretic.  Psychiatric: She has a normal mood and affect. Her behavior is normal. Judgment and thought content normal.  Vitals reviewed.   Depression Screen PHQ 2/9 Scores 02/04/2017 11/09/2015  PHQ - 2 Score 0 0  PHQ- 9 Score 0 -   Diabetic Foot Exam - Simple   Simple Foot Form Diabetic Foot exam was performed with the following findings:  Yes 02/04/2017 10:03 AM  Visual Inspection No deformities, no ulcerations, no other skin breakdown bilaterally:  Yes Sensation Testing Intact to touch and monofilament testing bilaterally:  Yes Pulse Check Posterior Tibialis and Dorsalis pulse intact bilaterally:  Yes Comments     Assessment & Plan:     Routine Health Maintenance and  Physical Exam  Exercise Activities and Dietary recommendations Goals    None      Immunization History  Administered Date(s) Administered  . Influenza Split 08/22/2015  . Pneumococcal Polysaccharide-23 12/08/2015    Health Maintenance  Topic Date Due  . FOOT EXAM  04/01/1975  . OPHTHALMOLOGY EXAM  04/01/1975  . HIV Screening  03/31/1980  . TETANUS/TDAP  03/31/1984  . HEMOGLOBIN A1C  10/17/2016  . URINE MICROALBUMIN  12/07/2016  . INFLUENZA VACCINE  05/21/2017  . MAMMOGRAM  12/06/2017  . PAP SMEAR  11/08/2018  . PNEUMOCOCCAL POLYSACCHARIDE VACCINE (2) 12/07/2020  . COLONOSCOPY  03/19/2026     Discussed health benefits of physical activity, and encouraged her to engage in regular exercise appropriate for her age and condition.    1. Annual physical exam Normal physical exam today. Will check labs as below and f/u pending lab results. If labs are stable and WNL she will not need to have these rechecked for one year at her next annual physical exam. She is to call the office in the meantime if she has any acute issue, questions or concerns. - CBC with Differential/Platelet - Comprehensive metabolic panel - TSH  2. Breast cancer screening Breast exam today was normal. There is no family history of breast cancer. She does perform regular self breast exams. Mammogram was ordered as below. Information for [facility] clinic was given to patient so she may schedule her mammogram at her convenience. - MM DIGITAL SCREENING BILATERAL; Future  3. Encounter for lipid screening for cardiovascular disease Previously normal. Will check labs as below and f/u pending results. - Lipid panel  4. Type 2 diabetes mellitus without complication, without long-term current use of insulin (HCC) No longer taking metformin because when she lost weight she started getting dizzy with the medication. Fasting sugars have been fairly stable and normal. Will check labs as below and f/u pending  results. - Comprehensive metabolic panel - Hemoglobin A1c - Lipid panel - POCT UA - Microalbumin  5. Moderate persistent reactive airway disease with acute exacerbation Followed by Dr. Belia Heman.  - Comprehensive metabolic panel  6. Fatigue, unspecified type Will check labs as below and f/u pending results. - CBC with Differential/Platelet - Comprehensive metabolic panel - TSH  --------------------------------------------------------------------    Margaretann Loveless, PA-C  [facility]

## 2017-02-04 NOTE — Patient Instructions (Signed)
Health Maintenance, Female Adopting a healthy lifestyle and getting preventive care can go a long way to promote health and wellness. Talk with your health care provider about what schedule of regular examinations is right for you. This is a good chance for you to check in with your provider about disease prevention and staying healthy. In between checkups, there are plenty of things you can do on your own. Experts have done a lot of research about which lifestyle changes and preventive measures are most likely to keep you healthy. Ask your health care provider for more information. Weight and diet Eat a healthy diet  Be sure to include plenty of vegetables, fruits, low-fat dairy products, and lean protein.  Do not eat a lot of foods high in solid fats, added sugars, or salt.  Get regular exercise. This is one of the most important things you can do for your health.  Most adults should exercise for at least 150 minutes each week. The exercise should increase your heart rate and make you sweat (moderate-intensity exercise).  Most adults should also do strengthening exercises at least twice a week. This is in addition to the moderate-intensity exercise. Maintain a healthy weight  Body mass index (BMI) is a measurement that can be used to identify possible weight problems. It estimates body fat based on height and weight. Your health care provider can help determine your BMI and help you achieve or maintain a healthy weight.  For females 76 years of age and older:  A BMI below 18.5 is considered underweight.  A BMI of 18.5 to 24.9 is normal.  A BMI of 25 to 29.9 is considered overweight.  A BMI of 30 and above is considered obese. Watch levels of cholesterol and blood lipids  You should start having your blood tested for lipids and cholesterol at 52 years of age, then have this test every 5 years.  You may need to have your cholesterol levels checked more often if:  Your lipid or  cholesterol levels are high.  You are older than 52 years of age.  You are at high risk for heart disease. Cancer screening Lung Cancer  Lung cancer screening is recommended for adults 64-42 years old who are at high risk for lung cancer because of a history of smoking.  A yearly low-dose CT scan of the lungs is recommended for people who:  Currently smoke.  Have quit within the past 15 years.  Have at least a 30-pack-year history of smoking. A pack year is smoking an average of one pack of cigarettes a day for 1 year.  Yearly screening should continue until it has been 15 years since you quit.  Yearly screening should stop if you develop a health problem that would prevent you from having lung cancer treatment. Breast Cancer  Practice breast self-awareness. This means understanding how your breasts normally appear and feel.  It also means doing regular breast self-exams. Let your health care provider know about any changes, no matter how small.  If you are in your 20s or 30s, you should have a clinical breast exam (CBE) by a health care provider every 1-3 years as part of a regular health exam.  If you are 34 or older, have a CBE every year. Also consider having a breast X-ray (mammogram) every year.  If you have a family history of breast cancer, talk to your health care provider about genetic screening.  If you are at high risk for breast cancer, talk  to your health care provider about having an MRI and a mammogram every year.  Breast cancer gene (BRCA) assessment is recommended for women who have family members with BRCA-related cancers. BRCA-related cancers include:  Breast.  Ovarian.  Tubal.  Peritoneal cancers.  Results of the assessment will determine the need for genetic counseling and BRCA1 and BRCA2 testing. Cervical Cancer  Your health care provider may recommend that you be screened regularly for cancer of the pelvic organs (ovaries, uterus, and vagina).  This screening involves a pelvic examination, including checking for microscopic changes to the surface of your cervix (Pap test). You may be encouraged to have this screening done every 3 years, beginning at age 24.  For women ages 66-65, health care providers may recommend pelvic exams and Pap testing every 3 years, or they may recommend the Pap and pelvic exam, combined with testing for human papilloma virus (HPV), every 5 years. Some types of HPV increase your risk of cervical cancer. Testing for HPV may also be done on women of any age with unclear Pap test results.  Other health care providers may not recommend any screening for nonpregnant women who are considered low risk for pelvic cancer and who do not have symptoms. Ask your health care provider if a screening pelvic exam is right for you.  If you have had past treatment for cervical cancer or a condition that could lead to cancer, you need Pap tests and screening for cancer for at least 20 years after your treatment. If Pap tests have been discontinued, your risk factors (such as having a new sexual partner) need to be reassessed to determine if screening should resume. Some women have medical problems that increase the chance of getting cervical cancer. In these cases, your health care provider may recommend more frequent screening and Pap tests. Colorectal Cancer  This type of cancer can be detected and often prevented.  Routine colorectal cancer screening usually begins at 52 years of age and continues through 52 years of age.  Your health care provider may recommend screening at an earlier age if you have risk factors for colon cancer.  Your health care provider may also recommend using home test kits to check for hidden blood in the stool.  A small camera at the end of a tube can be used to examine your colon directly (sigmoidoscopy or colonoscopy). This is done to check for the earliest forms of colorectal cancer.  Routine  screening usually begins at age 41.  Direct examination of the colon should be repeated every 5-10 years through 52 years of age. However, you may need to be screened more often if early forms of precancerous polyps or small growths are found. Skin Cancer  Check your skin from head to toe regularly.  Tell your health care provider about any new moles or changes in moles, especially if there is a change in a mole's shape or color.  Also tell your health care provider if you have a mole that is larger than the size of a pencil eraser.  Always use sunscreen. Apply sunscreen liberally and repeatedly throughout the day.  Protect yourself by wearing long sleeves, pants, a wide-brimmed hat, and sunglasses whenever you are outside. Heart disease, diabetes, and high blood pressure  High blood pressure causes heart disease and increases the risk of stroke. High blood pressure is more likely to develop in:  People who have blood pressure in the high end of the normal range (130-139/85-89 mm Hg).  People who are overweight or obese.  People who are African American.  If you are 59-24 years of age, have your blood pressure checked every 3-5 years. If you are 34 years of age or older, have your blood pressure checked every year. You should have your blood pressure measured twice-once when you are at a hospital or clinic, and once when you are not at a hospital or clinic. Record the average of the two measurements. To check your blood pressure when you are not at a hospital or clinic, you can use:  An automated blood pressure machine at a pharmacy.  A home blood pressure monitor.  If you are between 29 years and 60 years old, ask your health care provider if you should take aspirin to prevent strokes.  Have regular diabetes screenings. This involves taking a blood sample to check your fasting blood sugar level.  If you are at a normal weight and have a low risk for diabetes, have this test once  every three years after 52 years of age.  If you are overweight and have a high risk for diabetes, consider being tested at a younger age or more often. Preventing infection Hepatitis B  If you have a higher risk for hepatitis B, you should be screened for this virus. You are considered at high risk for hepatitis B if:  You were born in a country where hepatitis B is common. Ask your health care provider which countries are considered high risk.  Your parents were born in a high-risk country, and you have not been immunized against hepatitis B (hepatitis B vaccine).  You have HIV or AIDS.  You use needles to inject street drugs.  You live with someone who has hepatitis B.  You have had sex with someone who has hepatitis B.  You get hemodialysis treatment.  You take certain medicines for conditions, including cancer, organ transplantation, and autoimmune conditions. Hepatitis C  Blood testing is recommended for:  Everyone born from [birth year] through 1966.  Anyone with known risk factors for hepatitis C. Sexually transmitted infections (STIs)  You should be screened for sexually transmitted infections (STIs) including gonorrhea and chlamydia if:  You are sexually active and are younger than 52 years of age.  You are older than 52 years of age and your health care provider tells you that you are at risk for this type of infection.  Your sexual activity has changed since you were last screened and you are at an increased risk for chlamydia or gonorrhea. Ask your health care provider if you are at risk.  If you do not have HIV, but are at risk, it may be recommended that you take a prescription medicine daily to prevent HIV infection. This is called pre-exposure prophylaxis (PrEP). You are considered at risk if:  You are sexually active and do not regularly use condoms or know the HIV status of your partner(s).  You take drugs by injection.  You are sexually active with a partner  who has HIV. Talk with your health care provider about whether you are at high risk of being infected with HIV. If you choose to begin PrEP, you should first be tested for HIV. You should then be tested every 3 months for as long as you are taking PrEP. Pregnancy  If you are premenopausal and you may become pregnant, ask your health care provider about preconception counseling.  If you may become pregnant, take 400 to 800 micrograms (mcg) of folic acid  every day.  If you want to prevent pregnancy, talk to your health care provider about birth control (contraception). Osteoporosis and menopause  Osteoporosis is a disease in which the bones lose minerals and strength with aging. This can result in serious bone fractures. Your risk for osteoporosis can be identified using a bone density scan.  If you are 4 years of age or older, or if you are at risk for osteoporosis and fractures, ask your health care provider if you should be screened.  Ask your health care provider whether you should take a calcium or vitamin D supplement to lower your risk for osteoporosis.  Menopause may have certain physical symptoms and risks.  Hormone replacement therapy may reduce some of these symptoms and risks. Talk to your health care provider about whether hormone replacement therapy is right for you. Follow these instructions at home:  Schedule regular health, dental, and eye exams.  Stay current with your immunizations.  Do not use any tobacco products including cigarettes, chewing tobacco, or electronic cigarettes.  If you are pregnant, do not drink alcohol.  If you are breastfeeding, limit how much and how often you drink alcohol.  Limit alcohol intake to no more than 1 drink per day for nonpregnant women. One drink equals 12 ounces of beer, 5 ounces of wine, or 1 ounces of hard liquor.  Do not use street drugs.  Do not share needles.  Ask your health care provider for help if you need support  or information about quitting drugs.  Tell your health care provider if you often feel depressed.  Tell your health care provider if you have ever been abused or do not feel safe at home. This information is not intended to replace advice given to you by your health care provider. Make sure you discuss any questions you have with your health care provider. Document Released: 04/22/2011 Document Revised: 03/14/2016 Document Reviewed: 07/11/2015 Elsevier Interactive Patient Education  2017 [employer].

## 2017-02-13 ENCOUNTER — Telehealth: Payer: Self-pay

## 2017-02-13 LAB — COMPREHENSIVE METABOLIC PANEL
A/G RATIO: 2 (ref 1.2–2.2)
ALBUMIN: 4.1 g/dL (ref 3.5–5.5)
ALT: 14 IU/L (ref 0–32)
AST: 11 IU/L (ref 0–40)
Alkaline Phosphatase: 96 IU/L (ref 39–117)
BUN/Creatinine Ratio: 16 (ref 9–23)
BUN: 14 mg/dL (ref 6–24)
Bilirubin Total: 0.4 mg/dL (ref 0.0–1.2)
CALCIUM: 9.5 mg/dL (ref 8.7–10.2)
CO2: 26 mmol/L (ref 18–29)
Chloride: 102 mmol/L (ref 96–106)
Creatinine, Ser: 0.86 mg/dL (ref 0.57–1.00)
GFR, EST AFRICAN AMERICAN: 90 mL/min/{1.73_m2} (ref >59)
GFR, EST NON AFRICAN AMERICAN: 78 mL/min/{1.73_m2} (ref >59)
GLOBULIN, TOTAL: 2.1 g/dL (ref 1.5–4.5)
Glucose: 111 mg/dL — ABNORMAL HIGH (ref 65–99)
POTASSIUM: 4.9 mmol/L (ref 3.5–5.2)
SODIUM: 143 mmol/L (ref 134–144)
TOTAL PROTEIN: 6.2 g/dL (ref 6.0–8.5)

## 2017-02-13 LAB — LIPID PANEL
CHOLESTEROL TOTAL: 168 mg/dL (ref 100–199)
Chol/HDL Ratio: 3.2 ratio (ref 0.0–4.4)
HDL: 53 mg/dL (ref >39)
LDL CALC: 99 mg/dL (ref 0–99)
Triglycerides: 78 mg/dL (ref 0–149)
VLDL CHOLESTEROL CAL: 16 mg/dL (ref 5–40)

## 2017-02-13 LAB — TSH: TSH: 1.89 u[IU]/mL (ref 0.450–4.500)

## 2017-02-13 LAB — CBC WITH DIFFERENTIAL/PLATELET
BASOS: 0 %
Basophils Absolute: 0 10*3/uL (ref 0.0–0.2)
EOS (ABSOLUTE): 0.2 10*3/uL (ref 0.0–0.4)
EOS: 2 %
HEMATOCRIT: 42.6 % (ref 34.0–46.6)
HEMOGLOBIN: 14.4 g/dL (ref 11.1–15.9)
IMMATURE GRANS (ABS): 0 10*3/uL (ref 0.0–0.1)
Immature Granulocytes: 0 %
LYMPHS: 43 %
Lymphocytes Absolute: 3.3 10*3/uL — ABNORMAL HIGH (ref 0.7–3.1)
MCH: 27.2 pg (ref 26.6–33.0)
MCHC: 33.8 g/dL (ref 31.5–35.7)
MCV: 80 fL (ref 79–97)
Monocytes Absolute: 0.4 10*3/uL (ref 0.1–0.9)
Monocytes: 6 %
Neutrophils Absolute: 3.7 10*3/uL (ref 1.4–7.0)
Neutrophils: 49 %
Platelets: 266 10*3/uL (ref 150–379)
RBC: 5.3 x10E6/uL — ABNORMAL HIGH (ref 3.77–5.28)
RDW: 14.2 % (ref 12.3–15.4)
WBC: 7.6 10*3/uL (ref 3.4–10.8)

## 2017-02-13 LAB — HEMOGLOBIN A1C
Est. average glucose Bld gHb Est-mCnc: 128 mg/dL
Hgb A1c MFr Bld: 6.1 % — ABNORMAL HIGH (ref 4.8–5.6)

## 2017-02-13 NOTE — Telephone Encounter (Signed)
-----   Message from Margaretann Loveless, [place] sent at 02/13/2017  8:31 AM EDT ----- A1c continues to improve and is down to 6.1. Fasting sugars are improved as well. Cholesterol is WNL. All other labs are WNL.

## 2017-02-13 NOTE — Telephone Encounter (Signed)
Patient advised as directed below.  Thanks,  -Milley Vining 

## 2017-02-22 IMAGING — CR DG CHEST 2V
1 series · 2 of 2 positions shown · non-contrast
Comparison: Portable chest x-ray October 14, 2015

CLINICAL DATA: Wheezing and shortness of breath for the past 11
days; history of asthma, diabetes.

EXAM:
CHEST  2 VIEW

[Series 1: dg chest 2 view · 0.14mm/px · 2 of 2 slices shown]
[im 1/2]
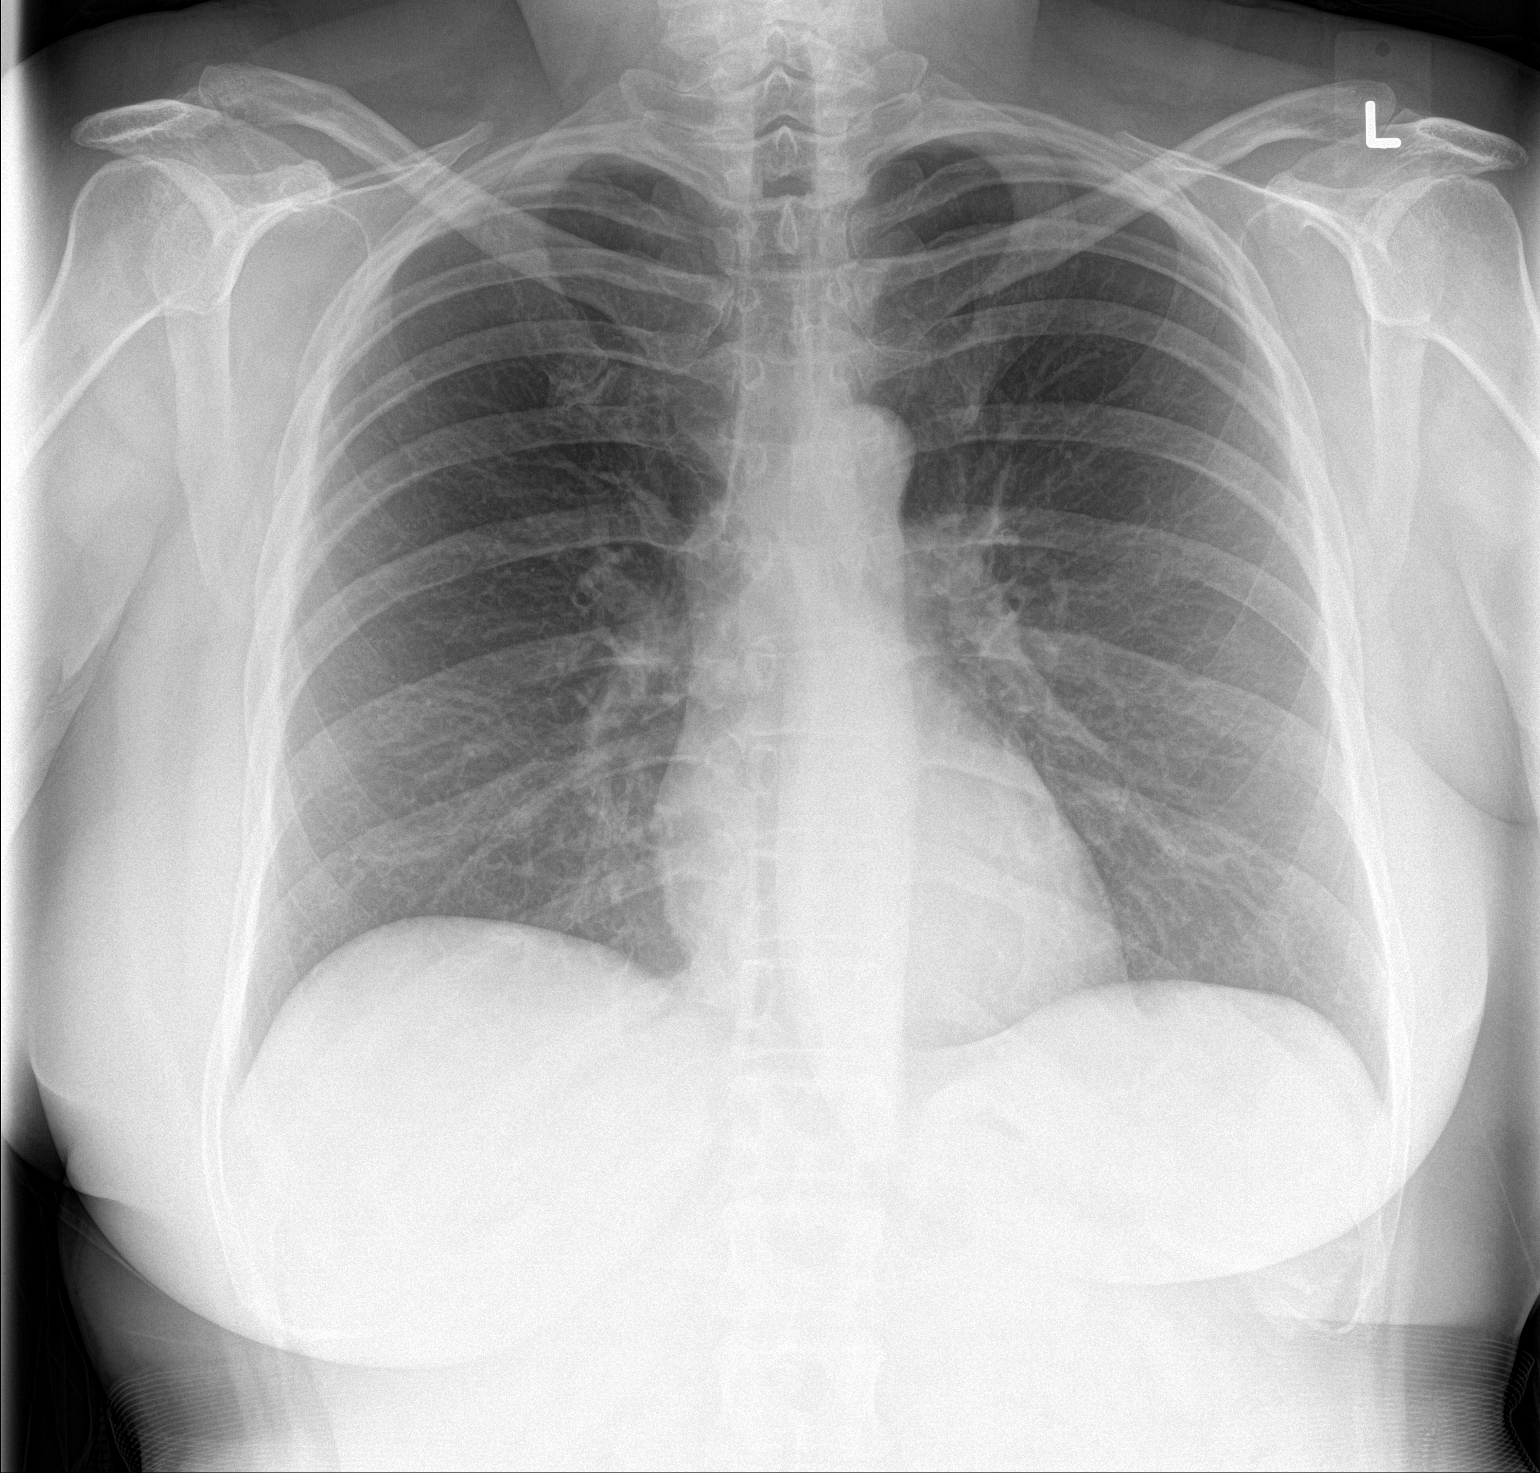
[im 2/2]
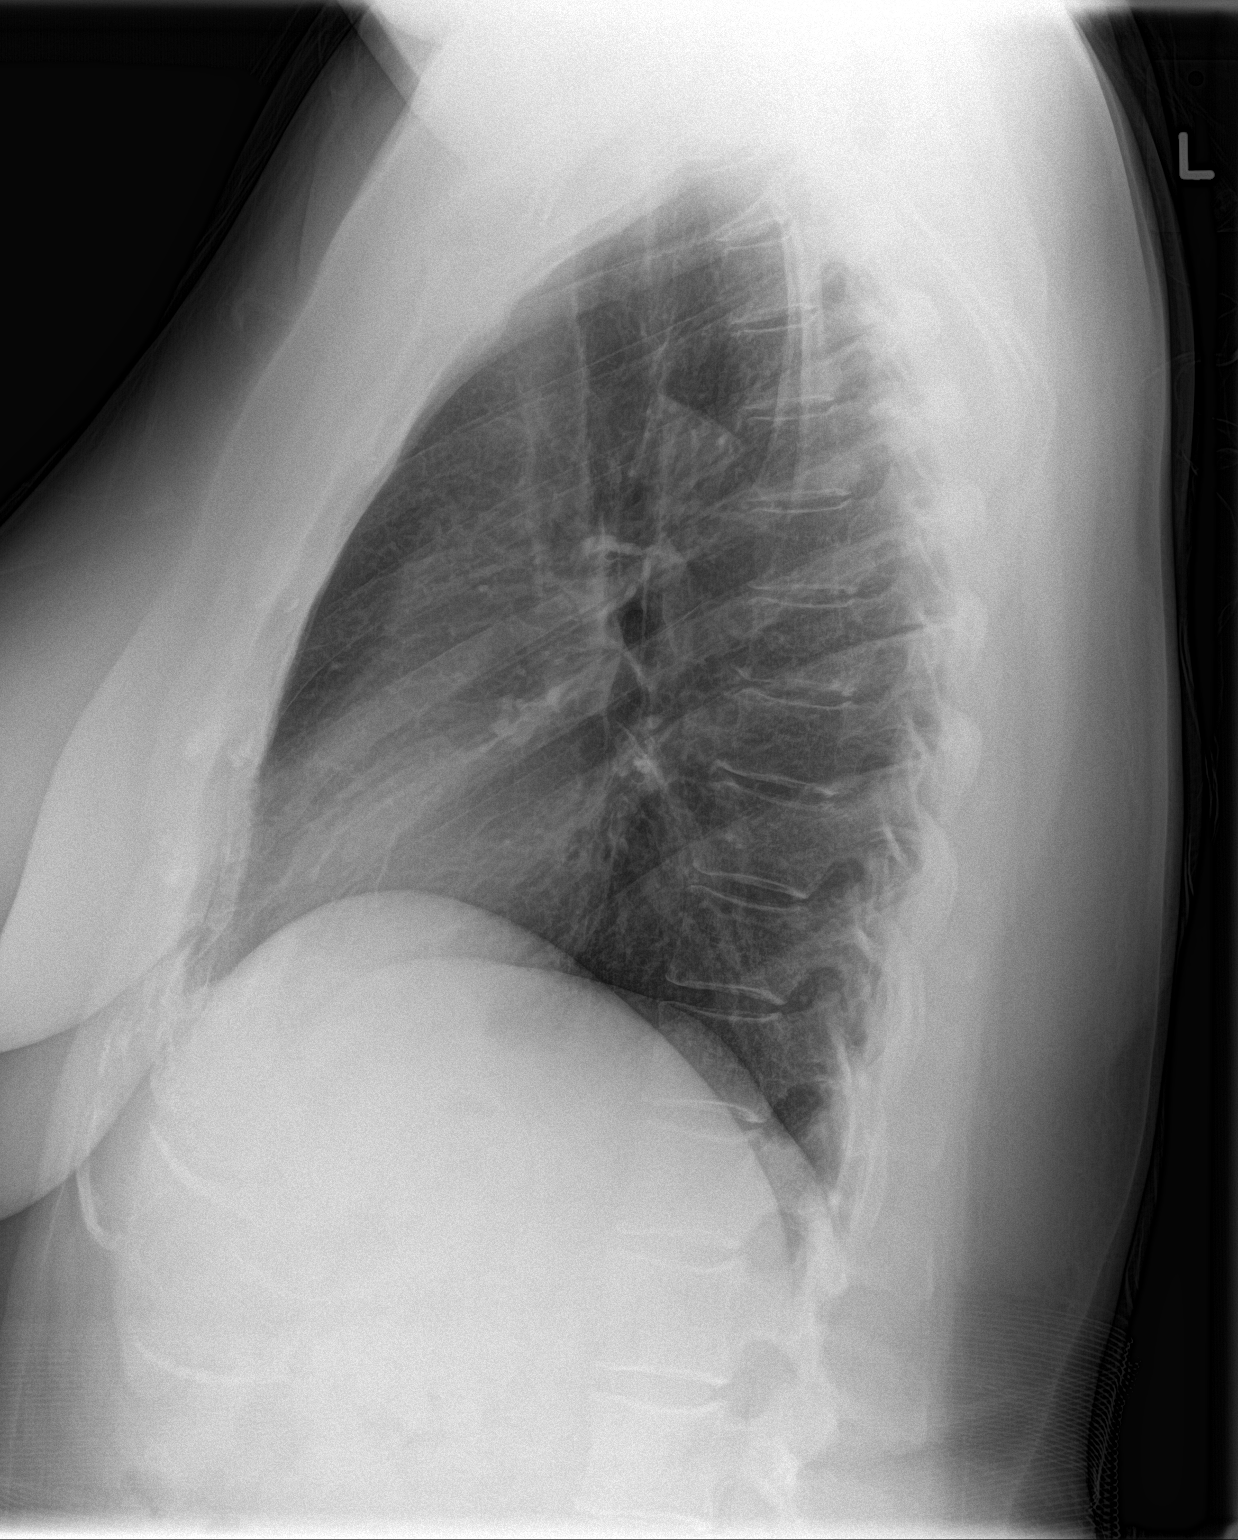

[2 of 2 positions shown; findings below may reference images not displayed]

FINDINGS: The lungs are well-expanded. There is no focal infiltrate. There is
no pleural effusion. The heart and pulmonary vascularity are normal.
The mediastinum is normal in width. The trachea is midline. The bony
thorax exhibits no acute abnormality.
IMPRESSION: There is no active cardiopulmonary disease.

## 2017-03-04 ENCOUNTER — Telehealth: Payer: Self-pay

## 2017-03-04 ENCOUNTER — Ambulatory Visit
Admission: RE | Admit: 2017-03-04 | Discharge: 2017-03-04 | Disposition: A | Discharge status: Home / Self Care | Payer: 59 | Source: Ambulatory Visit | Attending: Physician Assistant | Admitting: Physician Assistant

## 2017-03-04 DIAGNOSIS — Z1239 Encounter for other screening for malignant neoplasm of breast: Secondary | ICD-10-CM

## 2017-03-04 DIAGNOSIS — Z1231 Encounter for screening mammogram for malignant neoplasm of breast: Secondary | ICD-10-CM | POA: Diagnosis not present

## 2017-03-04 NOTE — Telephone Encounter (Signed)
Patient advised as below.  

## 2017-03-04 NOTE — Telephone Encounter (Signed)
-----   Message from Margaretann LovelessJennifer M Burnette, PA-C sent at 03/04/2017 10:26 AM EDT ----- Normal mammogram. Repeat screening in one year.

## 2017-08-07 ENCOUNTER — Ambulatory Visit (INDEPENDENT_AMBULATORY_CARE_PROVIDER_SITE_OTHER): Payer: 59 | Admitting: Internal Medicine

## 2017-08-07 ENCOUNTER — Encounter: Payer: Self-pay | Admitting: Internal Medicine

## 2017-08-07 VITALS — BP 118/78 | HR 103 | Resp 16 | Ht 64.0 in | Wt 226.0 lb

## 2017-08-07 DIAGNOSIS — G4719 Other hypersomnia: Secondary | ICD-10-CM | POA: Diagnosis not present

## 2017-08-07 DIAGNOSIS — J4520 Mild intermittent asthma, uncomplicated: Secondary | ICD-10-CM | POA: Diagnosis not present

## 2017-08-07 MED ORDER — ALBUTEROL SULFATE HFA 108 (90 BASE) MCG/ACT IN AERS: 2.0000 | INHALATION_SPRAY | Freq: Four times a day (QID) | RESPIRATORY_TRACT | 2 refills | Status: DC | PRN

## 2017-08-07 NOTE — Patient Instructions (Addendum)
Continue inhalers as prescribed Obtain sleep study to assess for sleep apnea

## 2017-08-07 NOTE — Progress Notes (Signed)
[facility]      Date: 08/07/2017,   MRN# [id number] Nancy ClicheKimberly W [facility] 1966-04-19 Code Status:  Code Status History    Date Active Date Inactive Code Status Order ID Comments User Context   10/14/2015  2:23 PM 10/16/2015  1:29 PM Full Code [id number]  Arnaldo NatalMichael S Diamond, MD Inpatient         Admission                  Current  Nancy DarnerKimberly W Gritz is a 51 y.o. old female seen in for acute care visit of cough, nasal congestion, sob, and chest tightness     CHIEF COMPLAINT:  Follow up asthma     HISTORY OF PRESENT ILLNESS  Patient had asthma exacerbation last 4 months ago month-given prednisone and doxy  Patient Had stopped taking her inhalers to assess her resp status She restarted BREO due to increased SOB and wheezing  Breathing well overall Compliant with meds  Current Regimen: BREO 200, flonase, zyrtec She uses Flonase as needed     PFT  01/03/2016 reviewed with patient Ratio 88% FEV1 98% FVC 87% DLCO 95%  6MWT WNL   no signs of infection at this time  Patient has been having excessive daytime sleepiness Patient has been having extreme fatigue and tiredness, lack of energy +  very Loud snoring every night EPWORTH sleep score 11   Current Medication:  Current Outpatient Prescriptions:  .  albuterol (PROVENTIL HFA;VENTOLIN HFA) 108 (90 Base) MCG/ACT inhaler, Inhale 2 puffs into the lungs every 6 (six) hours as needed for wheezing or shortness of breath., Disp: 1 Inhaler, Rfl: 2 .  cetirizine (ZYRTEC) 10 MG tablet, Take 1 tablet (10 mg total) by mouth daily., Disp: 90 tablet, Rfl: 3 .  fluticasone (FLONASE) 50 MCG/ACT nasal spray, Place 2 sprays into both nostrils daily., Disp: 16 g, Rfl: 8 .  fluticasone furoate-vilanterol (BREO ELLIPTA) 200-25 MCG/INH AEPB, Inhale 1 puff into the lungs daily., Disp: 180 each, Rfl: 2 .  glucose blood test strip, Use as instructed, Disp: 100 each, Rfl: 12 .  ibuprofen (ADVIL,MOTRIN) 200 MG  tablet, Take 600 mg by mouth every 6 (six) hours as needed for mild pain or moderate pain., Disp: , Rfl:  .  ipratropium-albuterol (DUONEB) 0.5-2.5 (3) MG/3ML SOLN, Take 3 mLs by nebulization every 4 (four) hours as needed., Disp: 360 mL, Rfl: 1 .  lansoprazole (PREVACID) 15 MG capsule, Take 15 mg by mouth daily at 12 noon., Disp: , Rfl:  .  Multiple Vitamin (MULTIVITAMIN) tablet, Take 1 tablet by mouth daily., Disp: , Rfl:     ALLERGIES   Patient has no known allergies.     REVIEW OF SYSTEMS   Review of Systems  Constitutional: Negative for chills, fever and weight loss.  HENT: Negative for congestion and sore throat.   Eyes: Negative for blurred vision.  Respiratory: Negative for cough, hemoptysis, sputum production, shortness of breath and wheezing.   Cardiovascular: Negative for chest pain, palpitations and leg swelling.  Gastrointestinal: Negative for heartburn and nausea.  All other systems reviewed and are negative.    VS: Ht 5\' 4"  (1.626 m)    BP 118/78 (BP Location: Left Arm, Cuff Size: Large)   Pulse (!) 103   Resp 16   Ht 5\' 4"  (1.626 m)   Wt 226 lb (102.5 kg)   SpO2 97%   BMI 38.79 kg/m    PHYSICAL EXAM  Physical Exam  Constitutional: She is  oriented to person, place, and time. No distress.  Cardiovascular: Normal rate, regular rhythm and normal heart sounds.   No murmur heard. Pulmonary/Chest: No stridor. No respiratory distress. She has no wheezes. She has no rales.  Musculoskeletal: She exhibits no edema.  Neurological: She is alert and oriented to person, place, and time. No cranial nerve deficit.  Skin: Skin is warm. She is not diaphoretic.  Psychiatric: She has a normal mood and affect.       ASSESSMENT/PLAN  51 yo white female seen today for signs and symptoms of persistent allerghic rhinitis with reactive ariway disease c/w ASTHMA-mild intermittent with signs and symptoms of excessive daytime sleepiness along with fatigue with witnessed  snoring which can be related to underlying sleep apnea in the setting of obesity and deconditioned state   Allergic rhinitis Continue intranasal steroids as needed Discussed use of air purifier in bedroom zyrtec 10 mg daily  pillows and sheets in drier and works well to kil dust mites  Asthma mild intermittent-well controlled now Continue breo continue albuterol and dounebs as needed  GERD-continue PPI  Obesity -recommend significant weight loss -recommend changing diet  Deconditioned state -Recommend increased daily activity and exercise  Excessive daytime sleepiness Obtain sleep study  Follow up in 6 months  Patient satisfied with Plan of action and management. All questions answered  Lucie Leather, M.D.  [facility]  Medical Director [facility] Medical Director [facility] Cardio-Pulmonary Department

## 2017-08-15 ENCOUNTER — Other Ambulatory Visit: Payer: Self-pay | Admitting: Internal Medicine

## 2017-08-15 MED ORDER — CETIRIZINE HCL 10 MG PO TABS: 10.0000 mg | ORAL_TABLET | Freq: Every day | ORAL | 3 refills | Status: DC

## 2017-08-15 NOTE — Telephone Encounter (Signed)
Paper Rx request came in from [facility].

## 2017-08-18 ENCOUNTER — Telehealth: Payer: Self-pay | Admitting: Internal Medicine

## 2017-08-18 NOTE — Telephone Encounter (Signed)
Per Lillia A[facility]Lindsay at [facility], [facility]  Per Lillia AbedLindsay they will contact patient closer to appointment and move appointment if they haven't received notification from insurance. Rhonda J Cobb

## 2017-08-18 NOTE — Telephone Encounter (Signed)
Returned call to patient and let her know our office does not do PA for sleep studies. If patient has any questions she should refer to [facility]

## 2017-08-18 NOTE — Telephone Encounter (Signed)
Pt calling to see if Sleep study has been approved by insurance   Please call back

## 2017-09-02 ENCOUNTER — Telehealth: Payer: Self-pay | Admitting: Internal Medicine

## 2017-09-02 DIAGNOSIS — G4719 Other hypersomnia: Secondary | ICD-10-CM

## 2017-09-02 NOTE — Telephone Encounter (Signed)
Done. ss

## 2017-09-02 NOTE — Telephone Encounter (Signed)
Nancy Blake with [facility] called and stated that [facility]  Please cancel in lab order and place order for HST.  Pt has been advised of the above. Nancy Blake

## 2017-09-15 ENCOUNTER — Encounter: Payer: Self-pay | Admitting: Internal Medicine

## 2017-09-15 DIAGNOSIS — G4719 Other hypersomnia: Secondary | ICD-10-CM

## 2017-09-23 ENCOUNTER — Telehealth: Payer: Self-pay | Admitting: *Deleted

## 2017-09-23 DIAGNOSIS — G4733 Obstructive sleep apnea (adult) (pediatric): Secondary | ICD-10-CM

## 2017-09-23 NOTE — Telephone Encounter (Signed)
Patient aware of sleep study. Orders have been placed.

## 2017-11-20 ENCOUNTER — Telehealth: Payer: Self-pay | Admitting: Internal Medicine

## 2017-11-20 MED ORDER — BENZONATATE 200 MG PO CAPS: 200.0000 mg | ORAL_CAPSULE | Freq: Three times a day (TID) | ORAL | 1 refills | Status: DC | PRN

## 2017-11-20 MED ORDER — PREDNISONE 20 MG PO TABS: 40.0000 mg | ORAL_TABLET | Freq: Every day | ORAL | 0 refills | Status: DC

## 2017-11-20 NOTE — Telephone Encounter (Signed)
Please advise on message below.

## 2017-11-20 NOTE — Telephone Encounter (Signed)
Pt also ask if we can send her in some tessalon pearles. Per DK, send Tessalon Pearles 200mg  TID PRN. RXs sent. Pt informed. Nothing further needed.

## 2017-11-20 NOTE — Telephone Encounter (Signed)
Pt calling stating past days she's have tightness in chest along with cough Wanted to know if we can call something in to help ease this   [facility] [address]

## 2017-11-20 NOTE — Telephone Encounter (Signed)
Prednisone 40 mg daily for 7 days 

## 2017-12-11 ENCOUNTER — Ambulatory Visit: Payer: 59 | Admitting: Internal Medicine

## 2017-12-29 ENCOUNTER — Encounter: Payer: Self-pay | Admitting: Internal Medicine

## 2017-12-29 ENCOUNTER — Ambulatory Visit (INDEPENDENT_AMBULATORY_CARE_PROVIDER_SITE_OTHER): Payer: Managed Care, Other (non HMO) | Admitting: Internal Medicine

## 2017-12-29 VITALS — BP 150/80 | HR 77 | Resp 16 | Ht 64.0 in | Wt 237.0 lb

## 2017-12-29 DIAGNOSIS — G4733 Obstructive sleep apnea (adult) (pediatric): Secondary | ICD-10-CM | POA: Diagnosis not present

## 2017-12-29 DIAGNOSIS — J4520 Mild intermittent asthma, uncomplicated: Secondary | ICD-10-CM

## 2017-12-29 MED ORDER — FLUTICASONE FUROATE-VILANTEROL 200-25 MCG/INH IN AEPB: 1.0000 | INHALATION_SPRAY | Freq: Every day | RESPIRATORY_TRACT | 2 refills | Status: DC

## 2017-12-29 MED ORDER — ALBUTEROL SULFATE HFA 108 (90 BASE) MCG/ACT IN AERS: 2.0000 | INHALATION_SPRAY | Freq: Four times a day (QID) | RESPIRATORY_TRACT | 3 refills | Status: DC | PRN

## 2017-12-29 NOTE — Progress Notes (Signed)
[facility]      Date: 12/29/2017,   MRN# [id number] Nancy Blake    AdmissionWeight: 237 lb (107.5 kg)                 CurrentWeight: 237 lb (107.5 kg) Nancy Blake is a 52 y.o. old female seen in for acute care visit of cough, nasal congestion, sob, and chest tightness     CHIEF COMPLAINT:  Follow up asthma  New dx of OSA    HISTORY OF PRESENT ILLNESS    Breathing well overall Compliant with meds  Current Regimen: BREO 200, flonase, zyrtec She uses Flonase as needed  PFT  01/03/2016 reviewed with patient Ratio 88% FEV1 98% FVC 87% DLCO 95%  6MWT WNL  no signs of infection at this time No signs of CHF at this time  New dx of OSA AHI was 28, also dropped 02 sats to 77% Patient started auotoCPAP AHI down to 0.6 93% compliant with days 63% with >4 hrs  No signs of infection at this time No signs of CHF  Fatigue has improved Daytime sleepiness also improved    Current Medication:  Current Outpatient Medications:  .  albuterol (PROVENTIL HFA;VENTOLIN HFA) 108 (90 Base) MCG/ACT inhaler, Inhale 2 puffs into the lungs every 6 (six) hours as needed for wheezing or shortness of breath., Disp: 1 Inhaler, Rfl: 2 .  benzonatate (TESSALON) 200 MG capsule, Take 1 capsule (200 mg total) by mouth 3 (three) times daily as needed for cough., Disp: 30 capsule, Rfl: 1 .  cetirizine (ZYRTEC) 10 MG tablet, Take 1 tablet (10 mg total) by mouth daily., Disp: 90 tablet, Rfl: 3 .  fluticasone furoate-vilanterol (BREO ELLIPTA) 200-25 MCG/INH AEPB, Inhale 1 puff into the lungs daily., Disp: 180 each, Rfl: 2 .  glucose blood test strip, Use as instructed, Disp: 100 each, Rfl: 12 .  ibuprofen (ADVIL,MOTRIN) 200 MG tablet, Take 600 mg by mouth every 6 (six) hours as needed for mild pain or moderate pain., Disp: , Rfl:  .  ipratropium-albuterol (DUONEB) 0.5-2.5 (3) MG/3ML SOLN, Take 3 mLs by nebulization every 4 (four) hours as needed.,  Disp: 360 mL, Rfl: 1 .  lansoprazole (PREVACID) 15 MG capsule, Take 15 mg by mouth daily at 12 noon., Disp: , Rfl:  .  Multiple Vitamin (MULTIVITAMIN) tablet, Take 1 tablet by mouth daily., Disp: , Rfl:  .  fluticasone (FLONASE) 50 MCG/ACT nasal spray, Place 2 sprays into both nostrils daily., Disp: 16 g, Rfl: 8    ALLERGIES   Patient has no known allergies.     REVIEW OF SYSTEMS   Review of Systems  Constitutional: Negative for chills, fever and weight loss.  HENT: Negative for congestion and sore throat.   Eyes: Negative for blurred vision.  Respiratory: Negative for cough, hemoptysis, sputum production, shortness of breath and wheezing.   Cardiovascular: Negative for chest pain, palpitations and leg swelling.  Gastrointestinal: Negative for heartburn and nausea.  All other systems reviewed and are negative.    VS: BP (!) 150/80 (BP Location: Left Arm, Cuff Size: Large)   Pulse 77   Resp 16   Ht 5\' 4"  (1.626 m)   Wt 237 lb (107.5 kg)   SpO2 98%   BMI 40.68 kg/m    BP (!) 150/80 (BP Location: Left Arm, Cuff Size: Large)   Pulse 77   Resp 16   Ht 5\' 4"  (1.626 m)   Wt 237 lb (107.5 kg)  SpO2 98%   BMI 40.68 kg/m    PHYSICAL EXAM  Physical Exam  Constitutional: She is oriented to person, place, and time. No distress.  Cardiovascular: Normal rate, regular rhythm and normal heart sounds.  No murmur heard. Pulmonary/Chest: No stridor. No respiratory distress. She has no wheezes. She has no rales.  Musculoskeletal: She exhibits no edema.  Neurological: She is alert and oriented to person, place, and time. No cranial nerve deficit.  Skin: Skin is warm. She is not diaphoretic.  Psychiatric: She has a normal mood and affect.       ASSESSMENT/PLAN  52 yo white female seen today for signs and symptoms of persistent allerghic rhinitis with reactive ariway disease c/w ASTHMA-mild intermittent with OSA with setting of obesity and deconditioned state   Allergic  rhinitis Continue intranasal steroids as needed Discussed use of air purifier in bedroom zyrtec 10 mg daily  pillows and sheets in drier and works well to kill dust mites  Asthma mild intermittent-well controlled now Continue breo continue albuterol and dounebs as needed  GERD-continue PPI  Obesity -recommend significant weight loss -recommend changing diet  Deconditioned state -Recommend increased daily activity and exercise  Excessive daytime sleepiness +OSA Continue autoCPAP 5-20 as prescribed AHI down to 0.6 Increase compliance recommended >4hr per night   Follow up in 6 months  Patient satisfied with Plan of action and management. All questions answered  Lucie Leather, M.D.  [facility]  Medical Director [facility] Medical Director [facility] Cardio-Pulmonary Department

## 2017-12-29 NOTE — Patient Instructions (Signed)
Continue inhalers as needed Continue therapy for sleep apnea Recommend weight loss

## 2018-01-06 ENCOUNTER — Telehealth: Payer: Self-pay | Admitting: Internal Medicine

## 2018-01-06 NOTE — Telephone Encounter (Signed)
Pt was asking if she should be concerned about her BP at her last visit with [facility]. Informed pt what it was and that she should check it again and if still elevated to contact her PCP for further workup. Pt verbalized understanding. Nothing further needed.

## 2018-01-06 NOTE — Telephone Encounter (Signed)
Patient is calling in regards to last appt Has a question on something that was mentioned at appt and would like to discuss with nurse Please call

## 2018-01-07 ENCOUNTER — Ambulatory Visit (INDEPENDENT_AMBULATORY_CARE_PROVIDER_SITE_OTHER): Payer: Managed Care, Other (non HMO) | Admitting: Family Medicine

## 2018-01-07 ENCOUNTER — Encounter: Payer: Self-pay | Admitting: Family Medicine

## 2018-01-07 VITALS — BP 132/80 | HR 87 | Temp 98.5°F | Resp 16 | Wt 240.0 lb

## 2018-01-07 DIAGNOSIS — R030 Elevated blood-pressure reading, without diagnosis of hypertension: Secondary | ICD-10-CM | POA: Diagnosis not present

## 2018-01-07 NOTE — Progress Notes (Signed)
Patient: Nancy DarnerKimberly W Abram Female    DOB: 1965-12-06   52 y.o.   MRN: [id number] Visit Date: 01/07/2018  Today's Provider: Shirlee LatchAngela Raelene Trew, MD   I, Joslyn HyEmily Ratchford, CMA, am acting as scribe for Shirlee LatchAngela Janeann Paisley, MD.  Chief Complaint  Patient presents with  . Elevated Blood Pressure   Subjective:    HPI   Pt saw pulmonology on 12/29/2017, and her BP at that time was 150/80. She is experiencing dizziness, headaches, and some ankle swelling. Denies chest pain, palpitations, syncope. States she does have SOB, which she attributes to asthma. She is also experiencing fatigue, which is improving with CPAP (recent OSA diagnosis).  She is concerned as her mother has vascular dementia.  She is also stressed as her daughter is getting married in a few weeks.  She is frustrated that she has gained back the 40lbs that she lost while planning this wedding.  BP Readings from Last 3 Encounters:  01/07/18 132/80  12/29/17 (!) 150/80  08/07/17 118/78    No Known Allergies   Current Outpatient Medications:  .  acetaminophen (TYLENOL) 500 MG tablet, Take 500 mg by mouth every 6 (six) hours as needed., Disp: , Rfl:  .  albuterol (PROVENTIL HFA;VENTOLIN HFA) 108 (90 Base) MCG/ACT inhaler, Inhale 2 puffs into the lungs every 6 (six) hours as needed for wheezing or shortness of breath., Disp: 1 Inhaler, Rfl: 3 .  benzonatate (TESSALON) 200 MG capsule, Take 1 capsule (200 mg total) by mouth 3 (three) times daily as needed for cough., Disp: 30 capsule, Rfl: 1 .  cetirizine (ZYRTEC) 10 MG tablet, Take 1 tablet (10 mg total) by mouth daily., Disp: 90 tablet, Rfl: 3 .  fluticasone (FLONASE) 50 MCG/ACT nasal spray, Place 2 sprays into both nostrils daily., Disp: 16 g, Rfl: 8 .  fluticasone furoate-vilanterol (BREO ELLIPTA) 200-25 MCG/INH AEPB, Inhale 1 puff into the lungs daily., Disp: 180 each, Rfl: 2 .  glucose blood test strip, Use as instructed, Disp: 100 each, Rfl: 12 .  ibuprofen (ADVIL,MOTRIN)  200 MG tablet, Take 600 mg by mouth every 6 (six) hours as needed for mild pain or moderate pain., Disp: , Rfl:  .  ipratropium-albuterol (DUONEB) 0.5-2.5 (3) MG/3ML SOLN, Take 3 mLs by nebulization every 4 (four) hours as needed., Disp: 360 mL, Rfl: 1 .  lansoprazole (PREVACID) 15 MG capsule, Take 15 mg by mouth daily at 12 noon., Disp: , Rfl:  .  Multiple Vitamin (MULTIVITAMIN) tablet, Take 1 tablet by mouth daily., Disp: , Rfl:   Review of Systems  Constitutional: Positive for fatigue. Negative for activity change, appetite change, chills, diaphoresis, fever and unexpected weight change.  Respiratory: Positive for shortness of breath (asthma).   Cardiovascular: Positive for leg swelling. Negative for chest pain and palpitations.  Endocrine: Positive for heat intolerance.  Neurological: Positive for dizziness and headaches. Negative for syncope.    Social History   Tobacco Use  . Smoking status: Former Smoker    Last attempt to quit: 10/21/1983    Years since quitting: 34.2  . Smokeless tobacco: Never Used  Substance Use Topics  . Alcohol use: No   Objective:   BP 132/80 (BP Location: Right Arm, Patient Position: Sitting, Cuff Size: Large)   Pulse 87   Temp 98.5 F (36.9 C) (Oral)   Resp 16   Wt 240 lb (108.9 kg)   SpO2 99%   BMI 41.20 kg/m  Vitals:   01/07/18 0823 01/07/18 0824  BP:  124/82 132/80  Pulse:  87  Resp:  16  Temp:  98.5 F (36.9 C)  TempSrc:  Oral  SpO2:  99%  Weight:  240 lb (108.9 kg)     Physical Exam  Constitutional: She is oriented to person, place, and time. She appears well-developed and well-nourished.  HENT:  Head: Normocephalic and atraumatic.  Eyes: Conjunctivae are normal. No scleral icterus.  Neck: Neck supple. No thyromegaly present.  Cardiovascular: Normal rate, regular rhythm, normal heart sounds and intact distal pulses.  No murmur heard. Pulmonary/Chest: Effort normal and breath sounds normal. No respiratory distress. She has no  wheezes. She has no rales.  Musculoskeletal: She exhibits no edema.  Lymphadenopathy:    She has no cervical adenopathy.  Neurological: She is alert and oriented to person, place, and time.  Psychiatric: She has a normal mood and affect. Her behavior is normal.        Assessment & Plan:    1. Elevated BP without diagnosis of hypertension - normal today - discussed with patient that one time elevation could be related to stress, wrong BP cuff size, etc - reassurance given - DASH diet advised - return precautions discussed - we will continue to monitor  Return if symptoms worsen or fail to improve.   The entirety of the information documented in the History of Present Illness, Review of Systems and Physical Exam were personally obtained by me. Portions of this information were initially documented by Irving Burton Ratchford, CMA and reviewed by me for thoroughness and accuracy.    Erasmo Downer, MD, MPH [facility] 01/07/2018 8:47 AM

## 2018-01-07 NOTE — Patient Instructions (Signed)
DASH Eating Plan DASH stands for "Dietary Approaches to Stop Hypertension." The DASH eating plan is a healthy eating plan that has been shown to reduce high blood pressure (hypertension). It may also reduce your risk for type 2 diabetes, heart disease, and stroke. The DASH eating plan may also help with weight loss. What are tips for following this plan? General guidelines  Avoid eating more than 2,300 mg (milligrams) of salt (sodium) a day. If you have hypertension, you may need to reduce your sodium intake to 1,500 mg a day.  Limit alcohol intake to no more than 1 drink a day for nonpregnant women and 2 drinks a day for men. One drink equals 12 oz of beer, 5 oz of wine, or 1 oz of hard liquor.  Work with your health care provider to maintain a healthy body weight or to lose weight. Ask what an ideal weight is for you.  Get at least 30 minutes of exercise that causes your heart to beat faster (aerobic exercise) most days of the week. Activities may include walking, swimming, or biking.  Work with your health care provider or diet and nutrition specialist (dietitian) to adjust your eating plan to your individual calorie needs. Reading food labels  Check food labels for the amount of sodium per serving. Choose foods with less than 5 percent of the Daily Value of sodium. Generally, foods with less than 300 mg of sodium per serving fit into this eating plan.  To find whole grains, look for the word "whole" as the first word in the ingredient list. Shopping  Buy products labeled as "low-sodium" or "no salt added."  Buy fresh foods. Avoid canned foods and premade or frozen meals. Cooking  Avoid adding salt when cooking. Use salt-free seasonings or herbs instead of table salt or sea salt. Check with your health care provider or pharmacist before using salt substitutes.  Do not fry foods. Cook foods using healthy methods such as baking, boiling, grilling, and broiling instead.  Cook with  heart-healthy oils, such as olive, canola, soybean, or sunflower oil. Meal planning   Eat a balanced diet that includes: ? 5 or more servings of fruits and vegetables each day. At each meal, try to fill half of your plate with fruits and vegetables. ? Up to 6-8 servings of whole grains each day. ? Less than 6 oz of lean meat, poultry, or fish each day. A 3-oz serving of meat is about the same size as a deck of cards. One egg equals 1 oz. ? 2 servings of low-fat dairy each day. ? A serving of nuts, seeds, or beans 5 times each week. ? Heart-healthy fats. Healthy fats called Omega-3 fatty acids are found in foods such as flaxseeds and coldwater fish, like sardines, salmon, and mackerel.  Limit how much you eat of the following: ? Canned or prepackaged foods. ? Food that is high in trans fat, such as fried foods. ? Food that is high in saturated fat, such as fatty meat. ? Sweets, desserts, sugary drinks, and other foods with added sugar. ? Full-fat dairy products.  Do not salt foods before eating.  Try to eat at least 2 vegetarian meals each week.  Eat more home-cooked food and less restaurant, buffet, and fast food.  When eating at a restaurant, ask that your food be prepared with less salt or no salt, if possible. What foods are recommended? The items listed may not be a complete list. Talk with your dietitian about what   dietary choices are best for you. Grains Whole-grain or whole-wheat bread. Whole-grain or whole-wheat pasta. Brown rice. Oatmeal. Quinoa. Bulgur. Whole-grain and low-sodium cereals. Pita bread. Low-fat, low-sodium crackers. Whole-wheat flour tortillas. Vegetables Fresh or frozen vegetables (raw, steamed, roasted, or grilled). Low-sodium or reduced-sodium tomato and vegetable juice. Low-sodium or reduced-sodium tomato sauce and tomato paste. Low-sodium or reduced-sodium canned vegetables. Fruits All fresh, dried, or frozen fruit. Canned fruit in natural juice (without  added sugar). Meat and other protein foods Skinless chicken or turkey. Ground chicken or turkey. Pork with fat trimmed off. Fish and seafood. Egg whites. Dried beans, peas, or lentils. Unsalted nuts, nut butters, and seeds. Unsalted canned beans. Lean cuts of beef with fat trimmed off. Low-sodium, lean deli meat. Dairy Low-fat (1%) or fat-free (skim) milk. Fat-free, low-fat, or reduced-fat cheeses. Nonfat, low-sodium ricotta or cottage cheese. Low-fat or nonfat yogurt. Low-fat, low-sodium cheese. Fats and oils Soft margarine without trans fats. Vegetable oil. Low-fat, reduced-fat, or light mayonnaise and salad dressings (reduced-sodium). Canola, safflower, olive, soybean, and sunflower oils. Avocado. Seasoning and other foods Herbs. Spices. Seasoning mixes without salt. Unsalted popcorn and pretzels. Fat-free sweets. What foods are not recommended? The items listed may not be a complete list. Talk with your dietitian about what dietary choices are best for you. Grains Baked goods made with fat, such as croissants, muffins, or some breads. Dry pasta or rice meal packs. Vegetables Creamed or fried vegetables. Vegetables in a cheese sauce. Regular canned vegetables (not low-sodium or reduced-sodium). Regular canned tomato sauce and paste (not low-sodium or reduced-sodium). Regular tomato and vegetable juice (not low-sodium or reduced-sodium). Pickles. Olives. Fruits Canned fruit in a light or heavy syrup. Fried fruit. Fruit in cream or butter sauce. Meat and other protein foods Fatty cuts of meat. Ribs. Fried meat. Bacon. Sausage. Bologna and other processed lunch meats. Salami. Fatback. Hotdogs. Bratwurst. Salted nuts and seeds. Canned beans with added salt. Canned or smoked fish. Whole eggs or egg yolks. Chicken or turkey with skin. Dairy Whole or 2% milk, cream, and half-and-half. Whole or full-fat cream cheese. Whole-fat or sweetened yogurt. Full-fat cheese. Nondairy creamers. Whipped toppings.  Processed cheese and cheese spreads. Fats and oils Butter. Stick margarine. Lard. Shortening. Ghee. Bacon fat. Tropical oils, such as coconut, palm kernel, or palm oil. Seasoning and other foods Salted popcorn and pretzels. Onion salt, garlic salt, seasoned salt, table salt, and sea salt. Worcestershire sauce. Tartar sauce. Barbecue sauce. Teriyaki sauce. Soy sauce, including reduced-sodium. Steak sauce. Canned and packaged gravies. Fish sauce. Oyster sauce. Cocktail sauce. Horseradish that you find on the shelf. Ketchup. Mustard. Meat flavorings and tenderizers. Bouillon cubes. Hot sauce and Tabasco sauce. Premade or packaged marinades. Premade or packaged taco seasonings. Relishes. Regular salad dressings. Where to find more information:  National Heart, Lung, and Blood Institute: www.nhlbi.nih.gov  American Heart Association: www.heart.org Summary  The DASH eating plan is a healthy eating plan that has been shown to reduce high blood pressure (hypertension). It may also reduce your risk for type 2 diabetes, heart disease, and stroke.  With the DASH eating plan, you should limit salt (sodium) intake to 2,300 mg a day. If you have hypertension, you may need to reduce your sodium intake to 1,500 mg a day.  When on the DASH eating plan, aim to eat more fresh fruits and vegetables, whole grains, lean proteins, low-fat dairy, and heart-healthy fats.  Work with your health care provider or diet and nutrition specialist (dietitian) to adjust your eating plan to your individual   calorie needs. This information is not intended to replace advice given to you by your health care provider. Make sure you discuss any questions you have with your health care provider. Document Released: 09/26/2011 Document Revised: 09/30/2016 Document Reviewed: 09/30/2016 Elsevier Interactive Patient Education  2018 Elsevier Inc.  

## 2018-02-06 ENCOUNTER — Encounter: Payer: 59 | Admitting: Family Medicine

## 2018-04-13 ENCOUNTER — Ambulatory Visit (INDEPENDENT_AMBULATORY_CARE_PROVIDER_SITE_OTHER): Payer: Managed Care, Other (non HMO) | Admitting: Family Medicine

## 2018-04-13 ENCOUNTER — Encounter: Payer: Self-pay | Admitting: Family Medicine

## 2018-04-13 VITALS — BP 118/76 | HR 84 | Temp 98.6°F | Resp 16 | Ht 64.0 in | Wt 238.0 lb

## 2018-04-13 DIAGNOSIS — Z0000 Encounter for general adult medical examination without abnormal findings: Secondary | ICD-10-CM

## 2018-04-13 DIAGNOSIS — G8929 Other chronic pain: Secondary | ICD-10-CM

## 2018-04-13 DIAGNOSIS — M545 Low back pain: Secondary | ICD-10-CM

## 2018-04-13 DIAGNOSIS — Z1239 Encounter for other screening for malignant neoplasm of breast: Secondary | ICD-10-CM

## 2018-04-13 DIAGNOSIS — Z23 Encounter for immunization: Secondary | ICD-10-CM

## 2018-04-13 DIAGNOSIS — Z1231 Encounter for screening mammogram for malignant neoplasm of breast: Secondary | ICD-10-CM

## 2018-04-13 DIAGNOSIS — E119 Type 2 diabetes mellitus without complications: Secondary | ICD-10-CM | POA: Diagnosis not present

## 2018-04-13 DIAGNOSIS — N951 Menopausal and female climacteric states: Secondary | ICD-10-CM | POA: Diagnosis not present

## 2018-04-13 NOTE — Assessment & Plan Note (Signed)
Previously well controlled Diet controlled, on no medications Foot exam completed today We will request records from eye exam We will update tetanus vaccination Up-to-date on pneumococcal vaccination Check A1c Check microalbumin If A1c greater than 7, will need to consider other medications Follow-up in 6 months

## 2018-04-13 NOTE — Assessment & Plan Note (Signed)
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

## 2018-04-13 NOTE — Patient Instructions (Signed)
Preventive Care 40-64 Years, Female Preventive care refers to lifestyle choices and visits with your health care provider that can promote health and wellness. What does preventive care include?  A yearly physical exam. This is also called an annual well check.  Dental exams once or twice a year.  Routine eye exams. Ask your health care provider how often you should have your eyes checked.  Personal lifestyle choices, including: ? Daily care of your teeth and gums. ? Regular physical activity. ? Eating a healthy diet. ? Avoiding tobacco and drug use. ? Limiting alcohol use. ? Practicing safe sex. ? Taking low-dose aspirin daily starting at age 58. ? Taking vitamin and mineral supplements as recommended by your health care provider. What happens during an annual well check? The services and screenings done by your health care provider during your annual well check will depend on your age, overall health, lifestyle risk factors, and family history of disease. Counseling Your health care provider may ask you questions about your:  Alcohol use.  Tobacco use.  Drug use.  Emotional well-being.  Home and relationship well-being.  Sexual activity.  Eating habits.  Work and work Statistician.  Method of birth control.  Menstrual cycle.  Pregnancy history.  Screening You may have the following tests or measurements:  Height, weight, and BMI.  Blood pressure.  Lipid and cholesterol levels. These may be checked every 5 years, or more frequently if you are over 81 years old.  Skin check.  Lung cancer screening. You may have this screening every year starting at age 78 if you have a 30-pack-year history of smoking and currently smoke or have quit within the past 15 years.  Fecal occult blood test (FOBT) of the stool. You may have this test every year starting at age 65.  Flexible sigmoidoscopy or colonoscopy. You may have a sigmoidoscopy every 5 years or a colonoscopy  every 10 years starting at age 30.  Hepatitis C blood test.  Hepatitis B blood test.  Sexually transmitted disease (STD) testing.  Diabetes screening. This is done by checking your blood sugar (glucose) after you have not eaten for a while (fasting). You may have this done every 1-3 years.  Mammogram. This may be done every 1-2 years. Talk to your health care provider about when you should start having regular mammograms. This may depend on whether you have a family history of breast cancer.  BRCA-related cancer screening. This may be done if you have a family history of breast, ovarian, tubal, or peritoneal cancers.  Pelvic exam and Pap test. This may be done every 3 years starting at age 80. Starting at age 36, this may be done every 5 years if you have a Pap test in combination with an HPV test.  Bone density scan. This is done to screen for osteoporosis. You may have this scan if you are at high risk for osteoporosis.  Discuss your test results, treatment options, and if necessary, the need for more tests with your health care provider. Vaccines Your health care provider may recommend certain vaccines, such as:  Influenza vaccine. This is recommended every year.  Tetanus, diphtheria, and acellular pertussis (Tdap, Td) vaccine. You may need a Td booster every 10 years.  Varicella vaccine. You may need this if you have not been vaccinated.  Zoster vaccine. You may need this after age 5.  Measles, mumps, and rubella (MMR) vaccine. You may need at least one dose of MMR if you were born in  1958 or later. You may also need a second dose.  Pneumococcal 13-valent conjugate (PCV13) vaccine. You may need this if you have certain conditions and were not previously vaccinated.  Pneumococcal polysaccharide (PPSV23) vaccine. You may need one or two doses if you smoke cigarettes or if you have certain conditions.  Meningococcal vaccine. You may need this if you have certain  conditions.  Hepatitis A vaccine. You may need this if you have certain conditions or if you travel or work in places where you may be exposed to hepatitis A.  Hepatitis B vaccine. You may need this if you have certain conditions or if you travel or work in places where you may be exposed to hepatitis B.  Haemophilus influenzae type b (Hib) vaccine. You may need this if you have certain conditions.  Talk to your health care provider about which screenings and vaccines you need and how often you need them. This information is not intended to replace advice given to you by your health care provider. Make sure you discuss any questions you have with your health care provider. Document Released: 11/03/2015 Document Revised: 06/26/2016 Document Reviewed: 08/08/2015 Elsevier Interactive Patient Education  2018 Elsevier Inc.  

## 2018-04-13 NOTE — Progress Notes (Signed)
Patient: Nancy Blake Bold, Female    DOB: 1966-09-10, 52 y.o.   MRN: [id number] Visit Date: 04/13/2018  Today's Provider: Shirlee LatchAngela Bacigalupo, MD   Chief Complaint  Patient presents with  . Annual Exam   Subjective:    Annual physical exam Nancy Blake Sessa is a 52 y.o. female who presents today for health maintenance and complete physical. She feels well.   She reports exercising some. She reports she is sleeping well.  Pap smear - 11/09/2015 - NIL, HPV negative Mammogram - 03/04/17 - BI-RADs 1 Colonoscopy - 03/19/16 - Hyperplastic rectal polyp - repeat in 10 yrs ----------------------------------------------------------------- Pt is also having trouble with lower back pain.  Pt would like a note for a new chair for work.  Back pain is bilateral in her lower back.  It occasionally radiates into her left buttock.  It does not radiate down either leg.  She denies any weakness, numbness in her extremities, saddle anesthesia, urinary incontinence, fevers.  She finds that it hurts worse at the end of the workday due to her uncomfortable chair at work.  When she stands from sitting for long periods of time it is stiff and painful.  She is trying to walk more at work to break up her time sitting.  She feels a chair with lumbar support may be more helpful at work.  Patient reports hot flashes with intermittent episodes of flushing and sweating.  She finds that she is more heat intolerant and sits with a fan on at work.  No one else seems to be uncomfortable at work.  She is status post hysterectomy and therefore does not have periods.  She wonders if she may be menopausal now.  She does not want to take any hormone replacement.  She wonders if there are any natural alternatives for her symptoms.  T2DM - Checking BG at home: yes, a few times monthly, 109-125 fasting - Medications: none - felt unwell with BG 90s on Metformin and stopped it a few years ago - eye exam: completed within last  year - will request - foot exam: needs - microalbumin: needs - denies symptoms of hypoglycemia, polyuria, polydipsia, numbness extremities, foot ulcers/trauma   Review of Systems  Constitutional: Negative.   HENT: Positive for postnasal drip, sinus pressure and tinnitus. Negative for congestion, dental problem, drooling, ear discharge, ear pain, facial swelling, hearing loss, mouth sores, nosebleeds, rhinorrhea, sinus pain, sneezing, sore throat, trouble swallowing and voice change.   Eyes: Negative.   Respiratory: Negative.   Cardiovascular: Negative.   Gastrointestinal: Negative.   Endocrine: Negative.   Genitourinary: Negative.   Musculoskeletal: Positive for back pain. Negative for arthralgias, gait problem, joint swelling, myalgias, neck pain and neck stiffness.  Skin: Negative.   Allergic/Immunologic: Positive for environmental allergies. Negative for food allergies and immunocompromised state.  Neurological: Negative.   Hematological: Negative.   Psychiatric/Behavioral: Negative.     Social History      She  reports that she quit smoking about 34 years ago. She has never used smokeless tobacco. She reports that she does not drink alcohol or use drugs.       Social History   Socioeconomic History  . Marital status: Married    Spouse name: Not on file  . Number of children: Not on file  . Years of education: Not on file  . Highest education level: Not on file  Occupational History  . Not on file  Social Needs  . Financial  resource strain: Not on file  . Food insecurity:    Worry: Not on file    Inability: Not on file  . Transportation needs:    Medical: Not on file    Non-medical: Not on file  Tobacco Use  . Smoking status: Former Smoker    Last attempt to quit: 10/21/1983    Years since quitting: 34.5  . Smokeless tobacco: Never Used  Substance and Sexual Activity  . Alcohol use: No  . Drug use: No  . Sexual activity: Not on file  Lifestyle  . Physical  activity:    Days per week: Not on file    Minutes per session: Not on file  . Stress: Not on file  Relationships  . Social connections:    Talks on phone: Not on file    Gets together: Not on file    Attends religious service: Not on file    Active member of club or organization: Not on file    Attends meetings of clubs or organizations: Not on file    Relationship status: Not on file  Other Topics Concern  . Not on file  Social History Narrative  . Not on file    Past Medical History:  Diagnosis Date  . Allergic rhinitis   . Asthma   . Diabetes mellitus without complication (HCC)   . Pneumonia      Patient Active Problem List   Diagnosis Date Noted  . Menopausal symptoms 04/13/2018  . Chronic bilateral low back pain without sciatica 04/13/2018  . Asthma exacerbation 04/16/2016  . Rectal polyp   . Diabetes (HCC) 12/08/2015  . Reactive airway disease with acute exacerbation 10/14/2015  . Allergic rhinitis 04/01/2009  . Acid reflux 03/31/2009    Past Surgical History:  Procedure Laterality Date  . ABDOMINAL HYSTERECTOMY    . APPENDECTOMY    . COLONOSCOPY WITH PROPOFOL N/A 03/19/2016   Procedure: COLONOSCOPY WITH PROPOFOL;  Surgeon: Midge Minium, MD;  Location: [facility] ENDOSCOPY;  Service: Endoscopy;  Laterality: N/A;  . TUBAL LIGATION  1995    Family History        Family Status  Relation Name Status  . Mother  Alive  . Father  Alive  . Brother  Alive  . MGM  Deceased  . MGF  Deceased  . PGF  Deceased  . Neg Hx  (Not Specified)        Her family history includes CAD in her mother; COPD in her maternal grandfather; Dementia in her mother; Healthy in her brother and father; Lung cancer in her paternal grandfather; Osteoporosis in her maternal grandmother. There is no history of Breast cancer.      No Known Allergies   Current Outpatient Medications:  .  acetaminophen (TYLENOL) 500 MG tablet, Take 500 mg by mouth every 6 (six) hours as needed., Disp: , Rfl:    .  albuterol (PROVENTIL HFA;VENTOLIN HFA) 108 (90 Base) MCG/ACT inhaler, Inhale 2 puffs into the lungs every 6 (six) hours as needed for wheezing or shortness of breath., Disp: 1 Inhaler, Rfl: 3 .  benzonatate (TESSALON) 200 MG capsule, Take 1 capsule (200 mg total) by mouth 3 (three) times daily as needed for cough., Disp: 30 capsule, Rfl: 1 .  cetirizine (ZYRTEC) 10 MG tablet, Take 1 tablet (10 mg total) by mouth daily., Disp: 90 tablet, Rfl: 3 .  fluticasone furoate-vilanterol (BREO ELLIPTA) 200-25 MCG/INH AEPB, Inhale 1 puff into the lungs daily., Disp: 180 each, Rfl: 2 .  glucose blood test strip, Use as instructed, Disp: 100 each, Rfl: 12 .  ibuprofen (ADVIL,MOTRIN) 200 MG tablet, Take 600 mg by mouth every 6 (six) hours as needed for mild pain or moderate pain., Disp: , Rfl:  .  ipratropium-albuterol (DUONEB) 0.5-2.5 (3) MG/3ML SOLN, Take 3 mLs by nebulization every 4 (four) hours as needed., Disp: 360 mL, Rfl: 1 .  lansoprazole (PREVACID) 15 MG capsule, Take 15 mg by mouth daily at 12 noon., Disp: , Rfl:  .  Multiple Vitamin (MULTIVITAMIN) tablet, Take 1 tablet by mouth daily., Disp: , Rfl:  .  fluticasone (FLONASE) 50 MCG/ACT nasal spray, Place 2 sprays into both nostrils daily., Disp: 16 g, Rfl: 8   Patient Care Team: Erasmo Downer, MD as PCP - General (Family Medicine)      Objective:   Vitals: BP 118/76 (BP Location: Right Arm, Patient Position: Sitting, Cuff Size: Large)   Pulse 84   Temp 98.6 F (37 C) (Oral)   Resp 16   Ht 5\' 4"  (1.626 m)   Wt 238 lb (108 kg)   BMI 40.85 kg/m    Vitals:   04/13/18 0913  BP: 118/76  Pulse: 84  Resp: 16  Temp: 98.6 F (37 C)  TempSrc: Oral  Weight: 238 lb (108 kg)  Height: 5\' 4"  (1.626 m)     Physical Exam  Constitutional: She is oriented to person, place, and time. She appears well-developed and well-nourished. No distress.  HENT:  Head: Normocephalic and atraumatic.  Right Ear: External ear normal.  Left Ear:  External ear normal.  Nose: Nose normal.  Mouth/Throat: Oropharynx is clear and moist.  Eyes: Pupils are equal, round, and reactive to light. Conjunctivae and EOM are normal. No scleral icterus.  Neck: Neck supple. No thyromegaly present.  Cardiovascular: Normal rate, regular rhythm, normal heart sounds and intact distal pulses.  No murmur heard. Pulmonary/Chest: Effort normal and breath sounds normal. No respiratory distress. She has no wheezes. She has no rales.  Abdominal: Soft. Bowel sounds are normal. She exhibits no distension. There is no tenderness. There is no rebound and no guarding.  Genitourinary:  Genitourinary Comments: Breasts: breasts appear normal, no suspicious masses, no skin or nipple changes or axillary nodes.   Musculoskeletal: She exhibits no edema or deformity.  Back: Normal range of motion.  No tenderness to palpation.  Negative straight leg raise bilaterally.  Strength and sensation to light touch intact in lower extremities.  Lymphadenopathy:    She has no cervical adenopathy.  Neurological: She is alert and oriented to person, place, and time.  Skin: Skin is warm and dry. Capillary refill takes less than 2 seconds. No rash noted.  Psychiatric: She has a normal mood and affect. Her behavior is normal.  Vitals reviewed.   Diabetic Foot Exam - Simple   Simple Foot Form Diabetic Foot exam was performed with the following findings:  Yes 04/13/2018  9:35 AM  Visual Inspection No deformities, no ulcerations, no other skin breakdown bilaterally:  Yes Sensation Testing Intact to touch and monofilament testing bilaterally:  Yes Pulse Check Posterior Tibialis and Dorsalis pulse intact bilaterally:  Yes Comments      Depression Screen PHQ 2/9 Scores 04/13/2018 02/04/2017 11/09/2015  PHQ - 2 Score 0 0 0  PHQ- 9 Score 1 0 -      Assessment & Plan:     Routine Health Maintenance and Physical Exam  Exercise Activities and Dietary recommendations Goals    None  Immunization History  Administered Date(s) Administered  . Influenza Split 08/22/2015, 09/10/2017  . Pneumococcal Polysaccharide-23 12/08/2015    Health Maintenance  Topic Date Due  . OPHTHALMOLOGY EXAM  04/01/1975  . TETANUS/TDAP  03/31/1984  . HEMOGLOBIN A1C  08/14/2017  . FOOT EXAM  02/04/2018  . URINE MICROALBUMIN  02/04/2018  . HIV Screening  04/14/2019 (Originally 03/31/1980)  . INFLUENZA VACCINE  05/21/2018  . PAP SMEAR  11/08/2018  . MAMMOGRAM  03/05/2019  . PNEUMOCOCCAL POLYSACCHARIDE VACCINE (2) 12/07/2020  . COLONOSCOPY  03/19/2026     Discussed health benefits of physical activity, and encouraged her to engage in regular exercise appropriate for her age and condition.    --------------------------------------------------------------------  Problem List Items Addressed This Visit      Endocrine   Diabetes [facility])    Previously well controlled Diet controlled, on no medications Foot exam completed today We will request records from eye exam We will update tetanus vaccination Up-to-date on pneumococcal vaccination Check A1c Check microalbumin If A1c greater than 7, will need to consider other medications Follow-up in 6 months      Relevant Orders   Hemoglobin A1c     Other   Menopausal symptoms    Symptoms are consistent with vasomotor symptoms of menopause We will check FSH and LH to see if patient is menopausal Check TSH to ensure this is not causing heat intolerance Try black cohosh Could consider SSRI or gabapentin for symptoms in the future      Relevant Orders   TSH   FSH/LH   Chronic bilateral low back pain without sciatica    Chronic and stable, but new problem to me Benign exam and no red flag symptoms Suspect this is musculoskeletal and related to sitting all day Discussed importance of getting up and walking around Note given for work to provide lumbar support for her chair Return precautions discussed       Other Visit  Diagnoses    Encounter for annual physical exam    -  Primary   Relevant Orders   Comprehensive metabolic panel   CBC   Lipid panel   Measles/Mumps/Rubella Immunity   Screening for breast cancer       Relevant Orders   MS DIGITAL SCREENING TOMO BILATERAL       Return in about 6 months (around 10/13/2018) for chronic disease f/u.   The entirety of the information documented in the History of Present Illness, Review of Systems and Physical Exam were personally obtained by me. Portions of this information were initially documented by Kavin Leech, CMA and reviewed by me for thoroughness and accuracy.    Erasmo Downer, MD, MPH [facility] 04/13/2018 10:10 AM

## 2018-04-13 NOTE — Assessment & Plan Note (Signed)
Symptoms are consistent with vasomotor symptoms of menopause We will check FSH and LH to see if patient is menopausal Check TSH to ensure this is not causing heat intolerance Try black cohosh Could consider SSRI or gabapentin for symptoms in the future

## 2018-04-14 NOTE — Addendum Note (Signed)
Addended by: Kavin LeechWALSH, Briann Sarchet E on: 04/14/2018 08:29 AM   Modules accepted: Orders

## 2018-04-19 LAB — LIPID PANEL
CHOL/HDL RATIO: 3.2 ratio (ref 0.0–4.4)
Cholesterol, Total: 153 mg/dL (ref 100–199)
HDL: 48 mg/dL (ref >39)
LDL Calculated: 87 mg/dL (ref 0–99)
TRIGLYCERIDES: 92 mg/dL (ref 0–149)
VLDL Cholesterol Cal: 18 mg/dL (ref 5–40)

## 2018-04-19 LAB — COMPREHENSIVE METABOLIC PANEL
ALK PHOS: 87 IU/L (ref 39–117)
ALT: 20 IU/L (ref 0–32)
AST: 16 IU/L (ref 0–40)
Albumin/Globulin Ratio: 1.9 (ref 1.2–2.2)
Albumin: 3.9 g/dL (ref 3.5–5.5)
BUN/Creatinine Ratio: 12 (ref 9–23)
BUN: 10 mg/dL (ref 6–24)
Bilirubin Total: 0.3 mg/dL (ref 0.0–1.2)
CALCIUM: 9.1 mg/dL (ref 8.7–10.2)
CO2: 19 mmol/L — ABNORMAL LOW (ref 20–29)
Chloride: 107 mmol/L — ABNORMAL HIGH (ref 96–106)
Creatinine, Ser: 0.81 mg/dL (ref 0.57–1.00)
GFR calc Af Amer: 96 mL/min/{1.73_m2} (ref >59)
GFR, EST NON AFRICAN AMERICAN: 83 mL/min/{1.73_m2} (ref >59)
GLOBULIN, TOTAL: 2.1 g/dL (ref 1.5–4.5)
GLUCOSE: 128 mg/dL — AB (ref 65–99)
Potassium: 4.3 mmol/L (ref 3.5–5.2)
SODIUM: 142 mmol/L (ref 134–144)
Total Protein: 6 g/dL (ref 6.0–8.5)

## 2018-04-19 LAB — FSH/LH
FSH: 71.5 m[IU]/mL
LH: 43.4 m[IU]/mL

## 2018-04-19 LAB — MEASLES/MUMPS/RUBELLA IMMUNITY
MUMPS ABS, IGG: 11.1 AU/mL (ref >10.9)
Rubella Antibodies, IGG: 2.96 index (ref >0.99)

## 2018-04-19 LAB — TSH: TSH: 1.94 u[IU]/mL (ref 0.450–4.500)

## 2018-04-19 LAB — HEMOGLOBIN A1C
Est. average glucose Bld gHb Est-mCnc: 140 mg/dL
Hgb A1c MFr Bld: 6.5 % — ABNORMAL HIGH (ref 4.8–5.6)

## 2018-04-19 LAB — CBC
Hematocrit: 41.3 % (ref 34.0–46.6)
Hemoglobin: 14 g/dL (ref 11.1–15.9)
MCH: 27.6 pg (ref 26.6–33.0)
MCHC: 33.9 g/dL (ref 31.5–35.7)
MCV: 81 fL (ref 79–97)
Platelets: 262 10*3/uL (ref 150–450)
RBC: 5.08 x10E6/uL (ref 3.77–5.28)
RDW: 14.5 % (ref 12.3–15.4)
WBC: 7.2 10*3/uL (ref 3.4–10.8)

## 2018-04-20 ENCOUNTER — Telehealth: Payer: Self-pay | Admitting: Family Medicine

## 2018-04-20 NOTE — Telephone Encounter (Signed)
Pt was in last and seen Dr. B   Dr. B wrote a note for work to provide her with a chair with lumbar support.  She can not find the note and wants to know if she can get a copy put up front for her to pick up.  Please call and let her know when it is ready..  [phone]  Thanks Barth Kirksteri

## 2018-04-24 NOTE — Telephone Encounter (Signed)
Reprinted and placed at reception for pick up. Left message advising pt.

## 2018-04-28 ENCOUNTER — Telehealth: Payer: Self-pay

## 2018-04-28 NOTE — Telephone Encounter (Signed)
LMTCB. Will fax report.

## 2018-04-28 NOTE — Telephone Encounter (Signed)
-----   Message from Erasmo DownerAngela M Bacigalupo, MD sent at 04/27/2018  9:44 AM EDT ----- Normal kidney function, electrolytes, liver function, blood counts, thyroid function.  Cholesterol is well controlled.  10 year risk of heart disease/stroke is low at 2.3%.  Hemoglobin A1c, 2942-month average of blood sugars, has increased from 6.1 to 6.5.  Recommend low-carb diet and regular exercise.  We should recheck this in 3 to 6 months to ensure it is not continuing to increase.  Immune to measles, mumps, rubella.  FSH and LH appear to be in postmenopausal range.  [facility] form completed.  Please fax with lab report.  Erasmo DownerBacigalupo, Angela M, MD, MPH [facility] 04/27/2018 9:44 AM

## 2018-05-06 NOTE — Telephone Encounter (Signed)
Pt advised. Will FU on A1C at scheduled OV in January.

## 2018-05-19 ENCOUNTER — Ambulatory Visit: Payer: Managed Care, Other (non HMO) | Admitting: Family Medicine

## 2018-05-19 ENCOUNTER — Encounter: Payer: Self-pay | Admitting: Family Medicine

## 2018-05-19 ENCOUNTER — Telehealth: Payer: Self-pay | Admitting: Internal Medicine

## 2018-05-19 VITALS — BP 112/80 | HR 93 | Temp 98.3°F | Resp 16 | Wt 237.0 lb

## 2018-05-19 DIAGNOSIS — J4541 Moderate persistent asthma with (acute) exacerbation: Secondary | ICD-10-CM | POA: Diagnosis not present

## 2018-05-19 MED ORDER — PREDNISONE 20 MG PO TABS: 40.0000 mg | ORAL_TABLET | Freq: Every day | ORAL | 0 refills | Status: AC

## 2018-05-19 MED ORDER — LEVOFLOXACIN 500 MG PO TABS: 500.0000 mg | ORAL_TABLET | Freq: Every day | ORAL | 0 refills | Status: DC

## 2018-05-19 NOTE — Progress Notes (Signed)
Patient: Nancy Blake Female    DOB: [birth year]   52 y.o.   MRN: [id number] Visit Date: 05/19/2018  Today's Provider: Shirlee LatchAngela Princetta Uplinger, MD   I, Joslyn HyEmily Ratchford, CMA, am acting as scribe for Shirlee LatchAngela Zayyan Mullen, MD.  Chief Complaint  Patient presents with  . URI   Subjective:    URI   This is a new problem. Episode onset: x 3 days. The problem has been gradually worsening. Maximum temperature: undocumented temperature, but has noticed chills and sweats. Associated symptoms include chest pain (tightness), congestion, coughing (slightly productive), ear pain, headaches, a plugged ear sensation, sneezing, a sore throat, swollen glands and wheezing. Pertinent negatives include no abdominal pain, diarrhea, dysuria, nausea, neck pain, rhinorrhea, sinus pain or vomiting. Treatments tried: Tylenol Severe Sinus. The treatment provided no relief.  Pt's PMH includes asthma. States sx are similar to previous bronchitis. Pt states she has also tired nebulaizer solution, with relief. Pt is also c/o fatigue/weakness.  She feels like this is an asthma flare or bronchitis, which she has had many times in the past.  Pulmonology typically prescribes prednisone and levaquin for similar flares in the past.     No Known Allergies   Current Outpatient Medications:  .  acetaminophen (TYLENOL) 500 MG tablet, Take 500 mg by mouth every 6 (six) hours as needed., Disp: , Rfl:  .  albuterol (PROVENTIL HFA;VENTOLIN HFA) 108 (90 Base) MCG/ACT inhaler, Inhale 2 puffs into the lungs every 6 (six) hours as needed for wheezing or shortness of breath., Disp: 1 Inhaler, Rfl: 3 .  benzonatate (TESSALON) 200 MG capsule, Take 1 capsule (200 mg total) by mouth 3 (three) times daily as needed for cough., Disp: 30 capsule, Rfl: 1 .  cetirizine (ZYRTEC) 10 MG tablet, Take 1 tablet (10 mg total) by mouth daily., Disp: 90 tablet, Rfl: 3 .  fluticasone (FLONASE) 50 MCG/ACT nasal spray, Place 2 sprays into both nostrils  daily., Disp: 16 g, Rfl: 8 .  fluticasone furoate-vilanterol (BREO ELLIPTA) 200-25 MCG/INH AEPB, Inhale 1 puff into the lungs daily., Disp: 180 each, Rfl: 2 .  glucose blood test strip, Use as instructed, Disp: 100 each, Rfl: 12 .  ibuprofen (ADVIL,MOTRIN) 200 MG tablet, Take 600 mg by mouth every 6 (six) hours as needed for mild pain or moderate pain., Disp: , Rfl:  .  ipratropium-albuterol (DUONEB) 0.5-2.5 (3) MG/3ML SOLN, Take 3 mLs by nebulization every 4 (four) hours as needed., Disp: 360 mL, Rfl: 1 .  lansoprazole (PREVACID) 15 MG capsule, Take 15 mg by mouth daily at 12 noon., Disp: , Rfl:  .  Multiple Vitamin (MULTIVITAMIN) tablet, Take 1 tablet by mouth daily., Disp: , Rfl:   Review of Systems  Constitutional: Positive for chills, diaphoresis and fatigue.  HENT: Positive for congestion, ear pain, sneezing and sore throat. Negative for rhinorrhea and sinus pain.   Respiratory: Positive for cough (slightly productive) and wheezing.   Cardiovascular: Positive for chest pain (tightness).  Gastrointestinal: Negative for abdominal pain, diarrhea, nausea and vomiting.  Genitourinary: Negative for dysuria.  Musculoskeletal: Negative for neck pain.  Neurological: Positive for headaches.    Social History   Tobacco Use  . Smoking status: Former Smoker    Last attempt to quit: 10/21/1983    Years since quitting: 34.6  . Smokeless tobacco: Never Used  Substance Use Topics  . Alcohol use: No   Objective:   BP 112/80 (BP Location: Left Arm, Patient Position: Sitting, Cuff Size: Large)  Pulse 93   Temp 98.3 F (36.8 C) (Oral)   Resp 16   Wt 237 lb (107.5 kg)   SpO2 99%   BMI 40.68 kg/m  Vitals:   05/19/18 1030  BP: 112/80  Pulse: 93  Resp: 16  Temp: 98.3 F (36.8 C)  TempSrc: Oral  SpO2: 99%  Weight: 237 lb (107.5 kg)     Physical Exam  Constitutional: She is oriented to person, place, and time. She appears well-developed and well-nourished. No distress.  HENT:    Head: Normocephalic and atraumatic.  Right Ear: External ear normal.  Left Ear: External ear normal.  Nose: Nose normal.  Mouth/Throat: Oropharynx is clear and moist. No oropharyngeal exudate.  Eyes: Pupils are equal, round, and reactive to light. Conjunctivae and EOM are normal. Right eye exhibits no discharge. Left eye exhibits no discharge. No scleral icterus.  Neck: Neck supple. No thyromegaly present.  Cardiovascular: Normal rate, regular rhythm, normal heart sounds and intact distal pulses.  No murmur heard. Pulmonary/Chest: No accessory muscle usage. Tachypnea noted. No respiratory distress. She has decreased breath sounds in the right lower field and the left lower field. She has wheezes in the right upper field and the left upper field. She has no rhonchi. She has no rales.  Musculoskeletal: She exhibits no edema.  Lymphadenopathy:    She has no cervical adenopathy.  Neurological: She is alert and oriented to person, place, and time.  Skin: Skin is warm and dry. Capillary refill takes less than 2 seconds. No rash noted.  Psychiatric: She has a normal mood and affect. Her behavior is normal.  Vitals reviewed.       Assessment & Plan:   Problem List Items Addressed This Visit      Respiratory   Asthma exacerbation - Primary    Patient with asthma exacerbation Trigger was likely either viral URI or contact with allergens from dog As she is done well in the past, we will treat again with Levaquin 500 mg daily x7 days, prednisone burst 40 mg daily x5 days Continue DuoNeb nebulizer treatment-schedule every 6 hours for first 2 days and then use as needed Avoid allergens and other triggers Return precautions discussed She has follow-up with her pulmonologist next month also      Relevant Medications   predniSONE (DELTASONE) 20 MG tablet       Return if symptoms worsen or fail to improve.   The entirety of the information documented in the History of Present Illness,  Review of Systems and Physical Exam were personally obtained by me. Portions of this information were initially documented by Irving Burton Ratchford, CMA and reviewed by me for thoroughness and accuracy.    Erasmo Downer, MD, MPH [facility] 05/19/2018 11:05 AM

## 2018-05-19 NOTE — Assessment & Plan Note (Signed)
Patient with asthma exacerbation Trigger was likely either viral URI or contact with allergens from dog As she is done well in the past, we will treat again with Levaquin 500 mg daily x7 days, prednisone burst 40 mg daily x5 days Continue DuoNeb nebulizer treatment-schedule every 6 hours for first 2 days and then use as needed Avoid allergens and other triggers Return precautions discussed She has follow-up with her pulmonologist next month also

## 2018-05-19 NOTE — Patient Instructions (Signed)
Asthma, Acute Bronchospasm °Acute bronchospasm caused by asthma is also referred to as an asthma attack. Bronchospasm means your air passages become narrowed. The narrowing is caused by inflammation and tightening of the muscles in the air tubes (bronchi) in your lungs. This can make it hard to breathe or cause you to wheeze and cough. °What are the causes? °Possible triggers are: °· Animal dander from the skin, hair, or feathers of animals. °· Dust mites contained in house dust. °· Cockroaches. °· Pollen from trees or grass. °· Mold. °· Cigarette or tobacco smoke. °· Air pollutants such as dust, household cleaners, hair sprays, aerosol sprays, paint fumes, strong chemicals, or strong odors. °· Cold air or weather changes. Cold air may trigger inflammation. Winds increase molds and pollens in the air. °· Strong emotions such as crying or laughing hard. °· Stress. °· Certain medicines such as aspirin or beta-blockers. °· Sulfites in foods and drinks, such as dried fruits and wine. °· Infections or inflammatory conditions, such as a flu, cold, or inflammation of the nasal membranes (rhinitis). °· Gastroesophageal reflux disease (GERD). GERD is a condition where stomach acid backs up into your esophagus. °· Exercise or strenuous activity. ° °What are the signs or symptoms? °· Wheezing. °· Excessive coughing, particularly at night. °· Chest tightness. °· Shortness of breath. °How is this diagnosed? °Your health care provider will ask you about your medical history and perform a physical exam. A chest X-ray or blood testing may be performed to look for other causes of your symptoms or other conditions that may have triggered your asthma attack. °How is this treated? °Treatment is aimed at reducing inflammation and opening up the airways in your lungs. Most asthma attacks are treated with inhaled medicines. These include quick relief or rescue medicines (such as bronchodilators) and controller medicines (such as inhaled  corticosteroids). These medicines are sometimes given through an inhaler or a nebulizer. Systemic steroid medicine taken by mouth or given through an IV tube also can be used to reduce the inflammation when an attack is moderate or severe. Antibiotic medicines are only used if a bacterial infection is present. °Follow these instructions at home: °· Rest. °· Drink plenty of liquids. This helps the mucus to remain thin and be easily coughed up. Only use caffeine in moderation and do not use alcohol until you have recovered from your illness. °· Do not smoke. Avoid being exposed to secondhand smoke. °· You play a critical role in keeping yourself in good health. Avoid exposure to things that cause you to wheeze or to have breathing problems. °· Keep your medicines up-to-date and available. Carefully follow your health care provider’s treatment plan. °· Take your medicine exactly as prescribed. °· When pollen or pollution is bad, keep windows closed and use an air conditioner or go to places with air conditioning. °· Asthma requires careful medical care. See your health care provider for a follow-up as advised. If you are more than [redacted] weeks pregnant and you were prescribed any new medicines, let your obstetrician know about the visit and how you are doing. Follow up with your health care provider as directed. °· After you have recovered from your asthma attack, make an appointment with your outpatient doctor to talk about ways to reduce the likelihood of future attacks. If you do not have a doctor who manages your asthma, make an appointment with a primary care doctor to discuss your asthma. °Get help right away if: °· You are getting worse. °·   You have trouble breathing. If severe, call your local emergency services (911 in the [place] °· You develop chest pain or discomfort. °· You are vomiting. °· You are not able to keep fluids down. °· You are coughing up yellow, green, brown, or bloody sputum. °· You have a fever  and your symptoms suddenly get worse. °· You have trouble swallowing. °This information is not intended to replace advice given to you by your health care provider. Make sure you discuss any questions you have with your health care provider. °Document Released: 01/22/2007 Document Revised: 03/20/2016 Document Reviewed: 04/14/2013 °Elsevier Interactive Patient Education © 2017 [employer] ° °

## 2018-05-19 NOTE — Telephone Encounter (Signed)
Patient calling  States that she has been sick with either an upper respiratory infection or bronchitis Would like a prescription called in Please call to discuss

## 2018-05-19 NOTE — Telephone Encounter (Signed)
Returned call to patient she states she has been sick since Saturday. Thinks she has bronchitis. Made patient aware appt needed but [facility]'s 1st available is Thursday. Pt will call her pcp to see if she can be seen sooner, if not she will call back and schedule appt. Nothing further needed.

## 2018-05-25 ENCOUNTER — Ambulatory Visit
Admission: RE | Admit: 2018-05-25 | Discharge: 2018-05-25 | Disposition: A | Discharge status: Home / Self Care | Payer: Managed Care, Other (non HMO) | Source: Ambulatory Visit | Attending: Family Medicine | Admitting: Family Medicine

## 2018-05-25 ENCOUNTER — Encounter: Payer: Self-pay | Admitting: Family Medicine

## 2018-05-25 ENCOUNTER — Ambulatory Visit: Payer: Managed Care, Other (non HMO) | Admitting: Family Medicine

## 2018-05-25 ENCOUNTER — Telehealth: Payer: Self-pay | Admitting: Internal Medicine

## 2018-05-25 ENCOUNTER — Telehealth: Payer: Self-pay

## 2018-05-25 VITALS — BP 112/82 | HR 80 | Temp 97.8°F | Resp 16 | Wt 238.0 lb

## 2018-05-25 DIAGNOSIS — R05 Cough: Secondary | ICD-10-CM | POA: Insufficient documentation

## 2018-05-25 DIAGNOSIS — J4541 Moderate persistent asthma with (acute) exacerbation: Secondary | ICD-10-CM

## 2018-05-25 DIAGNOSIS — J301 Allergic rhinitis due to pollen: Secondary | ICD-10-CM | POA: Diagnosis not present

## 2018-05-25 DIAGNOSIS — R0602 Shortness of breath: Secondary | ICD-10-CM | POA: Diagnosis present

## 2018-05-25 MED ORDER — MOMETASONE FUROATE 50 MCG/ACT NA SUSP
2.0000 | Freq: Every day | NASAL | 12 refills | Status: DC
Start: 2018-05-25 — End: 2019-12-06

## 2018-05-25 MED ORDER — PREDNISONE 20 MG PO TABS: 40.0000 mg | ORAL_TABLET | Freq: Every day | ORAL | 0 refills | Status: DC

## 2018-05-25 NOTE — Telephone Encounter (Signed)
rx sent patient aware. Nothing further needed.

## 2018-05-25 NOTE — Telephone Encounter (Signed)
Pt advised to contact pcp due not having a provider in the clinic. Informed message would be sent to Dr. Belia HemanKasa as well.   Pt dx with bronchitis last week by pcp and given abx/prednisone which has been completed. Pt was out of work all week. She returned today and is having sob. She is using rescue inhaler prn sob. Please advise.

## 2018-05-25 NOTE — Telephone Encounter (Signed)
Pt advised.

## 2018-05-25 NOTE — Telephone Encounter (Signed)
Prednisone 40 mg daily for 7 days 

## 2018-05-25 NOTE — Telephone Encounter (Signed)
-----   Message from Erasmo DownerAngela M Bacigalupo, MD sent at 05/25/2018  2:58 PM EDT ----- Normal CXR.  Treatment as discussed with prednisone and symptomatic management  Bacigalupo, Marzella SchleinAngela M, MD, MPH [facility] 05/25/2018 2:58 PM

## 2018-05-25 NOTE — Progress Notes (Signed)
Patient: Nancy Blake Female    DOB: Aug 05, 1966   52 y.o.   MRN: [id number] Visit Date: 05/25/2018  Today's Provider: Shirlee Latch, MD   I, Joslyn Hy, CMA, am acting as scribe for Shirlee Latch, MD.  Chief Complaint  Patient presents with  . URI   Subjective:    URI   Chronicity: Pt was seen 05/19/2018, and was prescribed Levaquin and Prednisone. She also contacted her pulmonologist, and Dr. Belia Heman prescribed Prednisone 40 mg x 7 days today. Pt was advised she needed to see her PCP to be evaluated. The problem has been gradually improving (but lingering). There has been no fever. Associated symptoms include congestion, coughing (dry), diarrhea, headaches, a plugged ear sensation, sinus pain, a sore throat (improved) and wheezing. Pertinent negatives include no abdominal pain, chest pain, dysuria, ear pain, nausea, neck pain, rhinorrhea, sneezing, swollen glands or vomiting. Treatments tried: DuoNeb, Levaquin, Prednsione, Tylenol Sinus, Tylenol. The treatment provided mild relief.   Using nebs once dialy, including this AM Taking Breo consistently daily    No Known Allergies   Current Outpatient Medications:  .  acetaminophen (TYLENOL) 500 MG tablet, Take 500 mg by mouth every 6 (six) hours as needed., Disp: , Rfl:  .  cetirizine (ZYRTEC) 10 MG tablet, Take 1 tablet (10 mg total) by mouth daily., Disp: 90 tablet, Rfl: 3 .  fluticasone furoate-vilanterol (BREO ELLIPTA) 200-25 MCG/INH AEPB, Inhale 1 puff into the lungs daily., Disp: 180 each, Rfl: 2 .  glucose blood test strip, Use as instructed, Disp: 100 each, Rfl: 12 .  ibuprofen (ADVIL,MOTRIN) 200 MG tablet, Take 600 mg by mouth every 6 (six) hours as needed for mild pain or moderate pain., Disp: , Rfl:  .  ipratropium-albuterol (DUONEB) 0.5-2.5 (3) MG/3ML SOLN, Take 3 mLs by nebulization every 4 (four) hours as needed., Disp: 360 mL, Rfl: 1 .  lansoprazole (PREVACID) 15 MG capsule, Take 15 mg by mouth daily  at 12 noon., Disp: , Rfl:  .  Multiple Vitamin (MULTIVITAMIN) tablet, Take 1 tablet by mouth daily., Disp: , Rfl:  .  albuterol (PROVENTIL HFA;VENTOLIN HFA) 108 (90 Base) MCG/ACT inhaler, Inhale 2 puffs into the lungs every 6 (six) hours as needed for wheezing or shortness of breath. (Patient not taking: Reported on 05/25/2018), Disp: 1 Inhaler, Rfl: 3 .  benzonatate (TESSALON) 200 MG capsule, Take 1 capsule (200 mg total) by mouth 3 (three) times daily as needed for cough. (Patient not taking: Reported on 05/25/2018), Disp: 30 capsule, Rfl: 1 .  fluticasone (FLONASE) 50 MCG/ACT nasal spray, Place 2 sprays into both nostrils daily. (Patient not taking: Reported on 05/25/2018), Disp: 16 g, Rfl: 8 .  predniSONE (DELTASONE) 20 MG tablet, Take 2 tablets (40 mg total) by mouth daily. (Patient not taking: Reported on 05/25/2018), Disp: 14 tablet, Rfl: 0  Review of Systems  HENT: Positive for congestion, sinus pain and sore throat (improved). Negative for ear pain, rhinorrhea and sneezing.   Respiratory: Positive for cough (dry) and wheezing.   Cardiovascular: Negative for chest pain.  Gastrointestinal: Positive for diarrhea. Negative for abdominal pain, nausea and vomiting.  Genitourinary: Negative for dysuria.  Musculoskeletal: Negative for neck pain.  Neurological: Positive for headaches.    Social History   Tobacco Use  . Smoking status: Former Smoker    Last attempt to quit: 10/21/1983    Years since quitting: 34.6  . Smokeless tobacco: Never Used  Substance Use Topics  . Alcohol use: No  Objective:   BP 112/82 (BP Location: Left Arm, Patient Position: Sitting, Cuff Size: Large)   Pulse 80   Temp 97.8 F (36.6 C) (Oral)   Resp 16   Wt 238 lb (108 kg)   SpO2 99%   BMI 40.85 kg/m  Vitals:   05/25/18 1127  BP: 112/82  Pulse: 80  Resp: 16  Temp: 97.8 F (36.6 C)  TempSrc: Oral  SpO2: 99%  Weight: 238 lb (108 kg)     Physical Exam  Constitutional: She is oriented to person,  place, and time. She appears well-developed and well-nourished. No distress.  HENT:  Head: Normocephalic and atraumatic.  Right Ear: External ear normal.  Left Ear: External ear normal.  Nose: Nose normal.  Mouth/Throat: Oropharynx is clear and moist.  Eyes: Conjunctivae are normal. Right eye exhibits no discharge. Left eye exhibits no discharge. No scleral icterus.  Neck: Neck supple. No thyromegaly present.  Cardiovascular: Normal rate, regular rhythm, normal heart sounds and intact distal pulses.  No murmur heard. Pulmonary/Chest: No accessory muscle usage. No tachypnea. No respiratory distress. She has decreased breath sounds in the right lower field and the left lower field. She has no wheezes. She has no rhonchi.  No distress, but sounds SOB when talking.  Able to speak in full sentences though  Musculoskeletal: She exhibits no edema.  Lymphadenopathy:    She has no cervical adenopathy.  Neurological: She is alert and oriented to person, place, and time.  Skin: Skin is warm and dry. Capillary refill takes less than 2 seconds. No rash noted.  Psychiatric: She has a normal mood and affect. Her behavior is normal.  Vitals reviewed.      Assessment & Plan:   1. Moderate persistent reactive airway disease with acute exacerbation -Patient's lung exam is actually relatively clear of wheezing today - She is still short of breath despite treatment with prednisone and Levaquin, however -Agree with course of prednisone as prescribed by pulmonology She does have follow-up with pulmonology next week -As she continues to have decreased breath sounds and shortness of breath, I will obtain chest x-ray today as well -We will treat as indicated by chest x-ray if additional treatment is needed - DG Chest 2 View; Future  2. Seasonal allergic rhinitis due to pollen - Her upper respiratory congestion is likely related to her allergic rhinitis and postnasal drip - She does not tolerate Flonase due  to nosebleeding, so I will try Nasonex instead   Meds ordered this encounter  Medications  . mometasone (NASONEX) 50 MCG/ACT nasal spray    Sig: Place 2 sprays into the nose daily.    Dispense:  17 g    Refill:  12     Return if symptoms worsen or fail to improve.   The entirety of the information documented in the History of Present Illness, Review of Systems and Physical Exam were personally obtained by me. Portions of this information were initially documented by Irving BurtonEmily Ratchford, CMA and reviewed by me for thoroughness and accuracy.    Erasmo DownerBacigalupo, Angela M, MD, MPH [facility] 05/25/2018 11:51 AM

## 2018-05-25 NOTE — Telephone Encounter (Signed)
Patient calling stating last week Dr Beryle FlockBacigalupo DX her with Bronchitis and she finished her Prednisone  and antibiotic  She is calling to see if we had any advise for she is still a little SOB   Would like a call back today is her first day back at work

## 2018-05-28 ENCOUNTER — Telehealth: Payer: Self-pay

## 2018-05-28 NOTE — Telephone Encounter (Signed)
Pt called stating the [employer] is going to be faxing over a form for when she was out.  She says she was out of work from 07/29-08/07.  Please let her know when the form is completed.  She says you can call her desk number and leave a message.  [phone].    Thanks,   -Vernona RiegerLaura

## 2018-05-29 NOTE — Telephone Encounter (Signed)
Form completed.  Need to attach OV notes.  Erasmo DownerBacigalupo, Shareef Eddinger M, MD, MPH [facility] 05/29/2018 2:56 PM

## 2018-06-01 NOTE — Telephone Encounter (Signed)
OV notes attached and faxed to the [employer].

## 2018-06-01 NOTE — Telephone Encounter (Signed)
Left message advising pt at phone # provided below.

## 2018-06-03 ENCOUNTER — Ambulatory Visit: Payer: Managed Care, Other (non HMO) | Admitting: Internal Medicine

## 2018-06-03 ENCOUNTER — Encounter: Payer: Self-pay | Admitting: Internal Medicine

## 2018-06-03 VITALS — BP 118/78 | HR 108 | Ht 64.0 in | Wt 241.0 lb

## 2018-06-03 DIAGNOSIS — J4520 Mild intermittent asthma, uncomplicated: Secondary | ICD-10-CM | POA: Diagnosis not present

## 2018-06-03 MED ORDER — IPRATROPIUM-ALBUTEROL 0.5-2.5 (3) MG/3ML IN SOLN: 3.0000 mL | RESPIRATORY_TRACT | 1 refills | Status: DC | PRN

## 2018-06-03 NOTE — Patient Instructions (Signed)
Continue inhalers as prescribed  Continue CPAP as prescribed  AVOID ALLERGENS

## 2018-06-03 NOTE — Progress Notes (Signed)
[facility]      Date: 06/03/2018,   MRN# [id number] Nancy Blake January 21, 1966    AdmissionWeight: 241 lb (109.3 kg)                 CurrentWeight: 241 lb (109.3 kg) Nancy Blake is a 52 y.o. old female seen in for acute care visit of cough, nasal congestion, sob, and chest tightness     CHIEF COMPLAINT:  Follow up asthma  New dx of OSA    HISTORY OF PRESENT ILLNESS  Patient with recent asthma exacerbation treated with several rounds of prednisone with antibiotics Patient is feeling better since her therapy But still fatigued Most likely had a viral upper respiratory tract infection Still has some residual cough and sore throat but feels better overall  Compliant with meds  Current Regimen: BREO 200, flonase, zyrtec She uses Nasonex   PFT  01/03/2016 reviewed with patient Ratio 88% FEV1 98% FVC 87% DLCO 95%  6MWT WNL  no signs of infection at this time No signs of CHF at this time  New dx of OSA previous office visit compliance report AHI was 28, also dropped 02 sats to 77% Patient started auotoCPAP AHI down to 0.6 93% compliant with days 63% with >4 hrs  No signs of infection at this time No signs of CHF  Fatigue has improved Daytime sleepiness also improved    Current Medication:  Current Outpatient Medications:  .  acetaminophen (TYLENOL) 500 MG tablet, Take 500 mg by mouth every 6 (six) hours as needed., Disp: , Rfl:  .  albuterol (PROVENTIL HFA;VENTOLIN HFA) 108 (90 Base) MCG/ACT inhaler, Inhale 2 puffs into the lungs every 6 (six) hours as needed for wheezing or shortness of breath., Disp: 1 Inhaler, Rfl: 3 .  benzonatate (TESSALON) 200 MG capsule, Take 1 capsule (200 mg total) by mouth 3 (three) times daily as needed for cough., Disp: 30 capsule, Rfl: 1 .  cetirizine (ZYRTEC) 10 MG tablet, Take 1 tablet (10 mg total) by mouth daily., Disp: 90 tablet, Rfl: 3 .  fluticasone furoate-vilanterol (BREO ELLIPTA)  200-25 MCG/INH AEPB, Inhale 1 puff into the lungs daily., Disp: 180 each, Rfl: 2 .  glucose blood test strip, Use as instructed, Disp: 100 each, Rfl: 12 .  ibuprofen (ADVIL,MOTRIN) 200 MG tablet, Take 600 mg by mouth every 6 (six) hours as needed for mild pain or moderate pain., Disp: , Rfl:  .  ipratropium-albuterol (DUONEB) 0.5-2.5 (3) MG/3ML SOLN, Take 3 mLs by nebulization every 4 (four) hours as needed., Disp: 360 mL, Rfl: 1 .  lansoprazole (PREVACID) 15 MG capsule, Take 15 mg by mouth daily at 12 noon., Disp: , Rfl:  .  mometasone (NASONEX) 50 MCG/ACT nasal spray, Place 2 sprays into the nose daily., Disp: 17 g, Rfl: 12 .  Multiple Vitamin (MULTIVITAMIN) tablet, Take 1 tablet by mouth daily., Disp: , Rfl:     ALLERGIES   Patient has no known allergies.     REVIEW OF SYSTEMS   Review of Systems  Constitutional: Negative for chills, fever and weight loss.  HENT: Negative for congestion and sore throat.   Eyes: Negative for blurred vision.  Respiratory: Negative for cough, hemoptysis, sputum production, shortness of breath and wheezing.   Cardiovascular: Negative for chest pain, palpitations and leg swelling.  Gastrointestinal: Negative for heartburn and nausea.  All other systems reviewed and are negative.    VS: BP 118/78 (BP Location: Left Arm, Cuff Size: Normal)  Pulse (!) 108   Ht 5\' 4"  (1.626 m)   Wt 241 lb (109.3 kg)   SpO2 97%   BMI 41.37 kg/m    BP 118/78 (BP Location: Left Arm, Cuff Size: Normal)   Pulse (!) 108   Ht 5\' 4"  (1.626 m)   Wt 241 lb (109.3 kg)   SpO2 97%   BMI 41.37 kg/m    PHYSICAL EXAM  Physical Exam  Constitutional: She is oriented to person, place, and time. No distress.  Cardiovascular: Normal rate, regular rhythm and normal heart sounds.  No murmur heard. Pulmonary/Chest: No stridor. No respiratory distress. She has no wheezes. She has no rales.  Musculoskeletal: She exhibits no edema.  Neurological: She is alert and oriented to  person, place, and time. No cranial nerve deficit.  Skin: Skin is warm. She is not diaphoretic.  Psychiatric: She has a normal mood and affect.       ASSESSMENT/PLAN  52 yo white female seen today for signs and symptoms of persistent allerghic rhinitis with reactive ariway disease c/w ASTHMA-mild intermittent with OSA with setting of obesity and deconditioned state Patient with recent asthma exacerbation status post prednisone and antibiotic therapy has now fully recovered   Allergic rhinitis Continue intranasal steroids as needed Discussed use of air purifier in bedroom zyrtec 10 mg daily  pillows and sheets in drier and works well to kill dust mites  Asthma mild intermittent-well controlled now Continue breo continue albuterol and dounebs as needed  GERD-continue PPI  Obesity -recommend significant weight loss -recommend changing diet  Deconditioned state -Recommend increased daily activity and exercise  Excessive daytime sleepiness +OSA Continue autoCPAP 5-20 as prescribed Increase compliance recommended >4hr per night   Follow up in 6 months  Patient satisfied with Plan of action and management. All questions answered  Lucie LeatherKurian David Sherree Shankman, M.D.  [facility]  Medical Director [facility] Medical Director [facility]

## 2018-06-16 ENCOUNTER — Ambulatory Visit
Admission: RE | Admit: 2018-06-16 | Discharge: 2018-06-16 | Disposition: A | Discharge status: Home / Self Care | Payer: Managed Care, Other (non HMO) | Source: Ambulatory Visit | Attending: Family Medicine | Admitting: Family Medicine

## 2018-06-16 DIAGNOSIS — Z1231 Encounter for screening mammogram for malignant neoplasm of breast: Secondary | ICD-10-CM | POA: Diagnosis present

## 2018-06-16 DIAGNOSIS — Z1239 Encounter for other screening for malignant neoplasm of breast: Secondary | ICD-10-CM

## 2018-06-17 ENCOUNTER — Telehealth: Payer: Self-pay

## 2018-06-17 NOTE — Telephone Encounter (Signed)
-----   Message from Erasmo DownerAngela M Bacigalupo, MD sent at 06/17/2018  9:32 AM EDT ----- Normal mammogram. Repeat in1 yr  Erasmo DownerBacigalupo, Angela M, MD, MPH [facility] 06/17/2018 9:32 AM

## 2018-06-17 NOTE — Telephone Encounter (Signed)
Attempted to contact patient. No answer nor voicemail.  

## 2018-06-17 NOTE — Telephone Encounter (Signed)
Pt advised.   Thanks,   -Katriona Schmierer  

## 2018-08-19 ENCOUNTER — Ambulatory Visit (INDEPENDENT_AMBULATORY_CARE_PROVIDER_SITE_OTHER): Payer: Managed Care, Other (non HMO)

## 2018-08-19 DIAGNOSIS — Z23 Encounter for immunization: Secondary | ICD-10-CM

## 2018-09-04 ENCOUNTER — Other Ambulatory Visit: Payer: Self-pay | Admitting: Internal Medicine

## 2018-10-26 ENCOUNTER — Ambulatory Visit: Payer: Managed Care, Other (non HMO) | Admitting: Family Medicine

## 2018-12-03 ENCOUNTER — Ambulatory Visit: Payer: Managed Care, Other (non HMO) | Admitting: Internal Medicine

## 2018-12-03 ENCOUNTER — Ambulatory Visit: Payer: Managed Care, Other (non HMO) | Admitting: Family Medicine

## 2018-12-03 ENCOUNTER — Encounter: Payer: Self-pay | Admitting: Family Medicine

## 2018-12-03 ENCOUNTER — Encounter: Payer: Self-pay | Admitting: Internal Medicine

## 2018-12-03 VITALS — BP 122/72 | HR 103 | Ht 64.0 in | Wt 244.2 lb

## 2018-12-03 VITALS — BP 139/79 | HR 93 | Temp 98.4°F | Wt 244.6 lb

## 2018-12-03 DIAGNOSIS — J4520 Mild intermittent asthma, uncomplicated: Secondary | ICD-10-CM

## 2018-12-03 DIAGNOSIS — G4733 Obstructive sleep apnea (adult) (pediatric): Secondary | ICD-10-CM

## 2018-12-03 DIAGNOSIS — E1169 Type 2 diabetes mellitus with other specified complication: Secondary | ICD-10-CM | POA: Insufficient documentation

## 2018-12-03 DIAGNOSIS — E119 Type 2 diabetes mellitus without complications: Secondary | ICD-10-CM | POA: Diagnosis not present

## 2018-12-03 DIAGNOSIS — E785 Hyperlipidemia, unspecified: Secondary | ICD-10-CM

## 2018-12-03 LAB — POCT GLYCOSYLATED HEMOGLOBIN (HGB A1C): Hemoglobin A1C: 7.2 % — AB (ref 4.0–5.6)

## 2018-12-03 MED ORDER — METFORMIN HCL 500 MG PO TABS: 500.0000 mg | ORAL_TABLET | Freq: Two times a day (BID) | ORAL | 3 refills | Status: DC

## 2018-12-03 NOTE — Assessment & Plan Note (Signed)
Was previously diet contorlled A1c increasign and now >7 Start Metformin 500mg  BID Patient advised to schedule eye exam Needs microalbumin UTD on vaccinations F/u in 3 months and recheck A1c Discussed importance of statin, but patient declines at this time

## 2018-12-03 NOTE — Assessment & Plan Note (Signed)
Discussed healthy weight management Discussed diet and exercise 

## 2018-12-03 NOTE — Patient Instructions (Signed)
Start Metformin 500mg  twice daily  Schedule an eye exam   Diabetes Mellitus and Exercise Exercising regularly is important for your overall health, especially when you have diabetes (diabetes mellitus). Exercising is not only about losing weight. It has many other health benefits, such as increasing muscle strength and bone density and reducing body fat and stress. This leads to improved fitness, flexibility, and endurance, all of which result in better overall health. Exercise has additional benefits for people with diabetes, including:  Reducing appetite.  Helping to lower and control blood glucose.  Lowering blood pressure.  Helping to control amounts of fatty substances (lipids) in the blood, such as cholesterol and triglycerides.  Helping the body to respond better to insulin (improving insulin sensitivity).  Reducing how much insulin the body needs.  Decreasing the risk for heart disease by: ? Lowering cholesterol and triglyceride levels. ? Increasing the levels of good cholesterol. ? Lowering blood glucose levels. What is my activity plan? Your health care provider or certified diabetes educator can help you make a plan for the type and frequency of exercise (activity plan) that works for you. Make sure that you:  Do at least 150 minutes of moderate-intensity or vigorous-intensity exercise each week. This could be brisk walking, biking, or water aerobics. ? Do stretching and strength exercises, such as yoga or weightlifting, at least 2 times a week. ? Spread out your activity over at least 3 days of the week.  Get some form of physical activity every day. ? Do not go more than 2 days in a row without some kind of physical activity. ? Avoid being inactive for more than 30 minutes at a time. Take frequent breaks to walk or stretch.  Choose a type of exercise or activity that you enjoy, and set realistic goals.  Start slowly, and gradually increase the intensity of your  exercise over time. What do I need to know about managing my diabetes?   Check your blood glucose before and after exercising. ? If your blood glucose is 240 mg/dL (80.8 mmol/L) or higher before you exercise, check your urine for ketones. If you have ketones in your urine, do not exercise until your blood glucose returns to normal. ? If your blood glucose is 100 mg/dL (5.6 mmol/L) or lower, eat a snack containing 15-20 grams of carbohydrate. Check your blood glucose 15 minutes after the snack to make sure that your level is above 100 mg/dL (5.6 mmol/L) before you start your exercise.  Know the symptoms of low blood glucose (hypoglycemia) and how to treat it. Your risk for hypoglycemia increases during and after exercise. Common symptoms of hypoglycemia can include: ? Hunger. ? Anxiety. ? Sweating and feeling clammy. ? Confusion. ? Dizziness or feeling light-headed. ? Increased heart rate or palpitations. ? Blurry vision. ? Tingling or numbness around the mouth, lips, or tongue. ? Tremors or shakes. ? Irritability.  Keep a rapid-acting carbohydrate snack available before, during, and after exercise to help prevent or treat hypoglycemia.  Avoid injecting insulin into areas of the body that are going to be exercised. For example, avoid injecting insulin into: ? The arms, when playing tennis. ? The legs, when jogging.  Keep records of your exercise habits. Doing this can help you and your health care provider adjust your diabetes management plan as needed. Write down: ? Food that you eat before and after you exercise. ? Blood glucose levels before and after you exercise. ? The type and amount of exercise you have  done. ? When your insulin is expected to peak, if you use insulin. Avoid exercising at times when your insulin is peaking.  When you start a new exercise or activity, work with your health care provider to make sure the activity is safe for you, and to adjust your insulin,  medicines, or food intake as needed.  Drink plenty of water while you exercise to prevent dehydration or heat stroke. Drink enough fluid to keep your urine clear or pale yellow. Summary  Exercising regularly is important for your overall health, especially when you have diabetes (diabetes mellitus).  Exercising has many health benefits, such as increasing muscle strength and bone density and reducing body fat and stress.  Your health care provider or certified diabetes educator can help you make a plan for the type and frequency of exercise (activity plan) that works for you.  When you start a new exercise or activity, work with your health care provider to make sure the activity is safe for you, and to adjust your insulin, medicines, or food intake as needed. This information is not intended to replace advice given to you by your health care provider. Make sure you discuss any questions you have with your health care provider. Document Released: 12/28/2003 Document Revised: 04/17/2017 Document Reviewed: 03/18/2016 Elsevier Interactive Patient Education  2019 [employer].

## 2018-12-03 NOTE — Patient Instructions (Signed)
Continue inhalers as prescribed Continue CPAP as prescribed 

## 2018-12-03 NOTE — Progress Notes (Signed)
Patient: Nancy Blake Female    DOB: December 13, 1965   53 y.o.   MRN: [id number] Visit Date: 12/03/2018  Today's Provider: Shirlee LatchAngela Bacigalupo, MD   Chief Complaint  Patient presents with  . Diabetes   Subjective:    I, Presley RaddleNikki Walston, CMA, am acting as a Neurosurgeonscribe for Shirlee LatchAngela Bacigalupo, MD.   HPI  Diabetes Mellitus Type II, Follow-up:   Lab Results  Component Value Date   HGBA1C 6.5 (H) 04/17/2018   HGBA1C 6.1 (H) 02/12/2017   HGBA1C 6.2 04/17/2016    Last seen for diabetes 6 months ago.  Management since then includes no changes. Patient does not take any medications for diabetes. She reports good compliance with treatment. She is not having side effects.  Current symptoms include none  Home blood sugar records: being checked periodically  Episodes of hypoglycemia? no   Current Insulin Regimen: none Most Recent Eye Exam: unknown  Weight trend: stable Prior visit with dietician: no Current diet: low carb and low sugar Current exercise: walking and using fitbit  Pertinent Labs:    Component Value Date/Time   CHOL 153 04/17/2018 0810   TRIG 92 04/17/2018 0810   HDL 48 04/17/2018 0810   LDLCALC 87 04/17/2018 0810   CREATININE 0.81 04/17/2018 0810    Wt Readings from Last 3 Encounters:  12/03/18 244 lb 9.6 oz (110.9 kg)  12/03/18 244 lb 3.2 oz (110.8 kg)  06/03/18 241 lb (109.3 kg)    ------------------------------------------------------------------------ OSA: Followed by Pulm.  Using CPAP with good compliance.    Working on weight loss with low carb diet.  Bought a fitbit and trying to move more during the day.  Has never taken a statin.  No Known Allergies   Current Outpatient Medications:  .  acetaminophen (TYLENOL) 500 MG tablet, Take 500 mg by mouth every 6 (six) hours as needed., Disp: , Rfl:  .  albuterol (PROVENTIL HFA;VENTOLIN HFA) 108 (90 Base) MCG/ACT inhaler, Inhale 2 puffs into the lungs every 6 (six) hours as needed for wheezing or  shortness of breath., Disp: 1 Inhaler, Rfl: 3 .  cetirizine (ZYRTEC) 10 MG tablet, TAKE 1 TABLET BY MOUTH EVERY DAY, Disp: 30 tablet, Rfl: 11 .  fluticasone furoate-vilanterol (BREO ELLIPTA) 200-25 MCG/INH AEPB, Inhale 1 puff into the lungs daily., Disp: 180 each, Rfl: 2 .  glucose blood test strip, Use as instructed, Disp: 100 each, Rfl: 12 .  ibuprofen (ADVIL,MOTRIN) 200 MG tablet, Take 600 mg by mouth every 6 (six) hours as needed for mild pain or moderate pain., Disp: , Rfl:  .  ipratropium-albuterol (DUONEB) 0.5-2.5 (3) MG/3ML SOLN, Take 3 mLs by nebulization every 4 (four) hours as needed., Disp: 360 mL, Rfl: 1 .  lansoprazole (PREVACID) 15 MG capsule, Take 15 mg by mouth daily at 12 noon., Disp: , Rfl:  .  mometasone (NASONEX) 50 MCG/ACT nasal spray, Place 2 sprays into the nose daily., Disp: 17 g, Rfl: 12 .  Multiple Vitamin (MULTIVITAMIN) tablet, Take 1 tablet by mouth daily., Disp: , Rfl:   Review of Systems  Constitutional: Negative.   Respiratory: Negative.   Cardiovascular: Negative.   Endocrine: Negative.   Musculoskeletal: Negative.     Social History   Tobacco Use  . Smoking status: Former Smoker    Last attempt to quit: 10/21/1983    Years since quitting: 35.1  . Smokeless tobacco: Never Used  Substance Use Topics  . Alcohol use: No      Objective:  BP 139/79 (BP Location: Right Arm, Patient Position: Sitting, Cuff Size: Normal)   Pulse 93   Temp 98.4 F (36.9 C) (Oral)   Wt 244 lb 9.6 oz (110.9 kg)   SpO2 97%   BMI 41.99 kg/m  Vitals:   12/03/18 1120  BP: 139/79  Pulse: 93  Temp: 98.4 F (36.9 C)  TempSrc: Oral  SpO2: 97%  Weight: 244 lb 9.6 oz (110.9 kg)     Physical Exam Vitals signs reviewed.  Constitutional:      General: She is not in acute distress.    Appearance: Normal appearance. She is not diaphoretic.  HENT:     Head: Normocephalic and atraumatic.  Eyes:     General: No scleral icterus.    Conjunctiva/sclera: Conjunctivae  normal.  Neck:     Musculoskeletal: Neck supple.  Cardiovascular:     Rate and Rhythm: Normal rate and regular rhythm.     Pulses: Normal pulses.     Heart sounds: Normal heart sounds. No murmur.  Pulmonary:     Effort: Pulmonary effort is normal. No respiratory distress.     Breath sounds: Normal breath sounds. No wheezing or rhonchi.  Musculoskeletal:     Right lower leg: No edema.     Left lower leg: No edema.  Lymphadenopathy:     Cervical: No cervical adenopathy.  Skin:    General: Skin is warm and dry.     Capillary Refill: Capillary refill takes less than 2 seconds.     Findings: No rash.  Neurological:     Mental Status: She is alert and oriented to person, place, and time. Mental status is at baseline.  Psychiatric:        Mood and Affect: Mood normal.        Behavior: Behavior normal.     Results for orders placed or performed in visit on 12/03/18  POCT HgB A1C  Result Value Ref Range   Hemoglobin A1C 7.2 (A) 4.0 - 5.6 %   HbA1c POC (<> result, manual entry)     HbA1c, POC (prediabetic range)     HbA1c, POC (controlled diabetic range)         Assessment & Plan   Problem List Items Addressed This Visit      Respiratory   OSA (obstructive sleep apnea)    Well controlled Followed by Pulm Continue CPAP        Endocrine   Diabetes (HCC) - Primary    Was previously diet contorlled A1c increasign and now >7 Start Metformin 500mg  BID Patient advised to schedule eye exam Needs microalbumin UTD on vaccinations F/u in 3 months and recheck A1c Discussed importance of statin, but patient declines at this time      Relevant Medications   metFORMIN (GLUCOPHAGE) 500 MG tablet   Other Relevant Orders   POCT HgB A1C (Completed)   Hyperlipidemia associated with type 2 diabetes mellitus (HCC)    Last LDL not to goal of <70 Discussed importance of statin Patient declines at this time Discussed diet and exercise      Relevant Medications   metFORMIN  (GLUCOPHAGE) 500 MG tablet     Other   Morbid obesity (HCC)    Discussed healthy weight management Discussed diet and exercise      Relevant Medications   metFORMIN (GLUCOPHAGE) 500 MG tablet       Return in about 3 months (around 03/03/2019).   The entirety of the information documented in the History of  Present Illness, Review of Systems and Physical Exam were personally obtained by me. Portions of this information were initially documented by Presley Raddle, CMA and reviewed by me for thoroughness and accuracy.    Erasmo Downer, MD, MPH [facility] 12/03/2018 12:49 PM

## 2018-12-03 NOTE — Assessment & Plan Note (Signed)
Well controlled Followed by Pulm Continue CPAP

## 2018-12-03 NOTE — Progress Notes (Signed)
[facility] Consultation      Date: 12/03/2018,   MRN# [id number] Nancy Blake [facility] 10-23-1965    AdmissionWeight: 244 lb 3.2 oz (110.8 kg)                 CurrentWeight: 244 lb 3.2 oz (110.8 kg) Nancy Blake is a 53 y.o. old female seen in for acute care visit of cough, nasal congestion, sob, and chest tightness   PFT  01/03/2016 reviewed with patient Ratio 88% FEV1 98% FVC 87% DLCO 95%  WNL  CHIEF COMPLAINT:   Follow-up asthma Follow-up sleep apnea Follow-up asthma Follow-up asthma   HISTORY OF PRESENT ILLNESS   Asthma seems to be under control at this time No signs of infection No signs of exacerbation Uses Breo 3-4 times per week Albuterol as needed  Current regimen-Breo 200, Flonase, Zyrtec, Nasonex   dx of OSA AHI was 28, also dropped 02 sats to 77% Compliance report today shows 90% compliant for days 77% compliant for greater than 4 hours AHI is 1  Fatigue has improved Has more energy Patient wants to lose more weight she weighs for 244 pounds      Current Medication:  Current Outpatient Medications:  .  acetaminophen (TYLENOL) 500 MG tablet, Take 500 mg by mouth every 6 (six) hours as needed., Disp: , Rfl:  .  albuterol (PROVENTIL HFA;VENTOLIN HFA) 108 (90 Base) MCG/ACT inhaler, Inhale 2 puffs into the lungs every 6 (six) hours as needed for wheezing or shortness of breath., Disp: 1 Inhaler, Rfl: 3 .  benzonatate (TESSALON) 200 MG capsule, Take 1 capsule (200 mg total) by mouth 3 (three) times daily as needed for cough., Disp: 30 capsule, Rfl: 1 .  cetirizine (ZYRTEC) 10 MG tablet, TAKE 1 TABLET BY MOUTH EVERY DAY, Disp: 30 tablet, Rfl: 11 .  fluticasone furoate-vilanterol (BREO ELLIPTA) 200-25 MCG/INH AEPB, Inhale 1 puff into the lungs daily., Disp: 180 each, Rfl: 2 .  glucose blood test strip, Use as instructed, Disp: 100 each, Rfl: 12 .  ibuprofen (ADVIL,MOTRIN) 200 MG tablet, Take 600 mg by mouth every 6 (six) hours as  needed for mild pain or moderate pain., Disp: , Rfl:  .  ipratropium-albuterol (DUONEB) 0.5-2.5 (3) MG/3ML SOLN, Take 3 mLs by nebulization every 4 (four) hours as needed., Disp: 360 mL, Rfl: 1 .  lansoprazole (PREVACID) 15 MG capsule, Take 15 mg by mouth daily at 12 noon., Disp: , Rfl:  .  mometasone (NASONEX) 50 MCG/ACT nasal spray, Place 2 sprays into the nose daily., Disp: 17 g, Rfl: 12 .  Multiple Vitamin (MULTIVITAMIN) tablet, Take 1 tablet by mouth daily., Disp: , Rfl:     ALLERGIES   Patient has no known allergies.     REVIEW OF SYSTEMS   Review of Systems  Constitutional: Negative for chills, fever and weight loss.  HENT: Negative for congestion and sore throat.   Eyes: Negative for blurred vision.  Respiratory: Negative for cough, hemoptysis, sputum production, shortness of breath and wheezing.   Cardiovascular: Negative for chest pain, palpitations and leg swelling.  Gastrointestinal: Negative for heartburn and nausea.  All other systems reviewed and are negative.    VS: BP 122/72 (BP Location: Left Arm, Cuff Size: Normal)   Pulse (!) 103   Ht 5\' 4"  (1.626 m)   Wt 244 lb 3.2 oz (110.8 kg)   SpO2 97%   BMI 41.92 kg/m    BP 122/72 (BP Location: Left Arm, Cuff Size: Normal)  Pulse (!) 103   Ht 5\' 4"  (1.626 m)  Wt 244 lb 3.2 oz (110.8 kg)   SpO2 97%   BMI 41.92 kg/m    PHYSICAL EXAM  Physical Exam  Constitutional: She is oriented to person, place, and time. No distress.  Cardiovascular: Normal rate, regular rhythm and normal heart sounds.  No murmur heard. Pulmonary/Chest: No stridor. No respiratory distress. She has no wheezes. She has no rales.  Musculoskeletal:        General: No edema.  Neurological: She is alert and oriented to person, place, and time. No cranial nerve deficit.  Skin: Skin is warm. She is not diaphoretic.  Psychiatric: She has a normal mood and affect.       ASSESSMENT/PLAN   Patient is here for follow-up asthma mild  intermittent well-controlled at this time and follow-up for obstructive sleep apnea in the setting of morbid obesity and deconditioned state  Allergic asthma mild intermittent well-controlled at this time Using BREO 3-4 times per week is controlling her symptoms Albuterol as needed Avoid allergens  Allergic rhinitis Continue intranasal steroids Air purifier in bedroom Zyrtec 10 mg daily   GERD continue PPI  Obesity -recommend significant weight loss -recommend changing diet  Deconditioned state -Recommend increased daily activity and exercise  OSA Continue auto CPAP 5-20 as prescribed Patient using and benefiting greatly from usage Continue as prescribed    Patient satisfied with Plan of action and management. All questions answered  follow up in 1 year  Dyke Weible Santiago Gladavid Marylou Wages, M.D.  [facility]  Medical Director [facility] Medical Director [facility]

## 2018-12-03 NOTE — Assessment & Plan Note (Signed)
Last LDL not to goal of <70 Discussed importance of statin Patient declines at this time Discussed diet and exercise

## 2018-12-07 ENCOUNTER — Telehealth: Payer: Self-pay | Admitting: Family Medicine

## 2018-12-07 DIAGNOSIS — E119 Type 2 diabetes mellitus without complications: Secondary | ICD-10-CM

## 2018-12-07 MED ORDER — GLUCOSE BLOOD VI STRP: ORAL_STRIP | 12 refills | Status: DC

## 2018-12-07 NOTE — Telephone Encounter (Signed)
Pt needing a Rx for: Test strips for One Touch Verio meter  Please call into:  [facility] [address] [place], [place] - [address] [phone] (Phone) [phone] (Fax)   Thanks, [employer]

## 2018-12-07 NOTE — Telephone Encounter (Signed)
Rx sent 

## 2019-04-09 LAB — HM DIABETES EYE EXAM

## 2019-04-24 IMAGING — MG MM DIGITAL SCREENING BILAT W/ TOMO W/ CAD
8 series · 8 of 24 positions shown · non-contrast
Comparison: Previous exam(s).

ACR Breast Density Category a: The breast tissue is almost entirely
fatty.

CLINICAL DATA: Screening.

EXAM:
DIGITAL SCREENING BILATERAL MAMMOGRAM WITH TOMO AND CAD

[R MLO synth-2D]
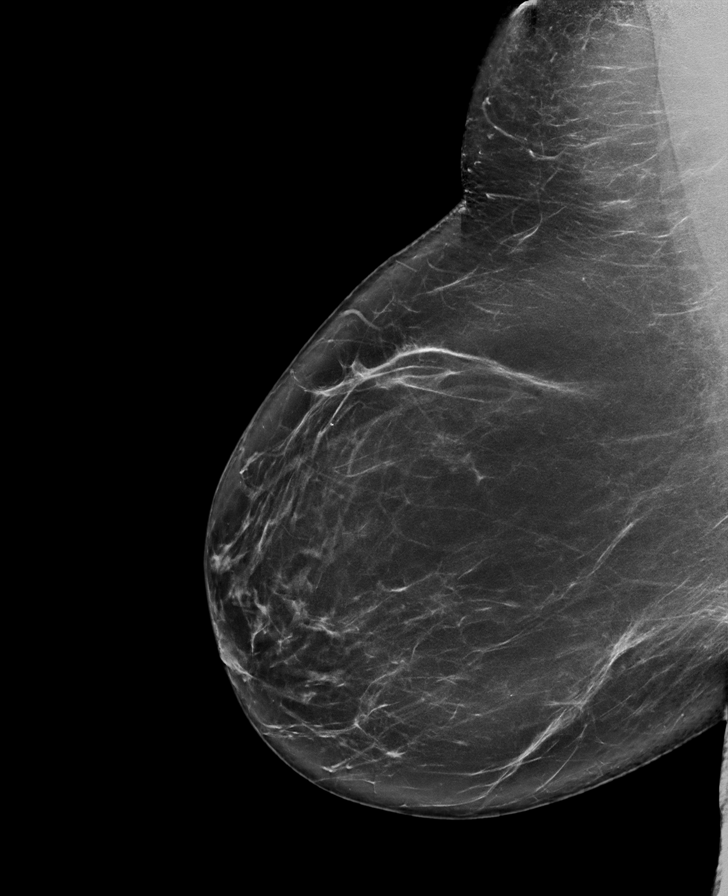

[R CC synth-2D]
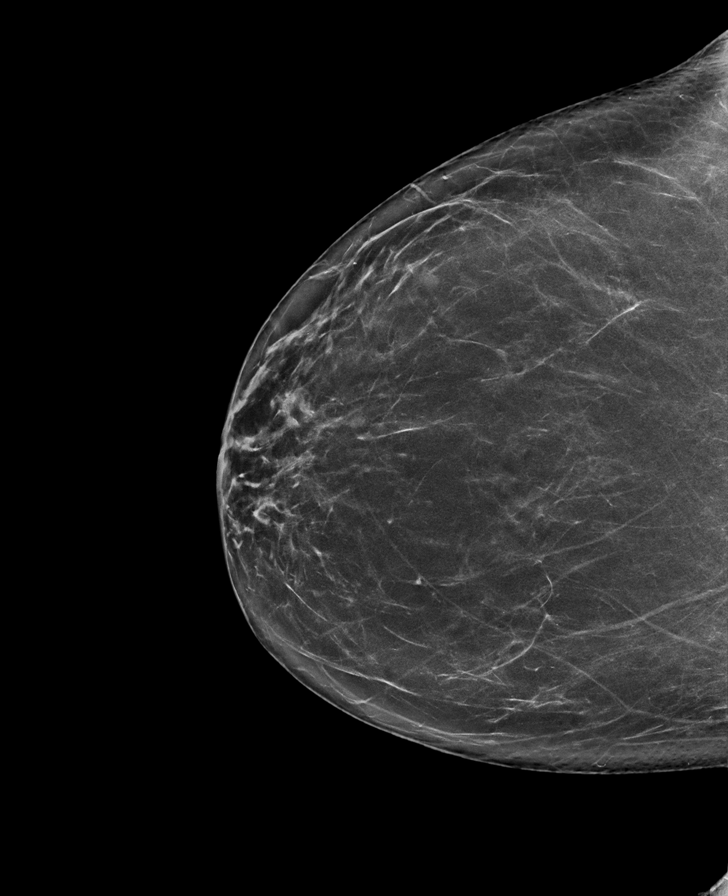

[L CC synth-2D]
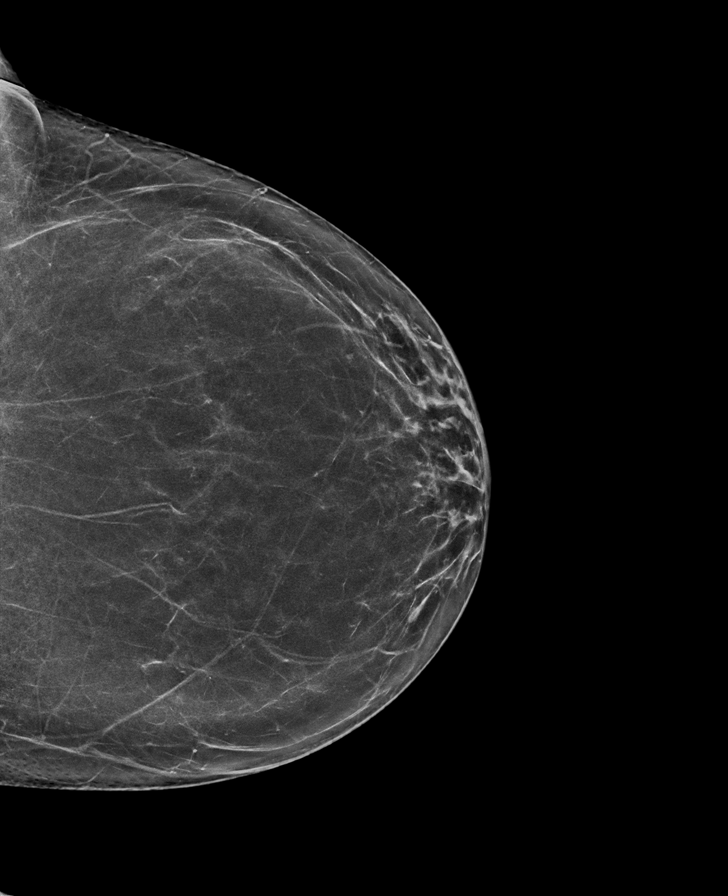

[L MLO synth-2D]
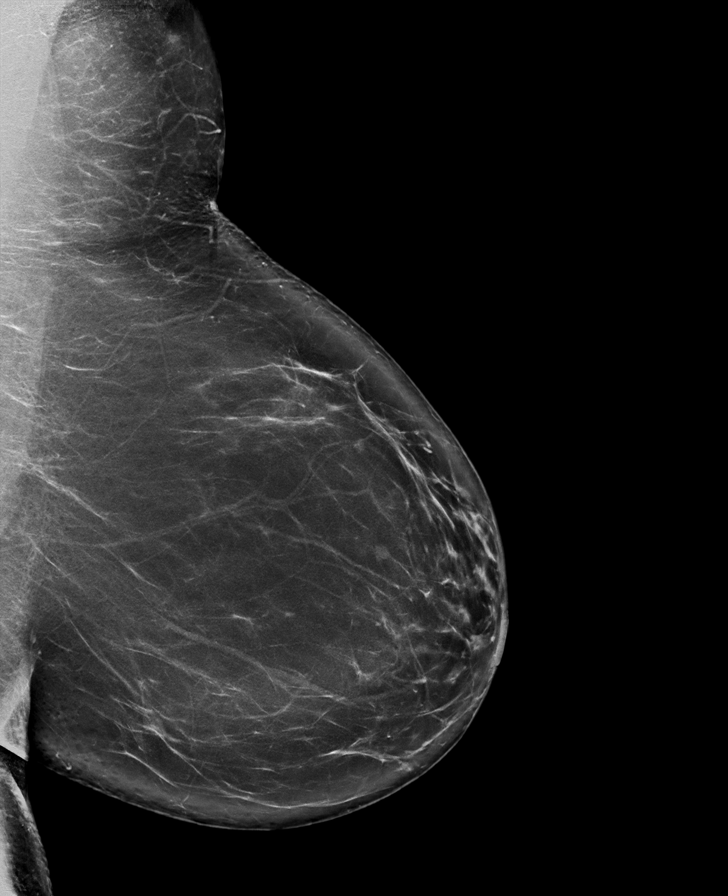

[L MLO tomo · tomo slice 45/88.0]
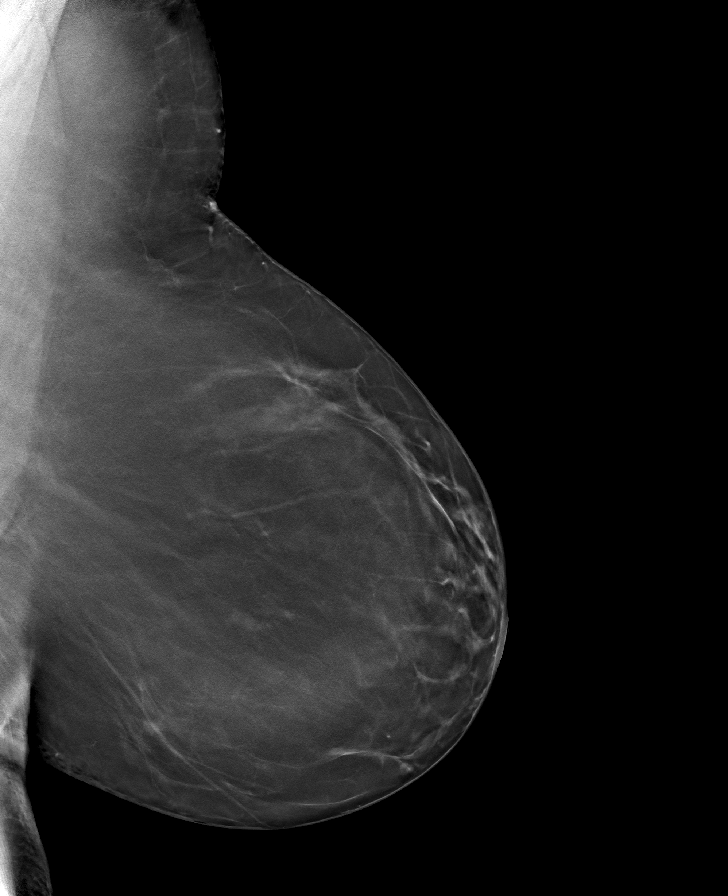

[L CC tomo · tomo slice 35/70.0]
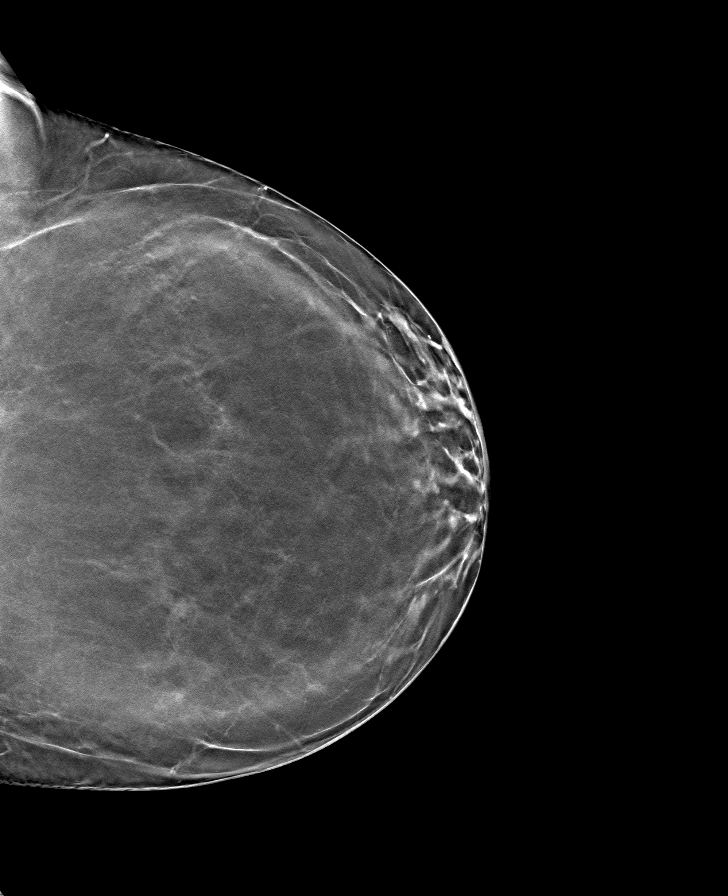

[R MLO tomo · tomo slice 44/87.0]
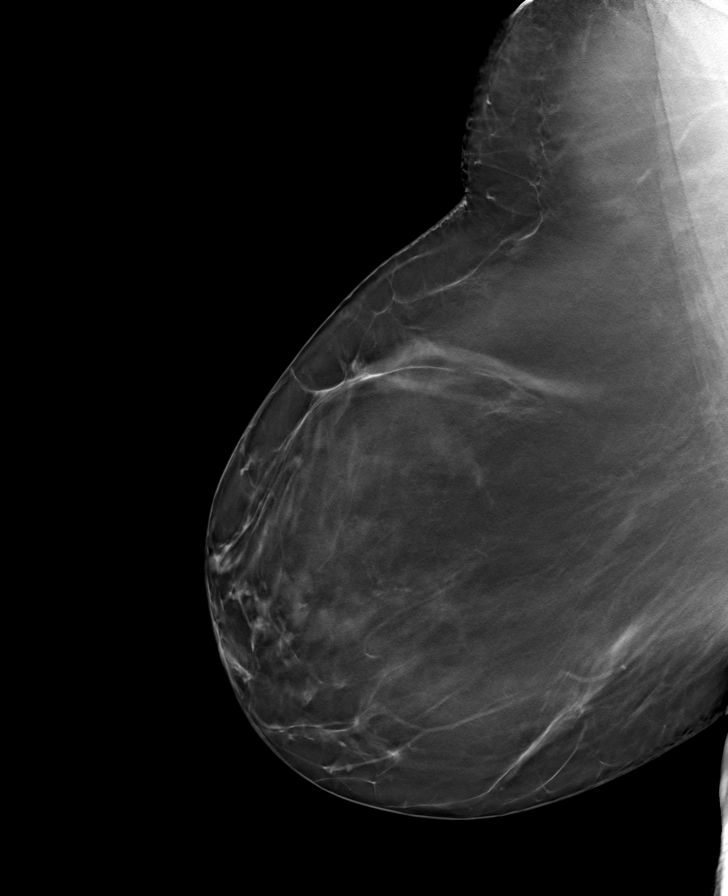

[R CC tomo · tomo slice 37/74.0]
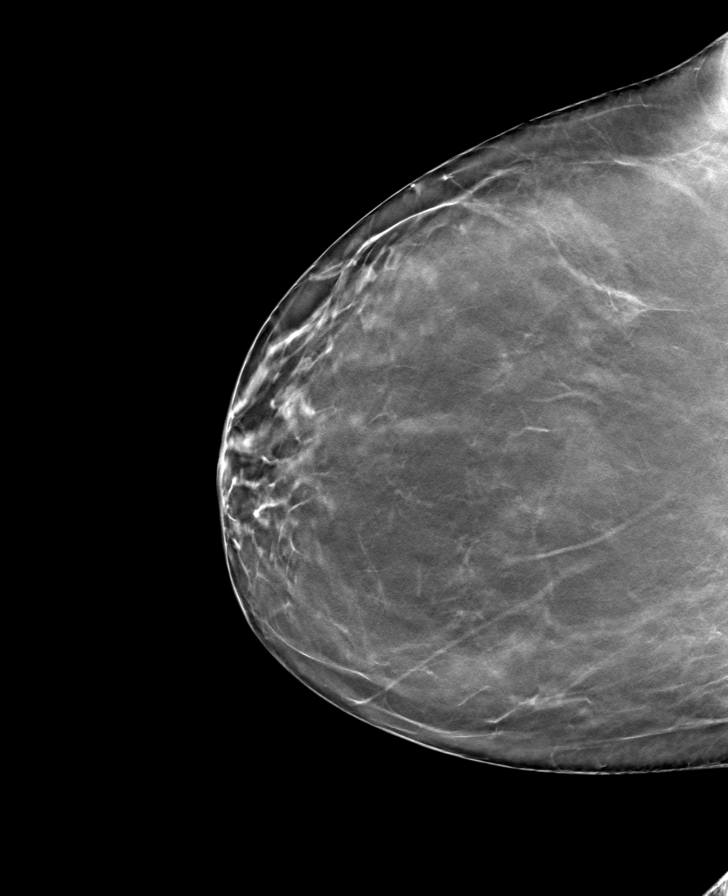

[8 of 24 positions shown; findings below may reference images not displayed]

FINDINGS: There are no findings suspicious for malignancy. Images were
processed with CAD.
IMPRESSION: No mammographic evidence of malignancy. A result letter of this
screening mammogram will be mailed directly to the patient.

RECOMMENDATION:
Screening mammogram in one year. (Code:[id number]-[id number])

BI-RADS CATEGORY  1: Negative.

## 2019-05-17 ENCOUNTER — Telehealth: Payer: Self-pay

## 2019-05-17 NOTE — Telephone Encounter (Signed)
Pt rescheduled her physical from 05/18/2019 to 07/15/2019.  She wanted to reports she is having trouble taking Metformin.  She states it is causing abdominal pain and diarrhea.  She tried only taking once a day but is still having side effects.  She would like to try something else.  Please send to [facility].    Contact number  [phone]   Thanks,   -Mickel Baas

## 2019-05-17 NOTE — Telephone Encounter (Signed)
Pt needs to have a CPE done before August 21st   Contact Number: [phone]

## 2019-05-18 ENCOUNTER — Ambulatory Visit: Payer: Self-pay | Admitting: Family Medicine

## 2019-05-18 NOTE — Telephone Encounter (Signed)
Was due for repeat A1c in May. Let's schedule DM f/u for later this week or next week and we can discuss other medication options and repeat A1c.

## 2019-05-19 NOTE — Telephone Encounter (Signed)
Patient schedule appointment on 05/26/2019 @ 8:40 AM.

## 2019-05-26 ENCOUNTER — Ambulatory Visit: Payer: Managed Care, Other (non HMO) | Admitting: Family Medicine

## 2019-05-26 ENCOUNTER — Encounter: Payer: Self-pay | Admitting: Family Medicine

## 2019-05-26 ENCOUNTER — Other Ambulatory Visit: Payer: Self-pay

## 2019-05-26 VITALS — BP 154/84 | HR 73 | Temp 98.1°F | Wt 245.2 lb

## 2019-05-26 DIAGNOSIS — Z1239 Encounter for other screening for malignant neoplasm of breast: Secondary | ICD-10-CM

## 2019-05-26 DIAGNOSIS — E1169 Type 2 diabetes mellitus with other specified complication: Secondary | ICD-10-CM | POA: Diagnosis not present

## 2019-05-26 DIAGNOSIS — G4733 Obstructive sleep apnea (adult) (pediatric): Secondary | ICD-10-CM | POA: Diagnosis not present

## 2019-05-26 DIAGNOSIS — E785 Hyperlipidemia, unspecified: Secondary | ICD-10-CM

## 2019-05-26 LAB — POCT UA - MICROALBUMIN: Microalbumin Ur, POC: 20 mg/L

## 2019-05-26 MED ORDER — METFORMIN HCL ER 500 MG PO TB24: 500.0000 mg | ORAL_TABLET | Freq: Every day | ORAL | 3 refills | Status: DC

## 2019-05-26 NOTE — Assessment & Plan Note (Signed)
Discussed importance of healthy weight management Discussed diet and exercise  

## 2019-05-26 NOTE — Assessment & Plan Note (Signed)
Uncontrolled on last check  Did not tolerate metformin and stopped ~2 wks ago Discussed Metformin XR Recheck A1c Foot exam today Urine microalbumin today UTD on vaccines and eye exam Check lipid panel - likely needs statin

## 2019-05-26 NOTE — Progress Notes (Signed)
Patient: Nancy DarnerKimberly W Episcopo Female    DOB: [birth year]   53 y.o.   MRN: [id number] Visit Date: 05/26/2019  Today's Provider: Shirlee LatchAngela Zaccheus Edmister, MD   Chief Complaint  Patient presents with  . Diabetes  . Hyperlipidemia   Subjective:    I, Presley RaddleNikki Walston, CMA, am acting as a Neurosurgeonscribe for Shirlee LatchAngela Caydn Justen, MD.    HPI  Diabetes Mellitus Type II, Follow-up:   Recent Labs       Lab Results  Component Value Date   HGBA1C 6.5 (H) 04/17/2018   HGBA1C 6.1 (H) 02/12/2017   HGBA1C 6.2 04/17/2016      Last seen for diabetes 6 months ago.  Management since then includes starting Metformin 500 mg BID She reports poor compliance with treatment. She is  having side effects. Patient reports diarrhea when taking Metformin. She states she stopped taking medication approximately 2 weeks ago.  Current symptoms include none  Home blood sugar records: being checked periodically FBS 120-150's when she was taking Metformin. 157 without medication.  Episodes of hypoglycemia? no              Current Insulin Regimen: none Most Recent Eye Exam: unknown  Weight trend: stable Prior visit with dietician: no Current diet: low carb and low sugar Current exercise: walking and using fitbit  Pertinent Labs: Lab Results  Component Value Date   CHOL 153 04/17/2018   HDL 48 04/17/2018   LDLCALC 87 04/17/2018   TRIG 92 04/17/2018   CHOLHDL 3.2 04/17/2018       Wt Readings from Last 3 Encounters:  12/03/18 244 lb 9.6 oz (110.9 kg)  12/03/18 244 lb 3.2 oz (110.8 kg)  06/03/18 241 lb (109.3 kg)    ------------------------------------------------------------------------  Lipid/Cholesterol, Follow-up:   Last seen for this 6 months ago.  Management changes since that visit include patient declined starting a medication.  Last Lipid Panel:    Component Value Date/Time   CHOL 153 04/17/2018 0810   TRIG 92 04/17/2018 0810   HDL 48 04/17/2018 0810   CHOLHDL 3.2 04/17/2018 0810   LDLCALC 87 04/17/2018 0810    Current symptoms include none  Weight trend: stable Prior visit with dietician: no Current diet: in general, an "unhealthy" diet Current exercise: none  Wt Readings from Last 3 Encounters:  05/26/19 245 lb 3.2 oz (111.2 kg)  12/03/18 244 lb 9.6 oz (110.9 kg)  12/03/18 244 lb 3.2 oz (110.8 kg)    -------------------------------------------------------------------    No Known Allergies   Current Outpatient Medications:  .  acetaminophen (TYLENOL) 500 MG tablet, Take 500 mg by mouth every 6 (six) hours as needed., Disp: , Rfl:  .  albuterol (PROVENTIL HFA;VENTOLIN HFA) 108 (90 Base) MCG/ACT inhaler, Inhale 2 puffs into the lungs every 6 (six) hours as needed for wheezing or shortness of breath., Disp: 1 Inhaler, Rfl: 3 .  cetirizine (ZYRTEC) 10 MG tablet, TAKE 1 TABLET BY MOUTH EVERY DAY, Disp: 30 tablet, Rfl: 11 .  fluticasone furoate-vilanterol (BREO ELLIPTA) 200-25 MCG/INH AEPB, Inhale 1 puff into the lungs daily., Disp: 180 each, Rfl: 2 .  glucose blood test strip, Use as instructed to test blood glucose daily, Disp: 100 each, Rfl: 12 .  ibuprofen (ADVIL,MOTRIN) 200 MG tablet, Take 600 mg by mouth every 6 (six) hours as needed for mild pain or moderate pain., Disp: , Rfl:  .  ipratropium-albuterol (DUONEB) 0.5-2.5 (3) MG/3ML SOLN, Take 3 mLs by nebulization every 4 (four) hours as  needed., Disp: 360 mL, Rfl: 1 .  lansoprazole (PREVACID) 15 MG capsule, Take 15 mg by mouth daily at 12 noon., Disp: , Rfl:  .  metFORMIN (GLUCOPHAGE) 500 MG tablet, Take 1 tablet (500 mg total) by mouth 2 (two) times daily with a meal., Disp: 180 tablet, Rfl: 3 .  mometasone (NASONEX) 50 MCG/ACT nasal spray, Place 2 sprays into the nose daily., Disp: 17 g, Rfl: 12 .  Multiple Vitamin (MULTIVITAMIN) tablet, Take 1 tablet by mouth daily., Disp: , Rfl:   Review of Systems  Constitutional: Negative.   Respiratory: Negative.   Cardiovascular: Negative.   Musculoskeletal:  Negative.     Social History   Tobacco Use  . Smoking status: Former Smoker    Quit date: 10/21/1983    Years since quitting: 35.6  . Smokeless tobacco: Never Used  Substance Use Topics  . Alcohol use: No      Objective:   BP (!) 152/87 (BP Location: Right Arm, Patient Position: Sitting, Cuff Size: Large)   Pulse 73   Temp 98.1 F (36.7 C) (Oral)   Wt 245 lb 3.2 oz (111.2 kg)   SpO2 99%   BMI 42.09 kg/m  Vitals:   05/26/19 0840  BP: (!) 152/87  Pulse: 73  Temp: 98.1 F (36.7 C)  TempSrc: Oral  SpO2: 99%  Weight: 245 lb 3.2 oz (111.2 kg)     Physical Exam Vitals signs reviewed.  Constitutional:      General: She is not in acute distress.    Appearance: Normal appearance. She is well-developed. She is not diaphoretic.  HENT:     Head: Normocephalic and atraumatic.  Eyes:     General: No scleral icterus.    Conjunctiva/sclera: Conjunctivae normal.     Pupils: Pupils are equal, round, and reactive to light.  Neck:     Musculoskeletal: Neck supple.     Thyroid: No thyromegaly.  Cardiovascular:     Rate and Rhythm: Normal rate and regular rhythm.     Pulses: Normal pulses.     Heart sounds: Normal heart sounds. No murmur.  Pulmonary:     Effort: Pulmonary effort is normal. No respiratory distress.     Breath sounds: Normal breath sounds. No wheezing, rhonchi or rales.  Musculoskeletal:     Right lower leg: No edema.     Left lower leg: No edema.  Lymphadenopathy:     Cervical: No cervical adenopathy.  Skin:    General: Skin is warm and dry.     Capillary Refill: Capillary refill takes less than 2 seconds.     Findings: No rash.  Neurological:     Mental Status: She is alert and oriented to person, place, and time. Mental status is at baseline.  Psychiatric:        Mood and Affect: Mood normal.        Behavior: Behavior normal.    Diabetic Foot Exam - Simple   Simple Foot Form Diabetic Foot exam was performed with the following findings: Yes  05/26/2019  9:46 AM  Visual Inspection No deformities, no ulcerations, no other skin breakdown bilaterally: Yes Sensation Testing Intact to touch and monofilament testing bilaterally: Yes Pulse Check Posterior Tibialis and Dorsalis pulse intact bilaterally: Yes Comments     No results found for any visits on 05/26/19.     Assessment & Plan   Problem List Items Addressed This Visit      Respiratory   OSA (obstructive sleep apnea)  Endocrine   Diabetes ([place]) - Primary    Uncontrolled on last check  Did not tolerate metformin and stopped ~2 wks ago Discussed Metformin XR Recheck A1c Foot exam today Urine microalbumin today UTD on vaccines and eye exam Check lipid panel - likely needs statin      Relevant Medications   metFORMIN (GLUCOPHAGE XR) 500 MG 24 hr tablet   Other Relevant Orders   Hemoglobin A1c   Lipid panel   POCT UA - Microalbumin (Completed)   Hyperlipidemia associated with type 2 diabetes mellitus ([place])    Discussed LDL goal of <70 Not currently on statin Recheck CMP and FLP      Relevant Medications   metFORMIN (GLUCOPHAGE XR) 500 MG 24 hr tablet   Other Relevant Orders   Lipid panel   Comprehensive metabolic panel     Other   Morbid obesity ([place])    Discussed importance of healthy weight management Discussed diet and exercise       Relevant Medications   metFORMIN (GLUCOPHAGE XR) 500 MG 24 hr tablet   Other Relevant Orders   Comprehensive metabolic panel    Other Visit Diagnoses    Screening for breast cancer       Relevant Orders   MM 3D SCREEN BREAST BILATERAL       Return in about 3 months (around 08/26/2019) for chronic disease f/u, CPE.   The entirety of the information documented in the History of Present Illness, Review of Systems and Physical Exam were personally obtained by me. Portions of this information were initially documented by Tiburcio Pea, CMA and reviewed by me for thoroughness and accuracy.    Alyx Mcguirk,  Dionne Bucy, MD MPH [facility]

## 2019-05-26 NOTE — Assessment & Plan Note (Signed)
Discussed LDL goal of <70 Not currently on statin Recheck CMP and FLP

## 2019-05-27 ENCOUNTER — Telehealth: Payer: Self-pay

## 2019-05-27 LAB — COMPREHENSIVE METABOLIC PANEL
ALT: 22 IU/L (ref 0–32)
AST: 14 IU/L (ref 0–40)
Albumin/Globulin Ratio: 1.6 (ref 1.2–2.2)
Albumin: 4 g/dL (ref 3.8–4.9)
Alkaline Phosphatase: 110 IU/L (ref 39–117)
BUN/Creatinine Ratio: 10 (ref 9–23)
BUN: 8 mg/dL (ref 6–24)
Bilirubin Total: 0.4 mg/dL (ref 0.0–1.2)
CO2: 22 mmol/L (ref 20–29)
Calcium: 9.5 mg/dL (ref 8.7–10.2)
Chloride: 102 mmol/L (ref 96–106)
Creatinine, Ser: 0.83 mg/dL (ref 0.57–1.00)
GFR calc Af Amer: 92 mL/min/{1.73_m2} (ref >59)
GFR calc non Af Amer: 80 mL/min/{1.73_m2} (ref >59)
Globulin, Total: 2.5 g/dL (ref 1.5–4.5)
Glucose: 169 mg/dL — ABNORMAL HIGH (ref 65–99)
Potassium: 4.3 mmol/L (ref 3.5–5.2)
Sodium: 139 mmol/L (ref 134–144)
Total Protein: 6.5 g/dL (ref 6.0–8.5)

## 2019-05-27 LAB — LIPID PANEL
Chol/HDL Ratio: 3.7 ratio (ref 0.0–4.4)
Cholesterol, Total: 167 mg/dL (ref 100–199)
HDL: 45 mg/dL (ref >39)
LDL Calculated: 99 mg/dL (ref 0–99)
Triglycerides: 114 mg/dL (ref 0–149)
VLDL Cholesterol Cal: 23 mg/dL (ref 5–40)

## 2019-05-27 LAB — HEMOGLOBIN A1C
Est. average glucose Bld gHb Est-mCnc: 183 mg/dL
Hgb A1c MFr Bld: 8 % — ABNORMAL HIGH (ref 4.8–5.6)

## 2019-05-27 MED ORDER — ATORVASTATIN CALCIUM 10 MG PO TABS: 10.0000 mg | ORAL_TABLET | Freq: Every day | ORAL | 3 refills | Status: DC

## 2019-05-27 NOTE — Telephone Encounter (Signed)
-----   Message from Virginia Crews, MD sent at 05/27/2019  1:35 PM EDT ----- Normal labs, except A1c has increased further.  Definitely needs to take Metformin XL as we discussed.  Cholesterol not at goal for diabetes.  I'd recommend a statin to decrease heart disease/stroke risk.

## 2019-05-27 NOTE — Telephone Encounter (Signed)
Patient was advised and states that the Metformin XL & statin can be send to [place].

## 2019-05-27 NOTE — Telephone Encounter (Signed)
Patient was advised.  

## 2019-05-27 NOTE — Telephone Encounter (Signed)
Metformin XR and Atorvastatin sent ot pharmacy.  Let us know if she has any side effects. Otherwsie f/u in 3 months and labs will be repeated at that time

## 2019-06-03 ENCOUNTER — Telehealth: Payer: Self-pay | Admitting: Family Medicine

## 2019-06-03 NOTE — Telephone Encounter (Signed)
This is unlikely to be a side effect of either of these medications, but not impossible.  Can she take a picture of the rash and send it to me through Mychart?

## 2019-06-03 NOTE — Telephone Encounter (Signed)
Pt was in last week and Dr. B put her on Atorvastatin and changed the Metformin to an extended release formula.  She has gotten a little rash around her ankle and wrist,  She wonders if this is coming from her medications.  CB#  [phone]  teri

## 2019-06-04 ENCOUNTER — Encounter: Payer: Self-pay | Admitting: Family Medicine

## 2019-06-04 NOTE — Telephone Encounter (Signed)
Patient advised. She will send a picture through My Chart.

## 2019-07-15 ENCOUNTER — Ambulatory Visit: Payer: Managed Care, Other (non HMO) | Admitting: Family Medicine

## 2019-08-02 LAB — HM DIABETES EYE EXAM

## 2019-09-02 ENCOUNTER — Ambulatory Visit (INDEPENDENT_AMBULATORY_CARE_PROVIDER_SITE_OTHER): Payer: Managed Care, Other (non HMO) | Admitting: Family Medicine

## 2019-09-02 ENCOUNTER — Encounter: Payer: Self-pay | Admitting: Family Medicine

## 2019-09-02 ENCOUNTER — Other Ambulatory Visit: Payer: Self-pay

## 2019-09-02 VITALS — BP 130/76 | HR 80 | Temp 96.2°F | Ht 64.0 in | Wt 243.0 lb

## 2019-09-02 DIAGNOSIS — E1169 Type 2 diabetes mellitus with other specified complication: Secondary | ICD-10-CM

## 2019-09-02 DIAGNOSIS — Z0000 Encounter for general adult medical examination without abnormal findings: Secondary | ICD-10-CM

## 2019-09-02 DIAGNOSIS — E785 Hyperlipidemia, unspecified: Secondary | ICD-10-CM

## 2019-09-02 DIAGNOSIS — J4540 Moderate persistent asthma, uncomplicated: Secondary | ICD-10-CM | POA: Diagnosis not present

## 2019-09-02 DIAGNOSIS — Z23 Encounter for immunization: Secondary | ICD-10-CM

## 2019-09-02 NOTE — Patient Instructions (Signed)

## 2019-09-02 NOTE — Assessment & Plan Note (Signed)
Previously uncontrolled Associated with OSA, GERD, HLD Did not tolerate metformin due to diarrhea Doing well on Metformin XR Recheck A1c UTD on vaccines and screenings On statin F/u in 3 months

## 2019-09-02 NOTE — Progress Notes (Signed)
Patient: Nancy Blake, Female    DOB: 12-31-1965, 53 y.o.   MRN: [id number] Visit Date: 09/02/2019  Today's Provider: Shirlee Latch, MD   Chief Complaint  Patient presents with  . Annual Exam  . Hyperlipidemia  . Diabetes   Subjective:     Annual physical exam Nancy Blake is a 53 y.o. female who presents today for health maintenance and complete physical. She feels well. She reports exercising some. She reports she is sleeping well.  -----------------------------------------------------------------   Review of Systems  Constitutional: Negative.   HENT: Negative.   Eyes: Negative.   Respiratory: Negative.   Cardiovascular: Negative.   Gastrointestinal: Negative.   Endocrine: Negative.   Genitourinary: Negative.   Musculoskeletal: Negative.   Skin: Negative.   Allergic/Immunologic: Negative.   Neurological: Negative.   Hematological: Negative.   Psychiatric/Behavioral: Negative.     Social History      She  reports that she quit smoking about 35 years ago. She has never used smokeless tobacco. She reports that she does not drink alcohol or use drugs.       Social History   Socioeconomic History  . Marital status: Married    Spouse name: Not on file  . Number of children: Not on file  . Years of education: Not on file  . Highest education level: Not on file  Occupational History  . Not on file  Social Needs  . Financial resource strain: Not on file  . Food insecurity    Worry: Not on file    Inability: Not on file  . Transportation needs    Medical: Not on file    Non-medical: Not on file  Tobacco Use  . Smoking status: Former Smoker    Quit date: 10/21/1983    Years since quitting: 35.8  . Smokeless tobacco: Never Used  Substance and Sexual Activity  . Alcohol use: No  . Drug use: No  . Sexual activity: Not on file  Lifestyle  . Physical activity    Days per week: Not on file    Minutes per session: Not on file  .  Stress: Not on file  Relationships  . Social Musician on phone: Not on file    Gets together: Not on file    Attends religious service: Not on file    Active member of club or organization: Not on file    Attends meetings of clubs or organizations: Not on file    Relationship status: Not on file  Other Topics Concern  . Not on file  Social History Narrative  . Not on file    Past Medical History:  Diagnosis Date  . Allergic rhinitis   . Asthma   . CPAP (continuous positive airway pressure) dependence   . Diabetes mellitus without complication (HCC)   . Pneumonia   . Sleep apnea      Patient Active Problem List   Diagnosis Date Noted  . OSA (obstructive sleep apnea) 12/03/2018  . Morbid obesity (HCC) 12/03/2018  . Hyperlipidemia associated with type 2 diabetes mellitus (HCC) 12/03/2018  . Menopausal symptoms 04/13/2018  . Chronic bilateral low back pain without sciatica 04/13/2018  . Rectal polyp   . Diabetes (HCC) 12/08/2015  . Asthma 10/14/2015  . Allergic rhinitis 04/01/2009  . Acid reflux 03/31/2009    Past Surgical History:  Procedure Laterality Date  . ABDOMINAL HYSTERECTOMY    . APPENDECTOMY    .  COLONOSCOPY WITH PROPOFOL N/A 03/19/2016   Procedure: COLONOSCOPY WITH PROPOFOL;  Surgeon: Midge Minium, MD;  Location: [facility] ENDOSCOPY;  Service: Endoscopy;  Laterality: N/A;  . TUBAL LIGATION  1995    Family History        Family Status  Relation Name Status  . Mother  Deceased  . Father  Alive  . Brother  Alive  . MGM  Deceased  . MGF  Deceased  . PGF  Deceased  . Neg Hx  (Not Specified)        Her family history includes CAD in her mother; COPD in her maternal grandfather; Dementia in her mother; Healthy in her brother and father; Lung cancer in her paternal grandfather; Osteoporosis in her maternal grandmother. There is no history of Breast cancer.      No Known Allergies   Current Outpatient Medications:  .  acetaminophen (TYLENOL) 500  MG tablet, Take 500 mg by mouth every 6 (six) hours as needed., Disp: , Rfl:  .  albuterol (PROVENTIL HFA;VENTOLIN HFA) 108 (90 Base) MCG/ACT inhaler, Inhale 2 puffs into the lungs every 6 (six) hours as needed for wheezing or shortness of breath., Disp: 1 Inhaler, Rfl: 3 .  atorvastatin (LIPITOR) 10 MG tablet, Take 1 tablet (10 mg total) by mouth daily., Disp: 90 tablet, Rfl: 3 .  cetirizine (ZYRTEC) 10 MG tablet, TAKE 1 TABLET BY MOUTH EVERY DAY, Disp: 30 tablet, Rfl: 11 .  glucose blood test strip, Use as instructed to test blood glucose daily, Disp: 100 each, Rfl: 12 .  ibuprofen (ADVIL,MOTRIN) 200 MG tablet, Take 600 mg by mouth every 6 (six) hours as needed for mild pain or moderate pain., Disp: , Rfl:  .  ipratropium-albuterol (DUONEB) 0.5-2.5 (3) MG/3ML SOLN, Take 3 mLs by nebulization every 4 (four) hours as needed., Disp: 360 mL, Rfl: 1 .  lansoprazole (PREVACID) 15 MG capsule, Take 15 mg by mouth daily at 12 noon., Disp: , Rfl:  .  metFORMIN (GLUCOPHAGE XR) 500 MG 24 hr tablet, Take 1 tablet (500 mg total) by mouth daily with breakfast., Disp: 30 tablet, Rfl: 3 .  mometasone (NASONEX) 50 MCG/ACT nasal spray, Place 2 sprays into the nose daily., Disp: 17 g, Rfl: 12 .  Multiple Vitamin (MULTIVITAMIN) tablet, Take 1 tablet by mouth daily., Disp: , Rfl:  .  omeprazole (PRILOSEC) 20 MG capsule, Take 20 mg by mouth daily., Disp: , Rfl:  .  fluticasone furoate-vilanterol (BREO ELLIPTA) 200-25 MCG/INH AEPB, Inhale 1 puff into the lungs daily. (Patient not taking: Reported on 09/02/2019), Disp: 180 each, Rfl: 2 .  ofloxacin (OCUFLOX) 0.3 % ophthalmic solution, Place 1 drop into the right eye 4 (four) times daily., Disp: , Rfl:  .  prednisoLONE acetate (PRED FORTE) 1 % ophthalmic suspension, Place 1 drop into the right eye 5 (five) times daily., Disp: , Rfl:    Patient Care Team: Erasmo Downer, MD as PCP - General (Family Medicine)    Objective:    Vitals: BP 130/76 (BP Location: Left  Arm, Patient Position: Sitting, Cuff Size: Large)   Pulse 80   Temp (!) 96.2 F (35.7 C) (Temporal)   Ht 5\' 4"  (1.626 m)   Wt 243 lb (110.2 kg)   BMI 41.71 kg/m    Vitals:   09/02/19 0954  BP: 130/76  Pulse: 80  Temp: (!) 96.2 F (35.7 C)  TempSrc: Temporal  Weight: 243 lb (110.2 kg)  Height: 5\' 4"  (1.626 m)     Physical  Exam Vitals signs reviewed.  Constitutional:      General: She is not in acute distress.    Appearance: Normal appearance. She is well-developed. She is not diaphoretic.  HENT:     Head: Normocephalic and atraumatic.     Right Ear: Tympanic membrane, ear canal and external ear normal.     Left Ear: Tympanic membrane, ear canal and external ear normal.  Eyes:     General: No scleral icterus.    Conjunctiva/sclera: Conjunctivae normal.     Pupils: Pupils are equal, round, and reactive to light.  Neck:     Musculoskeletal: Neck supple.     Thyroid: No thyromegaly.  Cardiovascular:     Rate and Rhythm: Normal rate and regular rhythm.     Pulses: Normal pulses.     Heart sounds: Normal heart sounds. No murmur.  Pulmonary:     Effort: Pulmonary effort is normal. No respiratory distress.     Breath sounds: Normal breath sounds. No wheezing or rales.  Abdominal:     General: There is no distension.     Palpations: Abdomen is soft.     Tenderness: There is no abdominal tenderness.  Genitourinary:    Comments: Breasts: breasts appear normal, no suspicious masses, no skin or nipple changes or axillary nodes.  Musculoskeletal:        General: No deformity.     Right lower leg: No edema.     Left lower leg: No edema.  Lymphadenopathy:     Cervical: No cervical adenopathy.  Skin:    General: Skin is warm and dry.     Capillary Refill: Capillary refill takes less than 2 seconds.     Findings: No rash.  Neurological:     Mental Status: She is alert and oriented to person, place, and time. Mental status is at baseline.  Psychiatric:        Mood and  Affect: Mood normal.        Behavior: Behavior normal.        Thought Content: Thought content normal.      Depression Screen PHQ 2/9 Scores 09/02/2019 02/24/2019 04/13/2018 02/04/2017  PHQ - 2 Score 0 0 0 0  PHQ- 9 Score 0 - 1 0       Assessment & Plan:     Routine Health Maintenance and Physical Exam  Exercise Activities and Dietary recommendations Goals   None     Immunization History  Administered Date(s) Administered  . Influenza Split 08/22/2015, 09/10/2017  . Influenza,inj,Quad PF,6+ Mos 09/10/2017, 08/19/2018  . Pneumococcal Polysaccharide-23 12/08/2015  . Tdap 04/13/2018    Health Maintenance  Topic Date Due  . HIV Screening  03/31/1980  . INFLUENZA VACCINE  05/22/2019  . HEMOGLOBIN A1C  11/26/2019  . FOOT EXAM  05/25/2020  . URINE MICROALBUMIN  05/25/2020  . MAMMOGRAM  06/16/2020  . OPHTHALMOLOGY EXAM  08/01/2020  . PAP SMEAR-Modifier  11/08/2020  . COLONOSCOPY  03/19/2026  . TETANUS/TDAP  04/13/2028  . PNEUMOCOCCAL POLYSACCHARIDE VACCINE AGE 10-64 HIGH RISK  Completed     Discussed health benefits of physical activity, and encouraged her to engage in regular exercise appropriate for her age and condition.    -------------------------------------------------------------------- Problem List Items Addressed This Visit      Respiratory   Asthma    Followed by Pulmonology Well controlled No longer on Breo        Endocrine   Diabetes [facility](HCC)    Previously uncontrolled Associated with OSA, GERD, HLD  Did not tolerate metformin due to diarrhea Doing well on Metformin XR Recheck A1c UTD on vaccines and screenings On statin F/u in 3 months      Relevant Orders   Comprehensive Metabolic Panel (CMET)   HgB A1c   Hyperlipidemia associated with type 2 diabetes mellitus ([place])    Discussed LDL goal <70 Recently started statin - tolerating well - continue Recheck Lipid panel and CMP      Relevant Orders   Comprehensive Metabolic Panel (CMET)    Lipid Profile     Other   Morbid obesity ([place])    Discussed importance of healthy weight management Discussed diet and exercise        Other Visit Diagnoses    Encounter for annual physical exam    -  Primary   Need for influenza vaccination       Relevant Orders   Flu Vaccine QUAD 6+ mos PF IM (Fluarix Quad PF) (Completed)       Return in about 3 months (around 12/03/2019) for chronic disease f/u.   The entirety of the information documented in the History of Present Illness, Review of Systems and Physical Exam were personally obtained by me. Portions of this information were initially documented by Ashley Royalty, CMA and reviewed by me for thoroughness and accuracy.    , Dionne Bucy, MD MPH [facility]

## 2019-09-02 NOTE — Assessment & Plan Note (Signed)
Followed by Pulmonology Well controlled No longer on [facility]

## 2019-09-02 NOTE — Assessment & Plan Note (Signed)
Discussed importance of healthy weight management Discussed diet and exercise  

## 2019-09-02 NOTE — Assessment & Plan Note (Signed)
Discussed LDL goal <70 Recently started statin - tolerating well - continue Recheck Lipid panel and CMP

## 2019-09-03 ENCOUNTER — Telehealth: Payer: Self-pay

## 2019-09-03 LAB — COMPREHENSIVE METABOLIC PANEL
ALT: 26 IU/L (ref 0–32)
AST: 23 IU/L (ref 0–40)
Albumin/Globulin Ratio: 1.6 (ref 1.2–2.2)
Albumin: 4.1 g/dL (ref 3.8–4.9)
Alkaline Phosphatase: 106 IU/L (ref 39–117)
BUN/Creatinine Ratio: 11 (ref 9–23)
BUN: 9 mg/dL (ref 6–24)
Bilirubin Total: 0.4 mg/dL (ref 0.0–1.2)
CO2: 19 mmol/L — ABNORMAL LOW (ref 20–29)
Calcium: 9.5 mg/dL (ref 8.7–10.2)
Chloride: 102 mmol/L (ref 96–106)
Creatinine, Ser: 0.82 mg/dL (ref 0.57–1.00)
GFR calc Af Amer: 94 mL/min/{1.73_m2} (ref >59)
GFR calc non Af Amer: 81 mL/min/{1.73_m2} (ref >59)
Globulin, Total: 2.5 g/dL (ref 1.5–4.5)
Glucose: 141 mg/dL — ABNORMAL HIGH (ref 65–99)
Potassium: 4.3 mmol/L (ref 3.5–5.2)
Sodium: 139 mmol/L (ref 134–144)
Total Protein: 6.6 g/dL (ref 6.0–8.5)

## 2019-09-03 LAB — HEMOGLOBIN A1C
Est. average glucose Bld gHb Est-mCnc: 212 mg/dL
Hgb A1c MFr Bld: 9 % — ABNORMAL HIGH (ref 4.8–5.6)

## 2019-09-03 LAB — LIPID PANEL
Chol/HDL Ratio: 2.6 ratio (ref 0.0–4.4)
Cholesterol, Total: 122 mg/dL (ref 100–199)
HDL: 47 mg/dL (ref >39)
LDL Chol Calc (NIH): 54 mg/dL (ref 0–99)
Triglycerides: 116 mg/dL (ref 0–149)
VLDL Cholesterol Cal: 21 mg/dL (ref 5–40)

## 2019-09-03 MED ORDER — METFORMIN HCL ER 500 MG PO TB24: 1000.0000 mg | ORAL_TABLET | Freq: Every day | ORAL | 1 refills | Status: DC

## 2019-09-03 NOTE — Telephone Encounter (Signed)
Pt advised.  RX sent to [facility]. ? ?Thanks,  ? ?-Jheremy Boger  ?

## 2019-09-03 NOTE — Telephone Encounter (Signed)
-----   Message from Angela M Bacigalupo, MD sent at 09/03/2019 10:32 AM EST ----- Normal kidney function, liver function, electrolytes.  Cholesterol has improved.  A1c has worsened again. Increase Metformin XR to 1000mg daily.  May need to add another medication at f/u if not improving. F/u in 3 months 

## 2019-09-03 NOTE — Telephone Encounter (Signed)
-----   Message from Virginia Crews, MD sent at 09/03/2019 10:32 AM EST ----- Normal kidney function, liver function, electrolytes.  Cholesterol has improved.  A1c has worsened again. Increase Metformin XR to 1000mg  daily.  May need to add another medication at f/u if not improving. F/u in 3 months

## 2019-09-07 ENCOUNTER — Other Ambulatory Visit: Payer: Self-pay | Admitting: Internal Medicine

## 2019-09-22 ENCOUNTER — Telehealth: Payer: Self-pay | Admitting: Internal Medicine

## 2019-09-22 MED ORDER — ALBUTEROL SULFATE HFA 108 (90 BASE) MCG/ACT IN AERS: 2.0000 | INHALATION_SPRAY | Freq: Four times a day (QID) | RESPIRATORY_TRACT | 3 refills | Status: DC | PRN

## 2019-09-22 NOTE — Telephone Encounter (Signed)
Rx for Ventolin has been sent to preferred pharmacy.  Pt is aware and voiced her understanding. Nothing further is needed.  

## 2019-12-06 ENCOUNTER — Ambulatory Visit: Payer: Managed Care, Other (non HMO) | Admitting: Family Medicine

## 2019-12-06 ENCOUNTER — Encounter: Payer: Self-pay | Admitting: Family Medicine

## 2019-12-06 ENCOUNTER — Other Ambulatory Visit: Payer: Self-pay

## 2019-12-06 VITALS — BP 135/79 | HR 82 | Temp 96.8°F | Resp 16 | Ht 64.0 in | Wt 240.0 lb

## 2019-12-06 DIAGNOSIS — E785 Hyperlipidemia, unspecified: Secondary | ICD-10-CM | POA: Diagnosis not present

## 2019-12-06 DIAGNOSIS — E1169 Type 2 diabetes mellitus with other specified complication: Secondary | ICD-10-CM | POA: Diagnosis not present

## 2019-12-06 LAB — POCT GLYCOSYLATED HEMOGLOBIN (HGB A1C)
Est. average glucose Bld gHb Est-mCnc: 163
Hemoglobin A1C: 7.3 % — AB (ref 4.0–5.6)

## 2019-12-06 MED ORDER — METFORMIN HCL ER 500 MG PO TB24: ORAL_TABLET | ORAL | 1 refills | Status: DC

## 2019-12-06 NOTE — Assessment & Plan Note (Signed)
Uncontrolled, but improving Associated with OSA, GERD, HLD Did not tolerate Metformin due to diarrhea Doing well on Metformin XR - increase to 1500mg  daily UTD on vaccines and screenings On statin F/u in 1m

## 2019-12-06 NOTE — Patient Instructions (Signed)

## 2019-12-06 NOTE — Assessment & Plan Note (Signed)
Well controlled on recent lipid panel with LDL <70 Continue statin at current dose

## 2019-12-06 NOTE — Progress Notes (Signed)
Patient: Nancy Blake Female    DOB: Dec 25, 1965   54 y.o.   MRN: [id number] Visit Date: 12/06/2019  Today's Provider: Shirlee Latch, MD   Chief Complaint  Patient presents with  . Diabetes   Subjective:    I [place] S. Dimas, CMA, am acting as scribe for Shirlee Latch, MD.   HPI  Diabetes Mellitus Type II, Follow-up:   Lab Results  Component Value Date   HGBA1C 7.3 (A) 12/06/2019   HGBA1C 9.0 (H) 09/02/2019   HGBA1C 8.0 (H) 05/26/2019    Last seen for diabetes 3 months ago.  Management since then includes no changes. She reports excellent compliance with treatment. She is not having side effects.  Current symptoms include none and have been stable. Home blood sugar records: fasting range: 145-150  Episodes of hypoglycemia? no   Current insulin regiment: Is not on insulin Most Recent Eye Exam: UTD Weight trend: stable Prior visit with dietician: No Current exercise: walking Current diet habits: not asked  Pertinent Labs:    Component Value Date/Time   CHOL 122 09/02/2019 1037   TRIG 116 09/02/2019 1037   HDL 47 09/02/2019 1037   LDLCALC 54 09/02/2019 1037   CREATININE 0.82 09/02/2019 1037    Wt Readings from Last 3 Encounters:  12/06/19 240 lb (108.9 kg)  09/02/19 243 lb (110.2 kg)  05/26/19 245 lb 3.2 oz (111.2 kg)    ------------------------------------------------------------------------  No Known Allergies   Current Outpatient Medications:  .  acetaminophen (TYLENOL) 500 MG tablet, Take 500 mg by mouth every 6 (six) hours as needed., Disp: , Rfl:  .  albuterol (VENTOLIN HFA) 108 (90 Base) MCG/ACT inhaler, Inhale 2 puffs into the lungs every 6 (six) hours as needed for wheezing or shortness of breath., Disp: 18 g, Rfl: 3 .  atorvastatin (LIPITOR) 10 MG tablet, Take 1 tablet (10 mg total) by mouth daily., Disp: 90 tablet, Rfl: 3 .  cetirizine (ZYRTEC) 10 MG tablet, TAKE 1 TABLET BY MOUTH EVERY DAY, Disp: 30 tablet, Rfl: 11 .   cholecalciferol (VITAMIN D3) 25 MCG (1000 UNIT) tablet, Take 1,000 Units by mouth daily., Disp: , Rfl:  .  glucose blood test strip, Use as instructed to test blood glucose daily, Disp: 100 each, Rfl: 12 .  ibuprofen (ADVIL,MOTRIN) 200 MG tablet, Take 600 mg by mouth every 6 (six) hours as needed for mild pain or moderate pain., Disp: , Rfl:  .  ipratropium-albuterol (DUONEB) 0.5-2.5 (3) MG/3ML SOLN, Take 3 mLs by nebulization every 4 (four) hours as needed., Disp: 360 mL, Rfl: 1 .  metFORMIN (GLUCOPHAGE XR) 500 MG 24 hr tablet, Take 2 tablets (1,000 mg total) by mouth daily with breakfast., Disp: 180 tablet, Rfl: 1 .  Multiple Vitamin (MULTIVITAMIN) tablet, Take 1 tablet by mouth daily., Disp: , Rfl:  .  omeprazole (PRILOSEC) 20 MG capsule, Take 20 mg by mouth daily., Disp: , Rfl:   Review of Systems  Constitutional: Negative.   Eyes: Negative.   Respiratory: Negative.   Cardiovascular: Negative.   Gastrointestinal: Negative.   Endocrine: Negative.     Social History   Tobacco Use  . Smoking status: Former Smoker    Quit date: 10/21/1983    Years since quitting: 36.1  . Smokeless tobacco: Never Used  Substance Use Topics  . Alcohol use: No      Objective:   BP 135/79 (BP Location: Left Arm, Patient Position: Sitting, Cuff Size: Large)   Pulse 82  Temp (!) 96.8 F (36 C) (Temporal)   Resp 16   Ht 5\' 4"  (1.626 m)   Wt 240 lb (108.9 kg)   BMI 41.20 kg/m  Vitals:   12/06/19 0902  BP: 135/79  Pulse: 82  Resp: 16  Temp: (!) 96.8 F (36 C)  TempSrc: Temporal  Weight: 240 lb (108.9 kg)  Height: 5\' 4"  (1.626 m)  Body mass index is 41.2 kg/m.   Physical Exam Vitals reviewed.  Constitutional:      General: She is not in acute distress.    Appearance: Normal appearance. She is well-developed. She is not diaphoretic.  HENT:     Head: Normocephalic and atraumatic.  Eyes:     General: No scleral icterus.    Conjunctiva/sclera: Conjunctivae normal.  Neck:      Thyroid: No thyromegaly.  Cardiovascular:     Rate and Rhythm: Normal rate and regular rhythm.     Pulses: Normal pulses.     Heart sounds: Normal heart sounds. No murmur.  Pulmonary:     Effort: Pulmonary effort is normal. No respiratory distress.     Breath sounds: Normal breath sounds. No wheezing, rhonchi or rales.  Musculoskeletal:     Cervical back: Neck supple.     Right lower leg: No edema.     Left lower leg: No edema.  Lymphadenopathy:     Cervical: No cervical adenopathy.  Skin:    General: Skin is warm and dry.     Capillary Refill: Capillary refill takes less than 2 seconds.     Findings: No rash.  Neurological:     Mental Status: She is alert and oriented to person, place, and time. Mental status is at baseline.  Psychiatric:        Mood and Affect: Mood normal.        Behavior: Behavior normal.      Results for orders placed or performed in visit on 12/06/19  POCT glycosylated hemoglobin (Hb A1C)  Result Value Ref Range   Hemoglobin A1C 7.3 (A) 4.0 - 5.6 %   Est. average glucose Bld gHb Est-mCnc 163        Assessment & Plan    Problem List Items Addressed This Visit      Endocrine   Diabetes ([place]) - Primary    Uncontrolled, but improving Associated with OSA, GERD, HLD Did not tolerate Metformin due to diarrhea Doing well on Metformin XR - increase to 1500mg  daily UTD on vaccines and screenings On statin F/u in 1m       Relevant Medications   metFORMIN (GLUCOPHAGE XR) 500 MG 24 hr tablet   Other Relevant Orders   POCT glycosylated hemoglobin (Hb A1C) (Completed)   Hyperlipidemia associated with type 2 diabetes mellitus (HCC)    Well controlled on recent lipid panel with LDL <70 Continue statin at current dose      Relevant Medications   metFORMIN (GLUCOPHAGE XR) 500 MG 24 hr tablet     Other   Morbid obesity ([place])    Discussed importance of healthy weight management Discussed diet and exercise       Relevant Medications   metFORMIN  (GLUCOPHAGE XR) 500 MG 24 hr tablet       Return in about 3 months (around 03/04/2020) for chronic disease f/u.   The entirety of the information documented in the History of Present Illness, Review of Systems and Physical Exam were personally obtained by me. Portions of this information were initially documented by  Rondel Baton, CMA and reviewed by me for thoroughness and accuracy.    Nahsir Venezia, Marzella Schlein, MD MPH [facility]

## 2019-12-06 NOTE — Assessment & Plan Note (Signed)
Discussed importance of healthy weight management Discussed diet and exercise  

## 2020-01-03 ENCOUNTER — Other Ambulatory Visit: Payer: Self-pay

## 2020-01-03 MED ORDER — ALBUTEROL SULFATE HFA 108 (90 BASE) MCG/ACT IN AERS: 2.0000 | INHALATION_SPRAY | Freq: Four times a day (QID) | RESPIRATORY_TRACT | 0 refills | Status: DC | PRN

## 2020-03-02 ENCOUNTER — Other Ambulatory Visit: Payer: Self-pay

## 2020-03-02 MED ORDER — METFORMIN HCL ER 500 MG PO TB24: ORAL_TABLET | ORAL | 1 refills | Status: DC

## 2020-03-06 ENCOUNTER — Ambulatory Visit: Payer: Self-pay | Admitting: Family Medicine

## 2020-03-21 ENCOUNTER — Ambulatory Visit: Payer: Managed Care, Other (non HMO) | Admitting: Family Medicine

## 2020-04-07 NOTE — Progress Notes (Signed)
I,Laura E Walsh,acting as a scribe for Shirlee Latch, MD.,have documented all relevant documentation on the behalf of Shirlee Latch, MD,as directed by  Shirlee Latch, MD while in the presence of Shirlee Latch, MD.    Established patient visit   Patient: Nancy Blake   DOB: September 22, 1966   54 y.o. Female  MRN: [id number] Visit Date: 04/10/2020  Today's healthcare provider: Shirlee Latch, MD   Chief Complaint  Patient presents with  . Diabetes  . Hyperlipidemia   Subjective    HPI Diabetes Mellitus Type II, follow-up  Lab Results  Component Value Date   HGBA1C 7.5 (A) 04/10/2020   HGBA1C 7.3 (A) 12/06/2019   HGBA1C 9.0 (H) 09/02/2019   Last seen for diabetes 3 months ago.  Management since then includes increasing Metformin XR 1500mg  a day She reports excellent compliance with treatment. She is having side effects. Pt states she had side effects to the increase of Metformin but has since improved.  Home blood sugar records: Blood Sugars are not checked at home often, but today it was 116.   Episodes of hypoglycemia? No    Current insulin regiment: NONE Most Recent Eye Exam: UTD  Lipid/Cholesterol, follow-up  Last Lipid Panel: Lab Results  Component Value Date   CHOL 122 09/02/2019   LDLCALC 54 09/02/2019   HDL 47 09/02/2019   TRIG 116 09/02/2019    She was last seen for this 3 months ago.  Management since that visit includes no changes.  She reports excellent compliance with treatment. She is not having side effects.   Symptoms: No appetite changes No foot ulcerations  No chest pain No chest pressure/discomfort  No dyspnea No orthopnea  No fatigue No lower extremity edema  No palpitations No paroxysmal nocturnal dyspnea  No nausea No numbness or tingling of extremity  No polydipsia No polyuria  No speech difficulty No syncope   She is following a Diabetic diet. Current exercise: Exercising some.  Last metabolic panel Lab  Results  Component Value Date   GLUCOSE 141 (H) 09/02/2019   NA 139 09/02/2019   K 4.3 09/02/2019   BUN 9 09/02/2019   CREATININE 0.82 09/02/2019   GFRNONAA 81 09/02/2019   GFRAA 94 09/02/2019   CALCIUM 9.5 09/02/2019   AST 23 09/02/2019   ALT 26 09/02/2019   The ASCVD Risk score (Goff DC Jr., et al., 2013) failed to calculate for the following reasons:   The valid total cholesterol range is 130 to 320 mg/dL  ---------------------------------------------------------------------------------------------------   Patient Active Problem List   Diagnosis Date Noted  . Vitamin D deficiency 04/10/2020  . Long-term use of high-risk medication 04/10/2020  . OSA (obstructive sleep apnea) 12/03/2018  . Morbid obesity (HCC) 12/03/2018  . Hyperlipidemia associated with type 2 diabetes mellitus (HCC) 12/03/2018  . Menopausal symptoms 04/13/2018  . Chronic bilateral low back pain without sciatica 04/13/2018  . Rectal polyp   . Diabetes (HCC) 12/08/2015  . Asthma 10/14/2015  . Allergic rhinitis 04/01/2009  . Acid reflux 03/31/2009   Social History   Tobacco Use  . Smoking status: Former Smoker    Quit date: 10/21/1983    Years since quitting: 36.4  . Smokeless tobacco: Never Used  Vaping Use  . Vaping Use: Never used  Substance Use Topics  . Alcohol use: No  . Drug use: No       Medications: Outpatient Medications Prior to Visit  Medication Sig  . acetaminophen (TYLENOL) 500 MG tablet Take 500  mg by mouth every 6 (six) hours as needed.  Marland Kitchen albuterol (VENTOLIN HFA) 108 (90 Base) MCG/ACT inhaler Inhale 2 puffs into the lungs every 6 (six) hours as needed for wheezing or shortness of breath.  Marland Kitchen atorvastatin (LIPITOR) 10 MG tablet Take 1 tablet (10 mg total) by mouth daily.  . cetirizine (ZYRTEC) 10 MG tablet TAKE 1 TABLET BY MOUTH EVERY DAY  . cholecalciferol (VITAMIN D3) 25 MCG (1000 UNIT) tablet Take 1,000 Units by mouth daily.  Marland Kitchen glucose blood test strip Use as instructed to  test blood glucose daily  . ibuprofen (ADVIL,MOTRIN) 200 MG tablet Take 600 mg by mouth every 6 (six) hours as needed for mild pain or moderate pain.  Marland Kitchen ipratropium-albuterol (DUONEB) 0.5-2.5 (3) MG/3ML SOLN Take 3 mLs by nebulization every 4 (four) hours as needed.  . metFORMIN (GLUCOPHAGE XR) 500 MG 24 hr tablet Take 2 tablets (1,000 mg total) by mouth daily with breakfast AND 1 tablet (500 mg total) every evening.  . Multiple Vitamin (MULTIVITAMIN) tablet Take 1 tablet by mouth daily.  Marland Kitchen omeprazole (PRILOSEC) 20 MG capsule Take 20 mg by mouth daily.   No facility-administered medications prior to visit.    Review of Systems  Constitutional: Negative.   Respiratory: Negative.   Cardiovascular: Negative.   Gastrointestinal: Negative.   Endocrine: Negative.   Skin: Negative for wound.  Neurological: Negative for dizziness, light-headedness, numbness and headaches.    Last lipids Lab Results  Component Value Date   CHOL 122 09/02/2019   HDL 47 09/02/2019   LDLCALC 54 09/02/2019   TRIG 116 09/02/2019   CHOLHDL 2.6 09/02/2019   Last hemoglobin A1c Lab Results  Component Value Date   HGBA1C 7.5 (A) 04/10/2020    Objective    BP 124/78 (BP Location: Left Arm, Patient Position: Sitting, Cuff Size: Large)   Pulse 83   Temp (!) 97.1 F (36.2 C) (Temporal)   Ht 5\' 4"  (1.626 m)   Wt 237 lb (107.5 kg)   SpO2 99%   BMI 40.68 kg/m  BP Readings from Last 3 Encounters:  04/10/20 124/78  12/06/19 135/79  09/02/19 130/76   Wt Readings from Last 3 Encounters:  04/10/20 237 lb (107.5 kg)  12/06/19 240 lb (108.9 kg)  09/02/19 243 lb (110.2 kg)    Waist Circumference  48.5 Inches   Physical Exam Vitals and nursing note reviewed.  Constitutional:      Appearance: Normal appearance.  Eyes:     Conjunctiva/sclera: Conjunctivae normal.  Cardiovascular:     Rate and Rhythm: Normal rate and regular rhythm.     Pulses: Normal pulses.     Heart sounds: Normal heart sounds.    Pulmonary:     Effort: Pulmonary effort is normal.     Breath sounds: Normal breath sounds.  Abdominal:     Tenderness: There is no abdominal tenderness.  Musculoskeletal:     Right lower leg: No edema.     Left lower leg: No edema.  Neurological:     Mental Status: She is alert and oriented to person, place, and time. Mental status is at baseline.  Psychiatric:        Mood and Affect: Mood normal.        Behavior: Behavior normal.        Thought Content: Thought content normal.        Judgment: Judgment normal.       Results for orders placed or performed in visit on 04/10/20  POCT  glycosylated hemoglobin (Hb A1C)  Result Value Ref Range   Hemoglobin A1C 7.5 (A) 4.0 - 5.6 %    Assessment & Plan     Problem List Items Addressed This Visit      Endocrine   Diabetes (HCC)    Hyperglycemia - A1c elevated at 7.5% has increased from 7.3 at last check.  Associated with HLD, GERD, OSA, morbid obesity Pt states she just started the increased dose of Metformin recently secondary to side effects.  She is taking the dose now without side effects. Consider second medication at follow up if not improved.  On statin UTD on screenings Recheck in three months.       Relevant Orders   POCT glycosylated hemoglobin (Hb A1C) (Completed)   Comprehensive metabolic panel   Amb ref to Medical Nutrition Therapy-MNT   Hyperlipidemia associated with type 2 diabetes mellitus (HCC) - Primary    Previously well controlled Continue statin Will get repeat FLP and CMP Goal LDL < 70       Relevant Orders   POCT glycosylated hemoglobin (Hb A1C) (Completed)   Comprehensive metabolic panel   Lipid panel     Other   Morbid obesity (HCC)    Discussed importance of healthy weight management Discussed diet and exercise Referral to Medical Nutrition Therapy.  Discussed mindful eating      Relevant Orders   Amb ref to Medical Nutrition Therapy-MNT   Vitamin D deficiency    Well check  Vitamin D today.       Relevant Orders   Vitamin D (25 hydroxy)   Long-term use of high-risk medication    Check B12 level as taking metformin and PPI chronically      Relevant Orders   Vitamin B12    Other Visit Diagnoses    Need for hepatitis C screening test       Relevant Orders   Hepatitis C Antibody     Declines HIV screening   Return in about 3 months (around 07/11/2020) for Diabetes follow up.      I, Shirlee Latch, MD, have reviewed all documentation for this visit. The documentation on 04/10/20 for the exam, diagnosis, procedures, and orders are all accurate and complete.   Lylla Eifler, Marzella Schlein, MD, MPH [facility]

## 2020-04-10 ENCOUNTER — Ambulatory Visit: Payer: Managed Care, Other (non HMO) | Admitting: Family Medicine

## 2020-04-10 ENCOUNTER — Encounter: Payer: Self-pay | Admitting: Family Medicine

## 2020-04-10 ENCOUNTER — Other Ambulatory Visit: Payer: Self-pay

## 2020-04-10 VITALS — BP 124/78 | HR 83 | Temp 97.1°F | Ht 64.0 in | Wt 237.0 lb

## 2020-04-10 DIAGNOSIS — Z79899 Other long term (current) drug therapy: Secondary | ICD-10-CM | POA: Insufficient documentation

## 2020-04-10 DIAGNOSIS — E785 Hyperlipidemia, unspecified: Secondary | ICD-10-CM

## 2020-04-10 DIAGNOSIS — E559 Vitamin D deficiency, unspecified: Secondary | ICD-10-CM | POA: Diagnosis not present

## 2020-04-10 DIAGNOSIS — Z1159 Encounter for screening for other viral diseases: Secondary | ICD-10-CM

## 2020-04-10 DIAGNOSIS — E1169 Type 2 diabetes mellitus with other specified complication: Secondary | ICD-10-CM

## 2020-04-10 LAB — POCT GLYCOSYLATED HEMOGLOBIN (HGB A1C): Hemoglobin A1C: 7.5 % — AB (ref 4.0–5.6)

## 2020-04-10 NOTE — Assessment & Plan Note (Addendum)
Discussed importance of healthy weight management Discussed diet and exercise Referral to Medical Nutrition Therapy.  Discussed mindful eating

## 2020-04-10 NOTE — Assessment & Plan Note (Signed)
Well check Vitamin D today.

## 2020-04-10 NOTE — Assessment & Plan Note (Signed)
Check B12 level as taking metformin and PPI chronically

## 2020-04-10 NOTE — Assessment & Plan Note (Addendum)
Previously well controlled Continue statin Will get repeat FLP and CMP Goal LDL < 70

## 2020-04-10 NOTE — Assessment & Plan Note (Addendum)
Hyperglycemia - A1c elevated at 7.5% has increased from 7.3 at last check.  Associated with HLD, GERD, OSA, morbid obesity Pt states she just started the increased dose of Metformin recently secondary to side effects.  She is taking the dose now without side effects. Consider second medication at follow up if not improved.  On statin UTD on screenings Recheck in three months.

## 2020-04-10 NOTE — Patient Instructions (Signed)

## 2020-04-15 LAB — COMPREHENSIVE METABOLIC PANEL
ALT: 21 IU/L (ref 0–32)
AST: 15 IU/L (ref 0–40)
Albumin/Globulin Ratio: 1.7 (ref 1.2–2.2)
Albumin: 3.9 g/dL (ref 3.8–4.9)
Alkaline Phosphatase: 96 IU/L (ref 48–121)
BUN/Creatinine Ratio: 14 (ref 9–23)
BUN: 12 mg/dL (ref 6–24)
Bilirubin Total: 0.4 mg/dL (ref 0.0–1.2)
CO2: 25 mmol/L (ref 20–29)
Calcium: 9 mg/dL (ref 8.7–10.2)
Chloride: 104 mmol/L (ref 96–106)
Creatinine, Ser: 0.86 mg/dL (ref 0.57–1.00)
GFR calc Af Amer: 88 mL/min/{1.73_m2} (ref >59)
GFR calc non Af Amer: 76 mL/min/{1.73_m2} (ref >59)
Globulin, Total: 2.3 g/dL (ref 1.5–4.5)
Glucose: 159 mg/dL — ABNORMAL HIGH (ref 65–99)
Potassium: 4.5 mmol/L (ref 3.5–5.2)
Sodium: 142 mmol/L (ref 134–144)
Total Protein: 6.2 g/dL (ref 6.0–8.5)

## 2020-04-15 LAB — VITAMIN B12: Vitamin B-12: 484 pg/mL (ref 232–1245)

## 2020-04-15 LAB — LIPID PANEL
Chol/HDL Ratio: 2.6 ratio (ref 0.0–4.4)
Cholesterol, Total: 120 mg/dL (ref 100–199)
HDL: 46 mg/dL (ref >39)
LDL Chol Calc (NIH): 56 mg/dL (ref 0–99)
Triglycerides: 92 mg/dL (ref 0–149)
VLDL Cholesterol Cal: 18 mg/dL (ref 5–40)

## 2020-04-15 LAB — VITAMIN D 25 HYDROXY (VIT D DEFICIENCY, FRACTURES): Vit D, 25-Hydroxy: 43.3 ng/mL (ref 30.0–100.0)

## 2020-04-15 LAB — HEPATITIS C ANTIBODY: Hep C Virus Ab: <0.1 s/co ratio (ref 0.0–0.9)

## 2020-04-18 ENCOUNTER — Other Ambulatory Visit: Payer: Self-pay

## 2020-04-18 ENCOUNTER — Ambulatory Visit
Admission: RE | Admit: 2020-04-18 | Discharge: 2020-04-18 | Disposition: A | Discharge status: Home / Self Care | Payer: Managed Care, Other (non HMO) | Source: Ambulatory Visit | Attending: Family Medicine | Admitting: Family Medicine

## 2020-04-18 DIAGNOSIS — Z1239 Encounter for other screening for malignant neoplasm of breast: Secondary | ICD-10-CM

## 2020-04-18 DIAGNOSIS — Z1231 Encounter for screening mammogram for malignant neoplasm of breast: Secondary | ICD-10-CM | POA: Insufficient documentation

## 2020-05-15 ENCOUNTER — Telehealth (INDEPENDENT_AMBULATORY_CARE_PROVIDER_SITE_OTHER): Payer: Managed Care, Other (non HMO) | Admitting: Physician Assistant

## 2020-05-15 DIAGNOSIS — Z20822 Contact with and (suspected) exposure to covid-19: Secondary | ICD-10-CM | POA: Diagnosis not present

## 2020-05-15 DIAGNOSIS — J0110 Acute frontal sinusitis, unspecified: Secondary | ICD-10-CM

## 2020-05-15 DIAGNOSIS — R059 Cough, unspecified: Secondary | ICD-10-CM

## 2020-05-15 DIAGNOSIS — J4521 Mild intermittent asthma with (acute) exacerbation: Secondary | ICD-10-CM

## 2020-05-15 DIAGNOSIS — R05 Cough: Secondary | ICD-10-CM

## 2020-05-15 MED ORDER — PROMETHAZINE-DM 6.25-15 MG/5ML PO SYRP: 5.0000 mL | ORAL_SOLUTION | Freq: Every evening | ORAL | 0 refills | Status: DC | PRN

## 2020-05-15 MED ORDER — DOXYCYCLINE HYCLATE 100 MG PO TABS: 100.0000 mg | ORAL_TABLET | Freq: Two times a day (BID) | ORAL | 0 refills | Status: AC

## 2020-05-15 MED ORDER — PREDNISONE 20 MG PO TABS: 40.0000 mg | ORAL_TABLET | Freq: Every day | ORAL | 0 refills | Status: AC

## 2020-05-15 NOTE — Patient Instructions (Signed)
Asthma, Adult  Asthma is a long-term (chronic) condition in which the airways get tight and narrow. The airways are the breathing passages that lead from the nose and mouth down into the lungs. A person with asthma will have times when symptoms get worse. These are called asthma attacks. They can cause coughing, whistling sounds when you breathe (wheezing), shortness of breath, and chest pain. They can make it hard to breathe. There is no cure for asthma, but medicines and lifestyle changes can help control it. There are many things that can bring on an asthma attack or make asthma symptoms worse (triggers). Common triggers include:  Mold.  Dust.  Cigarette smoke.  Cockroaches.  Things that can cause allergy symptoms (allergens). These include animal skin flakes (dander) and pollen from trees or grass.  Things that pollute the air. These may include household cleaners, wood smoke, smog, or chemical odors.  Cold air, weather changes, and wind.  Crying or laughing hard.  Stress.  Certain medicines or drugs.  Certain foods such as dried fruit, potato chips, and grape juice.  Infections, such as a cold or the flu.  Certain medical conditions or diseases.  Exercise or tiring activities. Asthma may be treated with medicines and by staying away from the things that cause asthma attacks. Types of medicines may include:  Controller medicines. These help prevent asthma symptoms. They are usually taken every day.  Fast-acting reliever or rescue medicines. These quickly relieve asthma symptoms. They are used as needed and provide short-term relief.  Allergy medicines if your attacks are brought on by allergens.  Medicines to help control the body's defense (immune) system. Follow these instructions at home: Avoiding triggers in your home  Change your heating and air conditioning filter often.  Limit your use of fireplaces and wood stoves.  Get rid of pests (such as roaches and  mice) and their droppings.  Throw away plants if you see mold on them.  Clean your floors. Dust regularly. Use cleaning products that do not smell.  Have someone vacuum when you are not home. Use a vacuum cleaner with a HEPA filter if possible.  Replace carpet with wood, tile, or vinyl flooring. Carpet can trap animal skin flakes and dust.  Use allergy-proof pillows, mattress covers, and box spring covers.  Wash bed sheets and blankets every week in hot water. Dry them in a dryer.  Keep your bedroom free of any triggers.  Avoid pets and keep windows closed when things that cause allergy symptoms are in the air.  Use blankets that are made of polyester or cotton.  Clean bathrooms and kitchens with bleach. If possible, have someone repaint the walls in these rooms with mold-resistant paint. Keep out of the rooms that are being cleaned and painted.  Wash your hands often with soap and water. If soap and water are not available, use hand sanitizer.  Do not allow anyone to smoke in your home. General instructions  Take over-the-counter and prescription medicines only as told by your doctor. ? Talk with your doctor if you have questions about how or when to take your medicines. ? Make note if you need to use your medicines more often than usual.  Do not use any products that contain nicotine or tobacco, such as cigarettes and e-cigarettes. If you need help quitting, ask your doctor.  Stay away from secondhand smoke.  Avoid doing things outdoors when allergen counts are high and when air quality is low.  Wear a ski mask   when doing outdoor activities in the winter. The mask should cover your nose and mouth. Exercise indoors on cold days if you can.  Warm up before you exercise. Take time to cool down after exercise.  Use a peak flow meter as told by your doctor. A peak flow meter is a tool that measures how well the lungs are working.  Keep track of the peak flow meter's readings.  Write them down.  Follow your asthma action plan. This is a written plan for taking care of your asthma and treating your attacks.  Make sure you get all the shots (vaccines) that your doctor recommends. Ask your doctor about a flu shot and a pneumonia shot.  Keep all follow-up visits as told by your doctor. This is important. Contact a doctor if:  You have wheezing, shortness of breath, or a cough even while taking medicine to prevent attacks.  The mucus you cough up (sputum) is thicker than usual.  The mucus you cough up changes from clear or white to yellow, green, gray, or bloody.  You have problems from the medicine you are taking, such as: ? A rash. ? Itching. ? Swelling. ? Trouble breathing.  You need reliever medicines more than 2-3 times a week.  Your peak flow reading is still at 50-79% of your personal best after following the action plan for 1 hour.  You have a fever. Get help right away if:  You seem to be worse and are not responding to medicine during an asthma attack.  You are short of breath even at rest.  You get short of breath when doing very little activity.  You have trouble eating, drinking, or talking.  You have chest pain or tightness.  You have a fast heartbeat.  Your lips or fingernails start to turn blue.  You are light-headed or dizzy, or you faint.  Your peak flow is less than 50% of your personal best.  You feel too tired to breathe normally. Summary  Asthma is a long-term (chronic) condition in which the airways get tight and narrow. An asthma attack can make it hard to breathe.  Asthma cannot be cured, but medicines and lifestyle changes can help control it.  Make sure you understand how to avoid triggers and how and when to use your medicines. This information is not intended to replace advice given to you by your health care provider. Make sure you discuss any questions you have with your health care provider. Document Revised:  12/10/2018 Document Reviewed: 11/11/2016 [employer]vier Patient Education  2020 [employer]  

## 2020-05-15 NOTE — Progress Notes (Signed)
MyChart Video Visit    Virtual Visit via Video Note   This visit type was conducted due to national recommendations for restrictions regarding the COVID-19 Pandemic (e.g. social distancing) in an effort to limit this patient's exposure and mitigate transmission in our community. This patient is at least at moderate risk for complications without adequate follow up. This format is felt to be most appropriate for this patient at this time. Physical exam was limited by quality of the video and audio technology used for the visit.   Patient location: Home Provider location: Office    Patient: Nancy Blake   DOB: 1966/05/07   54 y.o. Female  MRN: [id number] Visit Date: 05/15/2020  Today's healthcare provider: Trey Sailors, PA-C   Chief Complaint  Patient presents with  . URI   Subjective    URI  This is a new problem. The current episode started in the past 7 days. The problem has been gradually worsening. There has been no fever. Associated symptoms include congestion, coughing, ear pain, rhinorrhea, sinus pain and a sore throat. Pertinent negatives include no chest pain, headaches, nausea, sneezing, vomiting or wheezing. She has tried decongestant and antihistamine for the symptoms. The treatment provided mild relief.  Patient reports having ear pain as well. Patient reports that she doesn't have her COVID vaccines. Patient reports significant worsening yesterday. Patient has a history of asthma and bronchitis which have required hospitalization in 2016. She was taking Breo at one point but is no longer taking this currently. Taking allergy medications every day.   HPI    URI    Patient  is drinking plenty of fluids.  Past hisotry is significant for  asthma.  Patient is not a smoker.       Last edited by Margo Common, CMA on 05/15/2020  8:51 AM. (History)         Medications: Outpatient Medications Prior to Visit  Medication Sig  . acetaminophen (TYLENOL)  500 MG tablet Take 500 mg by mouth every 6 (six) hours as needed.  Marland Kitchen albuterol (VENTOLIN HFA) 108 (90 Base) MCG/ACT inhaler Inhale 2 puffs into the lungs every 6 (six) hours as needed for wheezing or shortness of breath.  Marland Kitchen atorvastatin (LIPITOR) 10 MG tablet Take 1 tablet (10 mg total) by mouth daily.  . cetirizine (ZYRTEC) 10 MG tablet TAKE 1 TABLET BY MOUTH EVERY DAY  . cholecalciferol (VITAMIN D3) 25 MCG (1000 UNIT) tablet Take 1,000 Units by mouth daily.  Marland Kitchen glucose blood test strip Use as instructed to test blood glucose daily  . ibuprofen (ADVIL,MOTRIN) 200 MG tablet Take 600 mg by mouth every 6 (six) hours as needed for mild pain or moderate pain.  Marland Kitchen ipratropium-albuterol (DUONEB) 0.5-2.5 (3) MG/3ML SOLN Take 3 mLs by nebulization every 4 (four) hours as needed.  . metFORMIN (GLUCOPHAGE XR) 500 MG 24 hr tablet Take 2 tablets (1,000 mg total) by mouth daily with breakfast AND 1 tablet (500 mg total) every evening.  . Multiple Vitamin (MULTIVITAMIN) tablet Take 1 tablet by mouth daily.  Marland Kitchen omeprazole (PRILOSEC) 20 MG capsule Take 20 mg by mouth daily.   No facility-administered medications prior to visit.    Review of Systems  HENT: Positive for congestion, ear pain, postnasal drip, rhinorrhea, sinus pressure, sinus pain, sore throat and trouble swallowing. Negative for sneezing.   Respiratory: Positive for cough and chest tightness. Negative for shortness of breath and wheezing.   Cardiovascular: Negative for chest pain.  Gastrointestinal:  Negative for nausea and vomiting.  Neurological: Negative for headaches.    {Heme  Chem  Endocrine  Serology  Results Review (optional):[id number]::" "}  Objective    There were no vitals taken for this visit.   Physical Exam Constitutional:      Appearance: Normal appearance. She is ill-appearing.  Cardiovascular:     Heart sounds: Normal heart sounds.  Pulmonary:     Effort: Pulmonary effort is normal. No respiratory distress.    Neurological:     Mental Status: She is alert.  Psychiatric:        Mood and Affect: Mood normal.        Behavior: Behavior normal.        Assessment & Plan    1. Mild intermittent asthma with acute exacerbation  - symptoms and exam c/w w/ bronchitis and sinusitis - concern for possible COVID19 infection - will send for outpatient testing - discussed need to quarantine 10 days from start of symptoms and until fever-free for at least 48 hours - discussed need to quarantine household members - discussed symptomatic management, natural course, and return precautions -strongly encouraged to get vaccinated against COVID.   - predniSONE (DELTASONE) 20 MG tablet; Take 2 tablets (40 mg total) by mouth daily with breakfast for 5 days.  Dispense: 10 tablet; Refill: 0 - doxycycline (VIBRA-TABS) 100 MG tablet; Take 1 tablet (100 mg total) by mouth 2 (two) times daily for 7 days.  Dispense: 14 tablet; Refill: 0  2. Acute non-recurrent frontal sinusitis  - predniSONE (DELTASONE) 20 MG tablet; Take 2 tablets (40 mg total) by mouth daily with breakfast for 5 days.  Dispense: 10 tablet; Refill: 0 - doxycycline (VIBRA-TABS) 100 MG tablet; Take 1 tablet (100 mg total) by mouth 2 (two) times daily for 7 days.  Dispense: 14 tablet; Refill: 0  3. Contact with and (suspected) exposure to covid-19   4. Cough  Phenergan DM PRN.  No follow-ups on file.     I discussed the assessment and treatment plan with the patient. The patient was provided an opportunity to ask questions and all were answered. The patient agreed with the plan and demonstrated an understanding of the instructions.   The patient was advised to call back or seek an in-person evaluation if the symptoms worsen or if the condition fails to improve as anticipated.   ITrey Sailors, PA-C, have reviewed all documentation for this visit. The documentation on 05/15/20 for the exam, diagnosis, procedures, and orders are all accurate  and complete.   Maryella Shivers [facility] [phone] (phone) [phone] (fax)  [facility]

## 2020-05-19 ENCOUNTER — Telehealth: Payer: Self-pay | Admitting: Internal Medicine

## 2020-05-19 NOTE — Telephone Encounter (Signed)
Pt states that she has had a sinus infection and bronchitis. Osvaldo Angst, PA had prescribed her an antibiotic and prednisone. She uses her neb machine 2 times a day. She just wanted to check w/ [facility] to make sure that she was doing enough, she just doesn't want to get sicker. She is due for a f/u w/ [facility], however [facility] didn't have any openings and I offered appt w/ APP but would rather wait for [facility] if possible. Cb#: [phone]

## 2020-05-19 NOTE — Telephone Encounter (Signed)
Pt is aware of below recommendations.  Pt voiced her understanding and had no further questions.  Nothing further is needed at this time.

## 2020-05-19 NOTE — Telephone Encounter (Signed)
Recommend eval at Urgent care.

## 2020-05-19 NOTE — Telephone Encounter (Signed)
Called and spoke to pt.  Pt stated that she is currently being treated with doxycycline and prednisone for sinus infection and bronchitis.  Pt took last does of prednisone today and will complete doxy on Sunday. Pt reports of some improvement in sx, however chest congestion and wheezing is still present.  Denied fever, chills or sweats.  Pt is doing albuterol neb BID.  Pt questions if anything should be added to treatment plan. She is concerned about wheezing.   Dr. Jayme Cloud please advise, as Dr. Belia Heman is unavailable. Thanks

## 2020-07-11 LAB — HM DIABETES EYE EXAM

## 2020-07-12 ENCOUNTER — Other Ambulatory Visit: Payer: Self-pay | Admitting: Family Medicine

## 2020-07-12 NOTE — Telephone Encounter (Signed)
[facility] faxed refill request for the following medications:  atorvastatin (LIPITOR) 10 MG tablet  Last Rx: 05/27/2019 LOV: 04/10/2020 Please advise. Thanks TNP

## 2020-07-13 MED ORDER — ATORVASTATIN CALCIUM 10 MG PO TABS: 10.0000 mg | ORAL_TABLET | Freq: Every day | ORAL | 1 refills | Status: DC

## 2020-07-14 ENCOUNTER — Encounter: Payer: Self-pay | Admitting: Family Medicine

## 2020-07-24 ENCOUNTER — Encounter: Payer: Self-pay | Admitting: Internal Medicine

## 2020-07-24 ENCOUNTER — Other Ambulatory Visit: Payer: Self-pay

## 2020-07-24 ENCOUNTER — Ambulatory Visit (INDEPENDENT_AMBULATORY_CARE_PROVIDER_SITE_OTHER): Payer: Managed Care, Other (non HMO) | Admitting: Internal Medicine

## 2020-07-24 VITALS — BP 120/78 | HR 77 | Temp 97.3°F | Ht 64.0 in | Wt 236.8 lb

## 2020-07-24 DIAGNOSIS — G4733 Obstructive sleep apnea (adult) (pediatric): Secondary | ICD-10-CM | POA: Diagnosis not present

## 2020-07-24 DIAGNOSIS — J4520 Mild intermittent asthma, uncomplicated: Secondary | ICD-10-CM

## 2020-07-24 MED ORDER — ALBUTEROL SULFATE HFA 108 (90 BASE) MCG/ACT IN AERS: 2.0000 | INHALATION_SPRAY | Freq: Four times a day (QID) | RESPIRATORY_TRACT | 0 refills | Status: DC | PRN

## 2020-07-24 MED ORDER — IPRATROPIUM-ALBUTEROL 0.5-2.5 (3) MG/3ML IN SOLN: 3.0000 mL | RESPIRATORY_TRACT | 1 refills | Status: DC | PRN

## 2020-07-24 NOTE — Progress Notes (Signed)
Nancy Blake      Date: 07/24/2020,   MRN# [id number] Nancy Blake Nancy Blake January 15, 1966    Admission                  Current  Nancy Blake is a 54 y.o. old female seen in for acute care visit of cough, nasal congestion, sob, and chest tightness   PFT  01/03/2016 reviewed with patient Ratio 88% FEV1 98% FVC 87% DLCO 95%  WNL  COMPLIANCE DL 12/1515 Excellent report 100% days 97%>4 hrs AHI 0.3 autoCPAP 5-20  CHIEF COMPLAINT:   Follow up ASTHMA Follow up OSA Follow-up asthma Follow-up asthma   HISTORY OF PRESENT ILLNESS   DX of ASTHMA Asthma seems to be under control at this time   No exacerbation at this time No evidence of heart failure at this time No evidence or signs of infection at this time No respiratory distress No fevers, chills, nausea, vomiting, diarrhea No evidence of lower extremity edema No evidence hemoptysis Uses Breo 3-4 times per week Albuterol as needed  Current regimen Breo 200 Flonase Zyrtec Nasonex   Diagnosis of OSA AHI was 28 Hypoxia to 77% Compliance report reviewed with patient Excellent compliance report 100% for days pleasant 97% for greater than 4 hours AHI  reduce to 0.3   Fatigue has improved Has more energy   Vaccine education provided Highly recommend obtaining vaccine COVID-08 May 2020 Patient with loss of taste and smell 3 weeks of cough congestion body aches sinus drainage Patient was given prednisone and doxycycline That seems to have helped her symptoms  These are and were signs and symptoms of Covid 19 infection   Current Medication:  Current Outpatient Medications:  .  acetaminophen (TYLENOL) 500 MG tablet, Take 500 mg by mouth every 6 (six) hours as needed., Disp: , Rfl:  .  albuterol (VENTOLIN HFA) 108 (90 Base) MCG/ACT inhaler, Inhale 2 puffs into the lungs every 6 (six) hours as needed for wheezing or shortness of breath., Disp: 18 g, Rfl: 0 .  atorvastatin  (LIPITOR) 10 MG tablet, Take 1 tablet (10 mg total) by mouth daily., Disp: 90 tablet, Rfl: 1 .  cetirizine (ZYRTEC) 10 MG tablet, TAKE 1 TABLET BY MOUTH EVERY DAY, Disp: 30 tablet, Rfl: 11 .  cholecalciferol (VITAMIN D3) 25 MCG (1000 UNIT) tablet, Take 1,000 Units by mouth daily., Disp: , Rfl:  .  glucose blood test strip, Use as instructed to test blood glucose daily, Disp: 100 each, Rfl: 12 .  ibuprofen (ADVIL,MOTRIN) 200 MG tablet, Take 600 mg by mouth every 6 (six) hours as needed for mild pain or moderate pain., Disp: , Rfl:  .  ipratropium-albuterol (DUONEB) 0.5-2.5 (3) MG/3ML SOLN, Take 3 mLs by nebulization every 4 (four) hours as needed., Disp: 360 mL, Rfl: 1 .  metFORMIN (GLUCOPHAGE XR) 500 MG 24 hr tablet, Take 2 tablets (1,000 mg total) by mouth daily with breakfast AND 1 tablet (500 mg total) every evening., Disp: 270 tablet, Rfl: 1 .  Multiple Vitamin (MULTIVITAMIN) tablet, Take 1 tablet by mouth daily., Disp: , Rfl:  .  omeprazole (PRILOSEC) 20 MG capsule, Take 20 mg by mouth daily., Disp: , Rfl:  .  promethazine-dextromethorphan (PROMETHAZINE-DM) 6.25-15 MG/5ML syrup, Take 5 mLs by mouth at bedtime as needed., Disp: 118 mL, Rfl: 0    ALLERGIES   Patient has no known allergies.     REVIEW OF SYSTEMS   Review of Systems  Constitutional: Negative  for chills, fever and weight loss.  HENT: Negative for congestion and sore throat.   Eyes: Negative for blurred vision.  Respiratory: Negative for cough, hemoptysis, sputum production, shortness of breath and wheezing.   Cardiovascular: Negative for chest pain, palpitations and leg swelling.  Gastrointestinal: Negative for heartburn and nausea.  All other systems reviewed and are negative.  BP 120/78 (BP Location: Left Wrist, Patient Position: Sitting, Cuff Size: Normal)   Pulse 77   Temp (!) 97.3 F (36.3 C) (Temporal)   Ht 5\' 4"  (1.626 m)   Wt 236 lb 12.8 oz (107.4 kg)   SpO2 97%   BMI 40.65 kg/m    PHYSICAL EXAM    Physical Exam Constitutional:      General: She is not in acute distress.    Appearance: She is not diaphoretic.  Cardiovascular:     Rate and Rhythm: Normal rate and regular rhythm.     Heart sounds: Normal heart sounds. No murmur heard.   Pulmonary:     Effort: No respiratory distress.     Breath sounds: No stridor. No wheezing or rales.  Skin:    General: Skin is warm.  Neurological:     Mental Status: She is alert and oriented to person, place, and time.     Cranial Nerves: No cranial nerve deficit.        ASSESSMENT/PLAN     Asthma mild intermittent well-controlled  No evidence of exacerbation at this time Asthma seems to be well-controlled Albuterol as needed Avoid allergens   Allergic rhinitis continue intranasal steroids Air purifier in bedroom Zyrtec 10 mg daily  GERD continue PPI  Obesity -recommend significant weight loss -recommend changing diet  Deconditioned state -Recommend increased daily activity and exercise  OSA Patient with excellent compliance report 400% for days Greater than 97% for more than 4 hours/day AHI reduced to 0.8 Patient with excellent compliance report Patient uses benefits from therapy    COVID-19 EDUCATION: The signs and symptoms of COVID-19 were discussed with the patient and how to seek care for testing.  The importance of social distancing was discussed today. Hand Washing Techniques and avoid touching face was advised.     MEDICATION ADJUSTMENTS/LABS AND TESTS ORDERED: Continue inhalers as prescribed Continue auto CPAP as prescribed Recommend weight loss Consider antibody IgG testing for COVID-19  CURRENT MEDICATIONS REVIEWED AT LENGTH WITH PATIENT TODAY   Patient satisfied with Plan of action and management. All questions answered  Follow up in 1 year  TOTAL TIME SPENT 24 mins   Nancy Blake , M.D.  [name]  Medical Director [facility]  Director [facility]

## 2020-07-24 NOTE — Patient Instructions (Signed)
Continue inhalers as prescribed Continue auto CPAP as prescribed Recommend weight loss Avoid allergens Avoid sick contacts

## 2020-07-29 ENCOUNTER — Other Ambulatory Visit: Payer: Self-pay | Admitting: Internal Medicine

## 2020-07-29 DIAGNOSIS — J4520 Mild intermittent asthma, uncomplicated: Secondary | ICD-10-CM

## 2020-07-31 ENCOUNTER — Encounter: Payer: Self-pay | Admitting: Dietician

## 2020-07-31 ENCOUNTER — Encounter: Payer: Managed Care, Other (non HMO) | Attending: Family Medicine | Admitting: Dietician

## 2020-07-31 ENCOUNTER — Other Ambulatory Visit: Payer: Self-pay

## 2020-07-31 VITALS — Ht 64.0 in | Wt 236.9 lb

## 2020-07-31 DIAGNOSIS — Z6841 Body Mass Index (BMI) 40.0 and over, adult: Secondary | ICD-10-CM | POA: Insufficient documentation

## 2020-07-31 DIAGNOSIS — E669 Obesity, unspecified: Secondary | ICD-10-CM | POA: Diagnosis not present

## 2020-07-31 DIAGNOSIS — E119 Type 2 diabetes mellitus without complications: Secondary | ICD-10-CM

## 2020-07-31 DIAGNOSIS — E1169 Type 2 diabetes mellitus with other specified complication: Secondary | ICD-10-CM | POA: Insufficient documentation

## 2020-07-31 NOTE — Progress Notes (Signed)
Medical Nutrition Therapy: Visit start time: 1130  end time: 1230  Assessment:  Diagnosis: type 2 diabetes Past medical history: sleep apnea, GERD, asthma Psychosocial issues/ stress concerns: pt rates stress level as "moderate" and feels "ok" about stress management skills   Preferred learning method:  . Auditory . Visual . Hands-on  Current weight: 236.9 lbs  Height: 5'4" Medications, supplements: reconciled in medical record  Labs 7.5(H) on 04/10/2020  Progress and evaluation:   Pt reports she has tried a low carb diet in the past but found it hard to stick to because it felt restrictive   Pt states M&Ms and ice cream are comfort foods during times of stress   Pt currently working from home and states she sits for most of the day   Pt states her goals include weight loss and reducing medications, if possible     Physical activity: ADLs  Dietary Intake:  Usual eating pattern includes 3 meals and 2 snacks per day. Dining out frequency: 4-5 meals per week.  Breakfast: bowl of special k with milk; peanut butter crackers Lunch: chef salad with crackers 1000 1-2 tbsp; ham/turkey sandwich or PB sandwich with chips  Snack: SF jello pudding, atkins shakes (2-3 times a week); graham crackers; PB crackers   Supper: baked chicken/burgers/country style steak with 2 sides(green beans, salad) Beverages: coke zero, 1-2 bottle water, coffee with splenda and half and half   Nutrition Care Education: Basic nutrition: basic food groups, appropriate nutrient balance, appropriate meal and snack schedule, general nutrition guidelines    Weight control: determining reasonable weight loss rate, importance of low sugar and low fat choices, portion control strategies Advanced nutrition:  cooking techniques, dining out, food label reading Diabetes:  appropriate meal and snack schedule, appropriate carb intake and balance, healthy carb choices, role of fiber, protein, fat Heart health: identifying high  sodium foods, healthy and unhealthy fats, role of fiber, plant sterols, role of exercise Other lifestyle changes:  Mindful eating habits   Nutritional Diagnosis:  NB-1.1 Food and nutrition-related knowledge deficit As related to limited understanding of self management of type 2 diabetes .  As evidenced by pt account of partial nutrition information, pt report of needing help with understanding how to balance carb intake .  Intervention: Discussion and instruction as noted above.  Pt motivated to make changes for better blood sugar control.  Pt noted the changes discussed seem doable and sustainable for her lifestyle.  Established goals for additional change.   Increase fruit and vegetable intake   Incorporate vegetables at lunch and dinner   Incorporate more F/V at snacks  Do not skip breakfast, include whole fruit at breakfast   Try frozen veggies that come in microwaveable pouches (may be tender than fresh and low prep time)  Try canned fruit NOT in heavy syrup (mandarin oranges, peaches, pears, applesauce) for snacks + protein (cheese, PB)  Smoothies may be easier to consume that chewing F/V   Increase physical activity   Incrementally reintroduce exercise back into routine   150 minutes per week is recommendation   Try chair yoga or seated exercises on YouTube (discuss at follow up)   Increase fiber intake   Eat at least 3 servings of whole grains a day  Increase fruit and vegetable intake  Increase fluid intake   Drink at least 64oz water daily   Decrease sodium intake   Reduce sodium intake to recommended 1500mg /day   Read nutrition labels on packaged foods to monitor sodium intake  400-600 mg/meal  <200 mg/snack   Decrease fast food frequency   Avoid processed meats    Decrease saturated fat intake   Try more plant-based sources of protein  Limit processed meats   Switch to low fat dairy products  Education Materials given:  . General diet  guidelines for Diabetes . Mindful eating- hunger scale . Food record- meal ideas  . Plate Planner with food lists  . Goals/ instructions  Learner/ who was taught:  . Patient   Level of understanding: Marland Kitchen Verbalizes/ demonstrates competency  Demonstrated degree of understanding via:   Teach back Learning barriers: . None  Willingness to learn/ readiness for change: . Eager, change in progress  Monitoring and Evaluation:  Dietary intake, exercise, BGs and A1c, and body weight      follow up: December 13 at 11am

## 2020-07-31 NOTE — Patient Instructions (Signed)
   Portion control carbs   2-3 servings at meals   1-2 servings at snacks   Increase water intake, 2-3 bottles a day

## 2020-08-08 ENCOUNTER — Other Ambulatory Visit: Payer: Self-pay

## 2020-08-08 ENCOUNTER — Encounter: Payer: Self-pay | Admitting: Family Medicine

## 2020-08-08 ENCOUNTER — Ambulatory Visit: Payer: Managed Care, Other (non HMO) | Admitting: Family Medicine

## 2020-08-08 VITALS — BP 112/79 | HR 82 | Temp 98.7°F | Ht 64.0 in | Wt 232.0 lb

## 2020-08-08 DIAGNOSIS — E1169 Type 2 diabetes mellitus with other specified complication: Secondary | ICD-10-CM | POA: Diagnosis not present

## 2020-08-08 DIAGNOSIS — E119 Type 2 diabetes mellitus without complications: Secondary | ICD-10-CM | POA: Diagnosis not present

## 2020-08-08 DIAGNOSIS — Z23 Encounter for immunization: Secondary | ICD-10-CM | POA: Diagnosis not present

## 2020-08-08 DIAGNOSIS — E785 Hyperlipidemia, unspecified: Secondary | ICD-10-CM

## 2020-08-08 LAB — POCT GLYCOSYLATED HEMOGLOBIN (HGB A1C): Hemoglobin A1C: 7.9 % — AB (ref 4.0–5.6)

## 2020-08-08 LAB — POCT UA - MICROALBUMIN: Microalbumin Ur, POC: NEGATIVE mg/L

## 2020-08-08 MED ORDER — GLUCOSE BLOOD VI STRP: ORAL_STRIP | 12 refills | Status: DC

## 2020-08-08 NOTE — Assessment & Plan Note (Signed)
Chronic and uncontrolled Hyperglycemia with elevated A1c of 7.9 that increased from 7.5  4 months ago She is now able to tolerate 1500 mg of Metformin XR daily Side effects of diminished from Metformin She saw nutrition about 1 week ago and has changed her diet and already lost 5 pounds and fasting blood sugar this morning was 128 Given the recent improvements in diet and resulting improvement in fasting blood sugars, we will hold off on medication changes at this time At follow-up in 3 months, if A1c is not less than 7, we will consider adding SGLT2 or GLP-1 to her current Metformin On statin Urine microalbumin negative today Foot exam completed today Up-to-date on vaccines and eye exam

## 2020-08-08 NOTE — Assessment & Plan Note (Signed)
Reviewed last lipid panel, well controlled Continue statin at current dose Goal LDL less than 70

## 2020-08-08 NOTE — Assessment & Plan Note (Signed)
Discussed importance of healthy weight management Discussed diet and exercise Congratulated her on recent weight loss

## 2020-08-08 NOTE — Progress Notes (Signed)
Established patient visit   Patient: Nancy Blake   DOB: 09/15/1966   54 y.o. Female  MRN: [id number] Visit Date: 08/08/2020  Today's healthcare provider: Shirlee Latch, MD   Chief Complaint  Patient presents with  . Diabetes  . Hyperlipidemia   Subjective    HPI  Diabetes Mellitus Type II, follow-up  Lab Results  Component Value Date   HGBA1C 7.9 (A) 08/08/2020   HGBA1C 7.5 (A) 04/10/2020   HGBA1C 7.3 (A) 12/06/2019   Last seen for diabetes 4 months ago.  Management since then includes continuing the same treatment. May consider a second medication if A1C is not improved. She reports excellent compliance with treatment. She is not having side effects.   Home blood sugar records: fasting range: lower to mid 100's   Episodes of hypoglycemia? No    Current insulin regiment: None Most Recent Eye Exam: UTD  ---------------------------------------------------------------------------------------------------  Lipid/Cholesterol, follow-up  Last Lipid Panel: Lab Results  Component Value Date   CHOL 120 04/14/2020   LDLCALC 56 04/14/2020   HDL 46 04/14/2020   TRIG 92 04/14/2020    She was last seen for this 4 months ago.  Management since that visit includes no changes.  She reports excellent compliance with treatment. She is not having side effects.   Symptoms: No appetite changes No foot ulcerations  No chest pain No chest pressure/discomfort  No dyspnea No orthopnea  No fatigue No lower extremity edema  No palpitations No paroxysmal nocturnal dyspnea  No nausea No numbness or tingling of extremity  No polydipsia No polyuria  No speech difficulty No syncope   She is following a Diabetic diet. Current exercise: walking  Last metabolic panel Lab Results  Component Value Date   GLUCOSE 159 (H) 04/14/2020   NA 142 04/14/2020   K 4.5 04/14/2020   BUN 12 04/14/2020   CREATININE 0.86 04/14/2020   GFRNONAA 76 04/14/2020   GFRAA 88  04/14/2020   CALCIUM 9.0 04/14/2020   AST 15 04/14/2020   ALT 21 04/14/2020   The ASCVD Risk score Denman George DC Jr., et al., 2013) failed to calculate for the following reasons:   The valid total cholesterol range is 130 to 320 mg/dL  ---------------------------------------------------------------------------------------------------      Medications: Outpatient Medications Prior to Visit  Medication Sig  . acetaminophen (TYLENOL) 500 MG tablet Take 500 mg by mouth every 6 (six) hours as needed.  Marland Kitchen albuterol (VENTOLIN HFA) 108 (90 Base) MCG/ACT inhaler Inhale 2 puffs into the lungs every 6 (six) hours as needed for wheezing or shortness of breath.  Marland Kitchen atorvastatin (LIPITOR) 10 MG tablet Take 1 tablet (10 mg total) by mouth daily.  . cetirizine (ZYRTEC) 10 MG tablet TAKE 1 TABLET BY MOUTH EVERY DAY  . cholecalciferol (VITAMIN D3) 25 MCG (1000 UNIT) tablet Take 1,000 Units by mouth daily.  Marland Kitchen ibuprofen (ADVIL,MOTRIN) 200 MG tablet Take 600 mg by mouth every 6 (six) hours as needed for mild pain or moderate pain.  Marland Kitchen ipratropium-albuterol (DUONEB) 0.5-2.5 (3) MG/3ML SOLN Take 3 mLs by nebulization every 4 (four) hours as needed.  . metFORMIN (GLUCOPHAGE XR) 500 MG 24 hr tablet Take 2 tablets (1,000 mg total) by mouth daily with breakfast AND 1 tablet (500 mg total) every evening.  . Multiple Vitamin (MULTIVITAMIN) tablet Take 1 tablet by mouth daily.  Marland Kitchen omeprazole (PRILOSEC) 20 MG capsule Take 20 mg by mouth daily.  . [DISCONTINUED] glucose blood test strip Use as instructed to test  blood glucose daily  . [DISCONTINUED] promethazine-dextromethorphan (PROMETHAZINE-DM) 6.25-15 MG/5ML syrup Take 5 mLs by mouth at bedtime as needed. (Patient not taking: Reported on 07/24/2020)   No facility-administered medications prior to visit.    Review of Systems  Constitutional: Negative.   Respiratory: Negative.   Cardiovascular: Negative.   Gastrointestinal: Negative.   Endocrine: Negative.   Skin:  Negative for wound.  Neurological: Negative for dizziness, light-headedness and headaches.      Objective    BP 112/79 (BP Location: Left Arm, Patient Position: Sitting, Cuff Size: Large)   Pulse 82   Temp 98.7 F (37.1 C) (Oral)   Ht 5\' 4"  (1.626 m)   Wt 232 lb (105.2 kg)   BMI 39.82 kg/m    Physical Exam Vitals reviewed.  Constitutional:      General: She is not in acute distress.    Appearance: Normal appearance. She is well-developed. She is not diaphoretic.  HENT:     Head: Normocephalic and atraumatic.  Eyes:     General: No scleral icterus.    Conjunctiva/sclera: Conjunctivae normal.  Neck:     Thyroid: No thyromegaly.  Cardiovascular:     Rate and Rhythm: Normal rate and regular rhythm.     Pulses: Normal pulses.     Heart sounds: Normal heart sounds. No murmur heard.   Pulmonary:     Effort: Pulmonary effort is normal. No respiratory distress.     Breath sounds: Normal breath sounds. No wheezing, rhonchi or rales.  Musculoskeletal:     Cervical back: Neck supple.     Right lower leg: No edema.     Left lower leg: No edema.  Lymphadenopathy:     Cervical: No cervical adenopathy.  Skin:    General: Skin is warm and dry.     Findings: No rash.  Neurological:     Mental Status: She is alert and oriented to person, place, and time. Mental status is at baseline.  Psychiatric:        Mood and Affect: Mood normal.        Behavior: Behavior normal.       Results for orders placed or performed in visit on 08/08/20  POCT glycosylated hemoglobin (Hb A1C)  Result Value Ref Range   Hemoglobin A1C 7.9 (A) 4.0 - 5.6 %  POCT UA - Microalbumin  Result Value Ref Range   Microalbumin Ur, POC Negative mg/L    Assessment & Plan     Problem List Items Addressed This Visit      Endocrine   Diabetes (HCC) - Primary    Chronic and uncontrolled Hyperglycemia with elevated A1c of 7.9 that increased from 7.5  4 months ago She is now able to tolerate 1500 mg of  Metformin XR daily Side effects of diminished from Metformin She saw nutrition about 1 week ago and has changed her diet and already lost 5 pounds and fasting blood sugar this morning was 128 Given the recent improvements in diet and resulting improvement in fasting blood sugars, we will hold off on medication changes at this time At follow-up in 3 months, if A1c is not less than 7, we will consider adding SGLT2 or GLP-1 to her current Metformin On statin Urine microalbumin negative today Foot exam completed today Up-to-date on vaccines and eye exam      Relevant Medications   glucose blood test strip   Other Relevant Orders   POCT glycosylated hemoglobin (Hb A1C) (Completed)   POCT UA - Microalbumin (  Completed)   Hyperlipidemia associated with type 2 diabetes mellitus (HCC)    Reviewed last lipid panel, well controlled Continue statin at current dose Goal LDL less than 70        Other   Morbid obesity (HCC)    Discussed importance of healthy weight management Discussed diet and exercise Congratulated her on recent weight loss       Other Visit Diagnoses    Need for influenza vaccination       Relevant Orders   Flu Vaccine QUAD 6+ mos PF IM (Fluarix Quad PF) (Completed)       Return in about 3 months (around 11/08/2020) for CPE, chronic disease f/u.      I, Shirlee Latch, MD, have reviewed all documentation for this visit. The documentation on 08/08/20 for the exam, diagnosis, procedures, and orders are all accurate and complete.   Nastasha Reising, Marzella Schlein, MD, MPH [facility]

## 2020-09-18 ENCOUNTER — Other Ambulatory Visit: Payer: Self-pay | Admitting: Family Medicine

## 2020-09-18 MED ORDER — METFORMIN HCL ER 500 MG PO TB24: ORAL_TABLET | ORAL | 1 refills | Status: DC

## 2020-09-18 NOTE — Telephone Encounter (Signed)
[facility] faxed refill request for the following medications:  metFORMIN (GLUCOPHAGE XR) 500 MG 24 hr tablet  Please advise. Thanks, [employer]

## 2020-10-02 ENCOUNTER — Ambulatory Visit: Payer: Managed Care, Other (non HMO) | Admitting: Dietician

## 2020-10-06 ENCOUNTER — Other Ambulatory Visit: Payer: Self-pay | Admitting: Internal Medicine

## 2020-11-09 ENCOUNTER — Encounter: Payer: Managed Care, Other (non HMO) | Admitting: Family Medicine

## 2020-11-09 ENCOUNTER — Telehealth (INDEPENDENT_AMBULATORY_CARE_PROVIDER_SITE_OTHER): Payer: Managed Care, Other (non HMO) | Admitting: Family Medicine

## 2020-11-09 ENCOUNTER — Encounter: Payer: Self-pay | Admitting: Family Medicine

## 2020-11-09 ENCOUNTER — Ambulatory Visit: Payer: Self-pay

## 2020-11-09 DIAGNOSIS — U071 COVID-19: Secondary | ICD-10-CM

## 2020-11-09 DIAGNOSIS — J4541 Moderate persistent asthma with (acute) exacerbation: Secondary | ICD-10-CM | POA: Diagnosis not present

## 2020-11-09 MED ORDER — PREDNISONE 20 MG PO TABS: 40.0000 mg | ORAL_TABLET | Freq: Every day | ORAL | 0 refills | Status: AC

## 2020-11-09 MED ORDER — BENZONATATE 100 MG PO CAPS: 100.0000 mg | ORAL_CAPSULE | Freq: Two times a day (BID) | ORAL | 0 refills | Status: DC | PRN

## 2020-11-09 NOTE — Progress Notes (Signed)
MyChart Video Visit    Virtual Visit via Video Note   This visit type was conducted due to national recommendations for restrictions regarding the COVID-19 Pandemic (e.g. social distancing) in an effort to limit this patient's exposure and mitigate transmission in our community. This patient is at least at moderate risk for complications without adequate follow up. This format is felt to be most appropriate for this patient at this time. Physical exam was limited by quality of the video and audio technology used for the visit.    Patient location: home Provider location: home office Persons involved in the visit: patient, provider  I discussed the limitations of evaluation and management by telemedicine and the availability of in person appointments. The patient expressed understanding and agreed to proceed.  Patient: Nancy Blake   DOB: 12-22-1965   55 y.o. Female  MRN: [id number] Visit Date: 11/09/2020  Today's healthcare provider: Shirlee Latch, MD   Chief Complaint  Patient presents with  . Covid Positive   Subjective    HPI  Pt tested positive for Covid 11/09/2020.  Symptoms started 11/04/2020.  Started feeling better 2 days ago, but now worse with shortness of breath and wheezing.  Using inhaler which helps some.  Getting SOB with speaking in full sentences on virtual call for work.   Patient Active Problem List   Diagnosis Date Noted  . Vitamin D deficiency 04/10/2020  . Long-term use of high-risk medication 04/10/2020  . OSA (obstructive sleep apnea) 12/03/2018  . Morbid obesity (HCC) 12/03/2018  . Hyperlipidemia associated with type 2 diabetes mellitus (HCC) 12/03/2018  . Menopausal symptoms 04/13/2018  . Chronic bilateral low back pain without sciatica 04/13/2018  . Rectal polyp   . Diabetes (HCC) 12/08/2015  . Asthma 10/14/2015  . Allergic rhinitis 04/01/2009  . Acid reflux 03/31/2009   Past Medical History:  Diagnosis Date  . Allergic rhinitis    . Asthma   . CPAP (continuous positive airway pressure) dependence   . Diabetes mellitus without complication (HCC)   . Pneumonia   . Sleep apnea    Social History   Tobacco Use  . Smoking status: Former Smoker    Quit date: 10/21/1983    Years since quitting: 37.0  . Smokeless tobacco: Never Used  Vaping Use  . Vaping Use: Never used  Substance Use Topics  . Alcohol use: No  . Drug use: No   No Known Allergies  Medications: Outpatient Medications Prior to Visit  Medication Sig  . acetaminophen (TYLENOL) 500 MG tablet Take 500 mg by mouth every 6 (six) hours as needed.  Marland Kitchen albuterol (VENTOLIN HFA) 108 (90 Base) MCG/ACT inhaler Inhale 2 puffs into the lungs every 6 (six) hours as needed for wheezing or shortness of breath.  Marland Kitchen atorvastatin (LIPITOR) 10 MG tablet Take 1 tablet (10 mg total) by mouth daily.  . cetirizine (ZYRTEC) 10 MG tablet TAKE 1 TABLET BY MOUTH EVERY DAY  . cholecalciferol (VITAMIN D3) 25 MCG (1000 UNIT) tablet Take 1,000 Units by mouth daily.  Marland Kitchen glucose blood test strip Use as instructed to test blood glucose daily  . ibuprofen (ADVIL,MOTRIN) 200 MG tablet Take 600 mg by mouth every 6 (six) hours as needed for mild pain or moderate pain.  Marland Kitchen ipratropium-albuterol (DUONEB) 0.5-2.5 (3) MG/3ML SOLN Take 3 mLs by nebulization every 4 (four) hours as needed.  . metFORMIN (GLUCOPHAGE XR) 500 MG 24 hr tablet Take 2 tablets (1,000 mg total) by mouth daily with breakfast AND  1 tablet (500 mg total) every evening.  . Multiple Vitamin (MULTIVITAMIN) tablet Take 1 tablet by mouth daily.  Marland Kitchen omeprazole (PRILOSEC) 20 MG capsule Take 20 mg by mouth daily.   No facility-administered medications prior to visit.    Review of Systems  Constitutional: Positive for fatigue and fever. Negative for activity change, appetite change, chills, diaphoresis and unexpected weight change.  HENT: Positive for congestion and sore throat. Negative for ear discharge, ear pain, postnasal drip,  rhinorrhea, sinus pressure, sinus pain, sneezing, tinnitus and voice change.   Eyes: Negative.   Respiratory: Positive for cough, chest tightness and shortness of breath. Negative for apnea, choking, wheezing and stridor.   Gastrointestinal: Positive for diarrhea. Negative for abdominal distention, abdominal pain, anal bleeding, blood in stool, constipation, nausea, rectal pain and vomiting.  Musculoskeletal: Positive for myalgias.  Neurological: Positive for headaches. Negative for light-headedness.      Objective    There were no vitals taken for this visit.   Physical Exam Constitutional:      General: She is not in acute distress.    Appearance: Normal appearance.  HENT:     Head: Normocephalic.  Pulmonary:     Effort: Pulmonary effort is normal. No respiratory distress.     Comments: Able to speak in full sentences, but occasionally pausing to breath deeply Neurological:     Mental Status: She is alert.        Assessment & Plan     1. COVID-19 - continue symptomatic management - tessalon prn for cough - now with asthma exacerbation as below - no indication for CXR, but discussed warning signs of pneumonia - discussed return and emergent precautions   2. Moderate persistent asthma with exacerbation -New problem - Likely secondary to COVID-19 as above - Continue inhaler/nebulizer as needed, up to every 4 hours - Prednisone burst x5 days - Discussed that there is no need for antibiotics at this time - Discussed warning signs for when she may need to seek in person care - Return precautions discussed  Meds ordered this encounter  Medications  . predniSONE (DELTASONE) 20 MG tablet    Sig: Take 2 tablets (40 mg total) by mouth daily with breakfast for 5 days.    Dispense:  10 tablet    Refill:  0  . benzonatate (TESSALON) 100 MG capsule    Sig: Take 1 capsule (100 mg total) by mouth 2 (two) times daily as needed for cough.    Dispense:  20 capsule    Refill:   0      Return if symptoms worsen or fail to improve.     I discussed the assessment and treatment plan with the patient. The patient was provided an opportunity to ask questions and all were answered. The patient agreed with the plan and demonstrated an understanding of the instructions.   The patient was advised to call back or seek an in-person evaluation if the symptoms worsen or if the condition fails to improve as anticipated.  I, Shirlee Latch, MD, have reviewed all documentation for this visit. The documentation on 11/09/20 for the exam, diagnosis, procedures, and orders are all accurate and complete.   Amen Dargis, Marzella Schlein, MD, MPH [facility]

## 2020-11-09 NOTE — Patient Instructions (Signed)
Asthma Attack  Asthma attack, also called acute bronchospasm, is the sudden narrowing and tightening of the air passages, which limits the amount of oxygen that can get into the lungs. The narrowing is caused by inflammation and tightening of the muscles in the air tubes (bronchi) in the lungs. Too much mucus is also produced, which narrows the airways more. This can cause trouble breathing, loud breathing (wheezing), and coughing. The goal of treatment is to open the airways in the lungs and reduce inflammation. What are the causes? Possible causes or triggers of this condition include:  Animal dander, dust mites, or cockroaches.  Mold, pollen from trees or grass, or cold air.  Air pollutants such as dust, household cleaners, aerosol sprays, strong chemicals, strong odors, and smoke of any kind.  Stress or strong emotions such as crying or laughing hard.  Exercise or activity that requires a lot of energy.  Substances in foods and drinks, such as dried fruits and wine, called sulfites.  Certain medicines or medical conditions such as: ? Aspirin or beta-blockers. ? Infections or inflammatory conditions, such as a flu (influenza), a cold, pneumonia, or inflammation of the nasal membranes (rhinitis). ? Gastroesophageal reflux disease (GERD). GERD is a condition in which stomach acid backs up into your esophagus and spills into your trachea (windpipe), which can irritate your airways. What are the signs or symptoms? Symptoms of this condition include:  Wheezing. This may sound like whistling while breathing. This may only happen at night.  Excessive coughing. This may only happen at night.  Chest tightness or pain.  Shortness of breath.  Feeling like you cannot get enough air no matter how hard you breathe (air hunger). How is this diagnosed? This condition may be diagnosed based on:  Your medical history.  Your symptoms.  A physical exam.  Tests to check for other causes of  your symptoms or other conditions that may have triggered your asthma attack. These tests may include: ? A chest X-ray. ? Blood tests. ? Tests to assess lung function, such as breathing into a device that measures how much air you can inhale and exhale (spirometry). How is this treated? Treatment for this condition depends on the severity and cause of your asthma attack.  For mild attacks, you may receive medicines through a hand-held inhaler (metered dose inhaler, or MDI) or through a device that turns liquid medicine into a mist (nebulizer). These medicines include: ? Quick relief or rescue medicines that quickly relax the airways and lungs. ? Long-acting medicines that are used daily to prevent (control) your asthma symptoms.  For moderate or severe attacks, you may be treated with steroid medicines by mouth or through an IV injection at the hospital.  For severe attacks, you may need oxygen therapy or a breathing machine (ventilator).  If your asthma attack was caused by an infection from bacteria, you will be given antibiotic medicines. Follow these instructions at home: Medicines  Take over-the-counter and prescription medicines only as told by your health care provider. ? Keep your medicines up-to-date. ? Make sure you have all of your medicines available at all times.  If you were prescribed an antibiotic medicine, take it as told by your health care provider. Do not stop taking the antibiotic even if you start to feel better.  Tell your doctor if you may be pregnant to make sure your asthma medicine is safe to use during pregnancy. Avoiding triggers  Keep track of things that trigger your asthma attacks. Avoid   exposure to these triggers.  Do not use any products that contain nicotine or tobacco, such as cigarettes, e-cigarettes, and chewing tobacco. If you need help quitting, ask your health care provider.  When there is a lot of pollen, air pollution, or humidity, keep  windows closed and use an air conditioner or go to places with air conditioning.   Asthma action plan  Work with your health care provider to make a written plan for managing and treating your asthma attacks (asthma action plan). This plan should include: ? A list of your asthma triggers and how to avoid them. ? A list of symptoms that you may have during an asthma attack. ? Information about which medicine to take, when to take the medicine and how much of the medicine to take. ? Information to help you understand your peak flow measurements. ? Daily actions that you can take to control your asthma symptoms. ? Contact information for your health care providers.  If you have an asthma attack, act quickly. Follow the emergency steps on your written asthma action plan. This may prevent you from needing to go to the hospital. General instructions  Avoid excessive exercise or activity until your asthma attack goes away. Ask your health care provider what activities are safe for you and when you can return to your normal activities.  Stay up to date on all your vaccines, such as flu and pneumonia vaccines.  Drink enough fluid to keep your urine pale yellow. Staying hydrated helps keep mucus in your lungs thin so it can be coughed up easily.  Do not use alcohol until you have recovered.  Keep all follow-up visits as told by your health care provider. This is important. Asthma requires careful medical care. Contact a health care provider if:  You have followed your action plan for 1 hour and your peak flow reading is still at 50-79%. This is in the yellow zone, which means "caution."  You need to use your quick reliever medicine more frequently than normal.  Your medicines are causing side effects, such as rash, itching, swelling, or trouble breathing.  Your symptoms do not improve after 48 hours.  You cough up mucus that is thicker than usual.  You have a fever. Get help right away  if:  Your peak flow reading is less than 50% of your personal best. This is in the red zone, which means "danger."  You have trouble breathing.  You develop chest pain or discomfort.  Your medicines no longer seem to be helping.  You are coughing up bloody mucus.  You have a fever and your symptoms suddenly get worse.  You have trouble swallowing.  You feel very tired, and breathing becomes tiring. These symptoms may represent a serious problem that is an emergency. Do not wait to see if the symptoms will go away. Get medical help right away. Call your local emergency services (911 in the [place] Do not drive yourself to the hospital. Summary  Asthma attacks are caused by narrowing or tightness in air passages, which causes shortness of breath, coughing, and loud breathing (wheezing).  Many things can trigger an asthma attack, such as allergens, weather changes, exercise, strong odors, and smoke of any kind.  If you have an asthma attack, act quickly. Follow the emergency steps on your written asthma action plan.  Get help right away if you have severe trouble breathing, chest pain, or fever, or if your home medicines are no longer helping with your symptoms.   This information is not intended to replace advice given to you by your health care provider. Make sure you discuss any questions you have with your health care provider. Document Revised: 10/05/2019 Document Reviewed: 10/05/2019 [employer] Patient Education  2021 [employer]  

## 2020-11-09 NOTE — Telephone Encounter (Signed)
Patient called stating that she did a home COVID-19 test today that is positive.  She states that her symptoms started Saturday and she had fever cough sore throat.  Today she is SOB with dry cough sore throat no fever. She states that she works from home and is unable to speak on the phone to others because she becomes so winded.  She has asthma and tends to get bronchitis.  She has used her inhaler and will use her nebulizer. Symptom tiers and treatment plans were read to patient Isolation 5 days and 24 hours without fever or fever reducing medications Then mask for 5 days. When in public and around others.  Good preventative practices discussed.  Virtual visit scheduled with office help for today. Patient is aware and will be ready for call approximately 15 minutes prior to visit.   Reason for Disposition . MILD difficulty breathing (e.g., minimal/no SOB at rest, SOB with walking, pulse <100)  Answer Assessment - Initial Assessment Questions 1. COVID-19 DIAGNOSIS: "Who made your COVID-19 diagnosis?" "Was it confirmed by a positive lab test?" If not diagnosed by a HCP, ask "Are there lots of cases (community spread) where you live?" Note: See public health department website, if unsure.     Home test  Positive today 2. COVID-19 EXPOSURE: "Was there any known exposure to COVID before the symptoms began?" CDC Definition of close contact: within 6 feet (2 meters) for a total of 15 minutes or more over a 24-hour period.    grandson 3. ONSET: "When did the COVID-19 symptoms start?"      Saturday 4. WORST SYMPTOM: "What is your worst symptom?" (e.g., cough, fever, shortness of breath, muscle aches)    Cough chest tightness weakness 5. COUGH: "Do you have a cough?" If Yes, ask: "How bad is the cough?"       Cough dry 6. FEVER: "Do you have a fever?" If Yes, ask: "What is your temperature, how was it measured, and when did it start?"     None today  100.9 was highest  7. RESPIRATORY STATUS: "Describe  your breathing?" (e.g., shortness of breath, wheezing, unable to speak)      SOB with activity and talking 8. BETTER-SAME-WORSE: "Are you getting better, staying the same or getting worse compared to yesterday?"  If getting worse, ask, "In what way?"     worse 9. HIGH RISK DISEASE: "Do you have any chronic medical problems?" (e.g., asthma, heart or lung disease, weak immune system, obesity, etc.)     Asthma  10. VACCINE: "Have you gotten the COVID-19 vaccine?" If Yes ask: "Which one, how many shots, when did you get it?"       no 11. PREGNANCY: "Is there any chance you are pregnant?" "When was your last menstrual period?"      No partial histerectomy 12. OTHER SYMPTOMS: "Do you have any other symptoms?"  (e.g., chills, fatigue, headache, loss of smell or taste, muscle pain, sore throat; new loss of smell or taste especially support the diagnosis of COVID-19)       Fatigue, sore throat,  Protocols used: CORONAVIRUS (COVID-19) DIAGNOSED OR SUSPECTED-A-AH

## 2020-11-10 ENCOUNTER — Telehealth: Payer: Self-pay | Admitting: Family Medicine

## 2020-11-14 ENCOUNTER — Encounter: Payer: Self-pay | Admitting: Dietician

## 2020-11-14 NOTE — Progress Notes (Signed)
Have not heard back from patient to reschedule her cancelled appointment from 10/02/20. Sent notification to referring provider.

## 2020-12-26 ENCOUNTER — Other Ambulatory Visit: Payer: Self-pay

## 2020-12-26 ENCOUNTER — Encounter: Payer: Self-pay | Admitting: Family Medicine

## 2020-12-26 ENCOUNTER — Ambulatory Visit (INDEPENDENT_AMBULATORY_CARE_PROVIDER_SITE_OTHER): Payer: Managed Care, Other (non HMO) | Admitting: Family Medicine

## 2020-12-26 VITALS — BP 126/86 | HR 92 | Temp 98.5°F | Resp 16 | Ht 63.0 in | Wt 224.5 lb

## 2020-12-26 DIAGNOSIS — Z23 Encounter for immunization: Secondary | ICD-10-CM | POA: Diagnosis not present

## 2020-12-26 DIAGNOSIS — Z124 Encounter for screening for malignant neoplasm of cervix: Secondary | ICD-10-CM | POA: Diagnosis not present

## 2020-12-26 DIAGNOSIS — Z0000 Encounter for general adult medical examination without abnormal findings: Secondary | ICD-10-CM | POA: Diagnosis not present

## 2020-12-26 DIAGNOSIS — E1169 Type 2 diabetes mellitus with other specified complication: Secondary | ICD-10-CM | POA: Diagnosis not present

## 2020-12-26 DIAGNOSIS — Z2821 Immunization not carried out because of patient refusal: Secondary | ICD-10-CM

## 2020-12-26 DIAGNOSIS — E785 Hyperlipidemia, unspecified: Secondary | ICD-10-CM

## 2020-12-26 DIAGNOSIS — Z114 Encounter for screening for human immunodeficiency virus [HIV]: Secondary | ICD-10-CM

## 2020-12-26 DIAGNOSIS — Z1231 Encounter for screening mammogram for malignant neoplasm of breast: Secondary | ICD-10-CM

## 2020-12-26 DIAGNOSIS — Z8616 Personal history of COVID-19: Secondary | ICD-10-CM

## 2020-12-26 MED ORDER — CLOTRIMAZOLE-BETAMETHASONE 1-0.05 % EX CREA: 1.0000 "application " | TOPICAL_CREAM | Freq: Two times a day (BID) | CUTANEOUS | 0 refills | Status: DC

## 2020-12-26 NOTE — Assessment & Plan Note (Signed)
Previously well controlled Continue statin Repeat FLP and CMP Goal LDL < 70 

## 2020-12-26 NOTE — Assessment & Plan Note (Signed)
Chronic and previously uncontrolled Repeat A1c Continue metfomrin Consider adding jardiance if A1c remains elevated UTD on screenings Discussed vaccines On statin F/u in 3 months

## 2020-12-26 NOTE — Patient Instructions (Addendum)
Preventive Care 84-56 Years Old, Female Preventive care refers to lifestyle choices and visits with your health care provider that can promote health and wellness. This includes:  A yearly physical exam. This is also called an annual wellness visit.  Regular dental and eye exams.  Immunizations.  Screening for certain conditions.  Healthy lifestyle choices, such as: ? Eating a healthy diet. ? Getting regular exercise. ? Not using drugs or products that contain nicotine and tobacco. ? Limiting alcohol use. What can I expect for my preventive care visit? Physical exam Your health care provider will check your:  Height and weight. These may be used to calculate your BMI (body mass index). BMI is a measurement that tells if you are at a healthy weight.  Heart rate and blood pressure.  Body temperature.  Skin for abnormal spots. Counseling Your health care provider may ask you questions about your:  Past medical problems.  Family's medical history.  Alcohol, tobacco, and drug use.  Emotional well-being.  Home life and relationship well-being.  Sexual activity.  Diet, exercise, and sleep habits.  Work and work Statistician.  Access to firearms.  Method of birth control.  Menstrual cycle.  Pregnancy history. What immunizations do I need? Vaccines are usually given at various ages, according to a schedule. Your health care provider will recommend vaccines for you based on your age, medical history, and lifestyle or other factors, such as travel or where you work.   What tests do I need? Blood tests  Lipid and cholesterol levels. These may be checked every 5 years, or more often if you are over 3 years old.  Hepatitis C test.  Hepatitis B test. Screening  Lung cancer screening. You may have this screening every year starting at age 73 if you have a 30-pack-year history of smoking and currently smoke or have quit within the past 15 years.  Colorectal cancer  screening. ? All adults should have this screening starting at age 52 and continuing until age 17. ? Your health care provider may recommend screening at age 49 if you are at increased risk. ? You will have tests every 1-10 years, depending on your results and the type of screening test.  Diabetes screening. ? This is done by checking your blood sugar (glucose) after you have not eaten for a while (fasting). ? You may have this done every 1-3 years.  Mammogram. ? This may be done every 1-2 years. ? Talk with your health care provider about when you should start having regular mammograms. This may depend on whether you have a family history of breast cancer.  BRCA-related cancer screening. This may be done if you have a family history of breast, ovarian, tubal, or peritoneal cancers.  Pelvic exam and Pap test. ? This may be done every 3 years starting at age 10. ? Starting at age 11, this may be done every 5 years if you have a Pap test in combination with an HPV test. Other tests  STD (sexually transmitted disease) testing, if you are at risk.  Bone density scan. This is done to screen for osteoporosis. You may have this scan if you are at high risk for osteoporosis. Talk with your health care provider about your test results, treatment options, and if necessary, the need for more tests. Follow these instructions at home: Eating and drinking  Eat a diet that includes fresh fruits and vegetables, whole grains, lean protein, and low-fat dairy products.  Take vitamin and mineral supplements  as recommended by your health care provider.  Do not drink alcohol if: ? Your health care provider tells you not to drink. ? You are pregnant, may be pregnant, or are planning to become pregnant.  If you drink alcohol: ? Limit how much you have to 0-1 drink a day. ? Be aware of how much alcohol is in your drink. In the [place] one drink equals one 12 oz bottle of beer (355 mL), one 5 oz glass of  wine (148 mL), or one 1 oz glass of hard liquor (44 mL).   Lifestyle  Take daily care of your teeth and gums. Brush your teeth every morning and night with fluoride toothpaste. Floss one time each day.  Stay active. Exercise for at least 30 minutes 5 or more days each week.  Do not use any products that contain nicotine or tobacco, such as cigarettes, e-cigarettes, and chewing tobacco. If you need help quitting, ask your health care provider.  Do not use drugs.  If you are sexually active, practice safe sex. Use a condom or other form of protection to prevent STIs (sexually transmitted infections).  If you do not wish to become pregnant, use a form of birth control. If you plan to become pregnant, see your health care provider for a prepregnancy visit.  If told by your health care provider, take low-dose aspirin daily starting at age 50.  Find healthy ways to cope with stress, such as: ? Meditation, yoga, or listening to music. ? Journaling. ? Talking to a trusted person. ? Spending time with friends and family. Safety  Always wear your seat belt while driving or riding in a vehicle.  Do not drive: ? If you have been drinking alcohol. Do not ride with someone who has been drinking. ? When you are tired or distracted. ? While texting.  Wear a helmet and other protective equipment during sports activities.  If you have firearms in your house, make sure you follow all gun safety procedures. What's next?  Visit your health care provider once a year for an annual wellness visit.  Ask your health care provider how often you should have your eyes and teeth checked.  Stay up to date on all vaccines. This information is not intended to replace advice given to you by your health care provider. Make sure you discuss any questions you have with your health care provider. Document Revised: 07/11/2020 Document Reviewed: 06/18/2018 Elsevier Patient Education  2021 Elsevier Inc.  

## 2020-12-26 NOTE — Assessment & Plan Note (Signed)
BMI 39 with DM, HLD, HTN Congratulated on weight loss Discussed importance of healthy weight management Discussed diet and exercise

## 2020-12-26 NOTE — Progress Notes (Signed)
Complete physical exam   Patient: Nancy Blake   DOB: 06/12/1966   55 y.o. Female  MRN: [id number] Visit Date: 12/26/2020  Today's healthcare provider: Shirlee Latch, MD   Chief Complaint  Patient presents with  . Annual Exam   Subjective    Nancy Blake is a 55 y.o. female who presents today for a complete physical exam.  She reports consuming a general diet. Home exercise routine includes stretching. She generally feels well. She reports sleeping well. She does not have additional problems to discuss today.   HPI  11/09/2015 Pap/HPV-negative 04/18/2020 Mammogram-BI-RADS 1 03/19/2016 Colonoscopy-polyps, internal hemorrhoids  Past Medical History:  Diagnosis Date  . Allergic rhinitis   . Asthma   . CPAP (continuous positive airway pressure) dependence   . Diabetes mellitus without complication (HCC)   . Pneumonia   . Sleep apnea    Past Surgical History:  Procedure Laterality Date  . ABDOMINAL HYSTERECTOMY    . APPENDECTOMY    . COLONOSCOPY WITH PROPOFOL N/A 03/19/2016   Procedure: COLONOSCOPY WITH PROPOFOL;  Surgeon: Midge Minium, MD;  Location: [facility] ENDOSCOPY;  Service: Endoscopy;  Laterality: N/A;  . TUBAL LIGATION  1995   Social History   Socioeconomic History  . Marital status: Married    Spouse name: Not on file  . Number of children: Not on file  . Years of education: Not on file  . Highest education level: Not on file  Occupational History  . Not on file  Tobacco Use  . Smoking status: Former Smoker    Quit date: 10/21/1983    Years since quitting: 37.2  . Smokeless tobacco: Never Used  Vaping Use  . Vaping Use: Never used  Substance and Sexual Activity  . Alcohol use: No  . Drug use: No  . Sexual activity: Not on file  Other Topics Concern  . Not on file  Social History Narrative  . Not on file   Social Determinants of Health   Financial Resource Strain: Not on file  Food Insecurity: Not on file  Transportation Needs:  Not on file  Physical Activity: Not on file  Stress: Not on file  Social Connections: Not on file  Intimate Partner Violence: Not on file   Family Status  Relation Name Status  . Mother  Deceased  . Father  Alive  . Brother  Alive  . MGM  Deceased  . MGF  Deceased  . PGF  Deceased  . Neg Hx  (Not Specified)   Family History  Problem Relation Age of Onset  . CAD Mother   . Dementia Mother        Early onset at 80  . Healthy Father   . Healthy Brother   . Osteoporosis Maternal Grandmother   . COPD Maternal Grandfather   . Lung cancer Paternal Grandfather   . Breast cancer Neg Hx    No Known Allergies  Patient Care Team: Erasmo Downer, MD as PCP - General (Family Medicine)   Medications: Outpatient Medications Prior to Visit  Medication Sig  . acetaminophen (TYLENOL) 500 MG tablet Take 500 mg by mouth every 6 (six) hours as needed.  Marland Kitchen albuterol (VENTOLIN HFA) 108 (90 Base) MCG/ACT inhaler Inhale 2 puffs into the lungs every 6 (six) hours as needed for wheezing or shortness of breath.  Marland Kitchen atorvastatin (LIPITOR) 10 MG tablet Take 1 tablet (10 mg total) by mouth daily.  . cetirizine (ZYRTEC) 10 MG tablet TAKE 1 TABLET  BY MOUTH EVERY DAY  . cholecalciferol (VITAMIN D3) 25 MCG (1000 UNIT) tablet Take 1,000 Units by mouth daily.  Marland Kitchen. glucose blood test strip Use as instructed to test blood glucose daily  . ibuprofen (ADVIL,MOTRIN) 200 MG tablet Take 600 mg by mouth every 6 (six) hours as needed for mild pain or moderate pain.  Marland Kitchen. ipratropium-albuterol (DUONEB) 0.5-2.5 (3) MG/3ML SOLN Take 3 mLs by nebulization every 4 (four) hours as needed.  . metFORMIN (GLUCOPHAGE XR) 500 MG 24 hr tablet Take 2 tablets (1,000 mg total) by mouth daily with breakfast AND 1 tablet (500 mg total) every evening.  . Multiple Vitamin (MULTIVITAMIN) tablet Take 1 tablet by mouth daily.  Marland Kitchen. omeprazole (PRILOSEC) 20 MG capsule Take 20 mg by mouth daily.  . [DISCONTINUED] benzonatate (TESSALON) 100 MG  capsule Take 1 capsule (100 mg total) by mouth 2 (two) times daily as needed for cough. (Patient not taking: Reported on 12/26/2020)   No facility-administered medications prior to visit.    Review of Systems  Constitutional: Negative.   HENT: Positive for tinnitus.   Eyes: Negative.   Respiratory: Positive for apnea.   Cardiovascular: Negative.   Gastrointestinal: Negative.   Endocrine: Negative.   Genitourinary: Negative.   Musculoskeletal: Negative.   Skin: Negative.   Allergic/Immunologic: Negative.   Neurological: Negative.   Hematological: Negative.   Psychiatric/Behavioral: Negative.     Last CBC Lab Results  Component Value Date   WBC 7.2 04/17/2018   HGB 14.0 04/17/2018   HCT 41.3 04/17/2018   MCV 81 04/17/2018   MCH 27.6 04/17/2018   RDW 14.5 04/17/2018   PLT 262 04/17/2018   Last metabolic panel Lab Results  Component Value Date   GLUCOSE 159 (H) 04/14/2020   NA 142 04/14/2020   K 4.5 04/14/2020   CL 104 04/14/2020   CO2 25 04/14/2020   BUN 12 04/14/2020   CREATININE 0.86 04/14/2020   GFRNONAA 76 04/14/2020   GFRAA 88 04/14/2020   CALCIUM 9.0 04/14/2020   PROT 6.2 04/14/2020   ALBUMIN 3.9 04/14/2020   LABGLOB 2.3 04/14/2020   AGRATIO 1.7 04/14/2020   BILITOT 0.4 04/14/2020   ALKPHOS 96 04/14/2020   AST 15 04/14/2020   ALT 21 04/14/2020   ANIONGAP 8 10/14/2015   Last lipids Lab Results  Component Value Date   CHOL 120 04/14/2020   HDL 46 04/14/2020   LDLCALC 56 04/14/2020   TRIG 92 04/14/2020   CHOLHDL 2.6 04/14/2020   Last hemoglobin A1c Lab Results  Component Value Date   HGBA1C 7.9 (A) 08/08/2020   Last thyroid functions Lab Results  Component Value Date   TSH 1.940 04/17/2018   Last vitamin D Lab Results  Component Value Date   VD25OH 43.3 04/14/2020   Last vitamin B12 and Folate Lab Results  Component Value Date   VITAMINB12 484 04/14/2020      Objective    BP 126/86 (BP Location: Left Arm, Patient Position: Sitting,  Cuff Size: Large)   Pulse 92   Temp 98.5 F (36.9 C) (Oral)   Resp 16   Ht 5\' 3"  (1.6 m)   Wt 224 lb 8 oz (101.8 kg)   SpO2 99%   BMI 39.77 kg/m  BP Readings from Last 3 Encounters:  12/26/20 126/86  08/08/20 112/79  07/24/20 120/78   Wt Readings from Last 3 Encounters:  12/26/20 224 lb 8 oz (101.8 kg)  08/08/20 232 lb (105.2 kg)  07/31/20 236 lb 14.4 oz (107.5 kg)  Physical Exam Vitals reviewed.  Constitutional:      General: She is not in acute distress.    Appearance: Normal appearance. She is well-developed. She is not diaphoretic.  HENT:     Head: Normocephalic and atraumatic.     Right Ear: Tympanic membrane, ear canal and external ear normal.     Left Ear: Tympanic membrane, ear canal and external ear normal.     Nose: Nose normal.     Mouth/Throat:     Mouth: Mucous membranes are moist.     Pharynx: Oropharynx is clear. No oropharyngeal exudate.  Eyes:     General: No scleral icterus.    Conjunctiva/sclera: Conjunctivae normal.     Pupils: Pupils are equal, round, and reactive to light.  Neck:     Thyroid: No thyromegaly.  Cardiovascular:     Rate and Rhythm: Normal rate and regular rhythm.     Pulses: Normal pulses.     Heart sounds: Normal heart sounds. No murmur heard.   Pulmonary:     Effort: Pulmonary effort is normal. No respiratory distress.     Breath sounds: Normal breath sounds. No wheezing or rales.  Abdominal:     General: There is no distension.     Palpations: Abdomen is soft.     Tenderness: There is no abdominal tenderness.  Musculoskeletal:        General: No deformity.     Cervical back: Neck supple.     Right lower leg: No edema.     Left lower leg: No edema.  Lymphadenopathy:     Cervical: No cervical adenopathy.  Skin:    General: Skin is warm and dry.     Findings: No rash.  Neurological:     Mental Status: She is alert and oriented to person, place, and time. Mental status is at baseline.     Gait: Gait normal.   Psychiatric:        Mood and Affect: Mood normal.        Behavior: Behavior normal.        Thought Content: Thought content normal.       Last depression screening scores PHQ 2/9 Scores 12/26/2020 08/08/2020 07/31/2020  PHQ - 2 Score 0 0 0  PHQ- 9 Score 0 0 -   Last fall risk screening Fall Risk  12/26/2020  Falls in the past year? 0  Number falls in past yr: 0  Injury with Fall? 0  Risk for fall due to : No Fall Risks  Follow up Falls evaluation completed   Last Audit-C alcohol use screening Alcohol Use Disorder Test (AUDIT) 12/26/2020  1. How often do you have a drink containing alcohol? 0  2. How many drinks containing alcohol do you have on a typical day when you are drinking? 0  3. How often do you have six or more drinks on one occasion? 0  AUDIT-C Score 0  Alcohol Brief Interventions/Follow-up AUDIT Score <7 follow-up not indicated   A score of 3 or more in women, and 4 or more in men indicates increased risk for alcohol abuse, EXCEPT if all of the points are from question 1   No results found for any visits on 12/26/20.  Assessment & Plan    Routine Health Maintenance and Physical Exam  Exercise Activities and Dietary recommendations Goals   None     Immunization History  Administered Date(s) Administered  . Influenza Split 08/22/2015, 09/10/2017  . Influenza,inj,Quad PF,6+ Mos 09/10/2017, 08/19/2018, 09/02/2019,  08/08/2020  . Pneumococcal Polysaccharide-23 12/08/2015  . Tdap 04/13/2018  . Zoster Recombinat (Shingrix) 12/26/2020    Health Maintenance  Topic Date Due  . PAP SMEAR-Modifier  11/08/2020  . COVID-19 Vaccine (1) 06/24/2021 (Originally 03/31/1971)  . HEMOGLOBIN A1C  02/06/2021  . OPHTHALMOLOGY EXAM  07/11/2021  . FOOT EXAM  08/08/2021  . URINE MICROALBUMIN  08/08/2021  . MAMMOGRAM  04/18/2022  . COLONOSCOPY (Pts 45-91yrs Insurance coverage will need to be confirmed)  03/19/2026  . TETANUS/TDAP  04/13/2028  . INFLUENZA VACCINE  Completed  .  PNEUMOCOCCAL POLYSACCHARIDE VACCINE AGE 23-64 HIGH RISK  Completed  . Hepatitis C Screening  Completed  . HIV Screening  Completed  . HPV VACCINES  Aged Out    Discussed health benefits of physical activity, and encouraged her to engage in regular exercise appropriate for her age and condition.  Problem List Items Addressed This Visit      Endocrine   Diabetes (HCC)    Chronic and previously uncontrolled Repeat A1c Continue metfomrin Consider adding jardiance if A1c remains elevated UTD on screenings Discussed vaccines On statin F/u in 3 months      Relevant Orders   Comprehensive metabolic panel   Hemoglobin A1c   Hyperlipidemia associated with type 2 diabetes mellitus (HCC)    Previously well controlled Continue statin Repeat FLP and CMP Goal LDL < 70      Relevant Orders   Comprehensive metabolic panel   Lipid Panel With LDL/HDL Ratio     Other   Morbid obesity (HCC)    BMI 39 with DM, HLD, HTN Congratulated on weight loss Discussed importance of healthy weight management Discussed diet and exercise       Relevant Orders   Comprehensive metabolic panel   [id number] vaccination declined    Other Visit Diagnoses    Encounter for annual physical exam    -  Primary   Relevant Orders   Comprehensive metabolic panel   Hemoglobin A1c   Lipid Panel With LDL/HDL Ratio   HIV Antibody (routine testing w rflx)   SAR CoV2 Serology (COVID 19)AB(IGG)IA   MM 3D SCREEN BREAST BILATERAL   Cervical cancer screening       Relevant Orders   IGP, Aptima HPV, rfx 16/18,45   Encounter for screening mammogram for malignant neoplasm of breast       Relevant Orders   MM 3D SCREEN BREAST BILATERAL   Screening for HIV (human immunodeficiency virus)       Relevant Orders   HIV Antibody (routine testing w rflx)   History of COVID-19       Relevant Orders   SAR CoV2 Serology (COVID 19)AB(IGG)IA   Need for shingles vaccine       Relevant Orders   Varicella-zoster vaccine IM  (Completed)       Return in about 3 months (around 03/28/2021) for chronic disease f/u.     I, Shirlee Latch, MD, have reviewed all documentation for this visit. The documentation on 12/26/20 for the exam, diagnosis, procedures, and orders are all accurate and complete.   Amere Iott, Marzella Schlein, MD, MPH [facility]

## 2020-12-28 LAB — COMPREHENSIVE METABOLIC PANEL
ALT: 19 IU/L (ref 0–32)
AST: 11 IU/L (ref 0–40)
Albumin/Globulin Ratio: 1.6 (ref 1.2–2.2)
Albumin: 4.1 g/dL (ref 3.8–4.9)
Alkaline Phosphatase: 94 IU/L (ref 44–121)
BUN/Creatinine Ratio: 14 (ref 9–23)
BUN: 12 mg/dL (ref 6–24)
Bilirubin Total: 0.5 mg/dL (ref 0.0–1.2)
CO2: 24 mmol/L (ref 20–29)
Calcium: 9.3 mg/dL (ref 8.7–10.2)
Chloride: 105 mmol/L (ref 96–106)
Creatinine, Ser: 0.84 mg/dL (ref 0.57–1.00)
Globulin, Total: 2.5 g/dL (ref 1.5–4.5)
Glucose: 139 mg/dL — ABNORMAL HIGH (ref 65–99)
Potassium: 5 mmol/L (ref 3.5–5.2)
Sodium: 142 mmol/L (ref 134–144)
Total Protein: 6.6 g/dL (ref 6.0–8.5)
eGFR: 82 mL/min/{1.73_m2} (ref >59)

## 2020-12-28 LAB — LIPID PANEL WITH LDL/HDL RATIO
Cholesterol, Total: 139 mg/dL (ref 100–199)
HDL: 48 mg/dL (ref >39)
LDL Chol Calc (NIH): 76 mg/dL (ref 0–99)
LDL/HDL Ratio: 1.6 ratio (ref 0.0–3.2)
Triglycerides: 78 mg/dL (ref 0–149)
VLDL Cholesterol Cal: 15 mg/dL (ref 5–40)

## 2020-12-28 LAB — HEMOGLOBIN A1C
Est. average glucose Bld gHb Est-mCnc: 160 mg/dL
Hgb A1c MFr Bld: 7.2 % — ABNORMAL HIGH (ref 4.8–5.6)

## 2020-12-28 LAB — HIV ANTIBODY (ROUTINE TESTING W REFLEX): HIV Screen 4th Generation wRfx: NONREACTIVE

## 2020-12-28 LAB — SAR COV2 SEROLOGY (COVID19)AB(IGG),IA
SARS-CoV-2 Semi-Quant IgG Ab: 31 AU/mL (ref <13.0)
SARS-CoV-2 Spike Ab Interp: POSITIVE

## 2020-12-29 ENCOUNTER — Encounter: Payer: Self-pay | Admitting: Family Medicine

## 2020-12-29 MED ORDER — EMPAGLIFLOZIN 10 MG PO TABS: 10.0000 mg | ORAL_TABLET | Freq: Every day | ORAL | 1 refills | Status: DC

## 2020-12-29 NOTE — Telephone Encounter (Signed)
-----   Message from Erasmo Downer, MD sent at 12/28/2020  5:03 PM EST ----- Normal labs, except for uncontrolled A1c of 7.2.  Cholesterol has increased slightly as well.  If not taking atorvastatin regularly, please resume.  Also work on diet and exercise changes.  Would recommend starting Jardiance 10 mg daily as we discussed for blood sugar.  If patient agrees to this, okay to send in 90-day supply with 1 refill.  Recheck A1c in 3 months at follow-up.

## 2021-01-02 LAB — IGP, APTIMA HPV, RFX 16/18,45
HPV Aptima: NEGATIVE
PAP Smear Comment: 0

## 2021-01-15 ENCOUNTER — Other Ambulatory Visit: Payer: Self-pay | Admitting: Family Medicine

## 2021-01-21 ENCOUNTER — Other Ambulatory Visit: Payer: Self-pay | Admitting: Family Medicine

## 2021-01-23 ENCOUNTER — Encounter: Payer: Self-pay | Admitting: Family Medicine

## 2021-04-09 ENCOUNTER — Ambulatory Visit: Payer: Managed Care, Other (non HMO) | Admitting: Family Medicine

## 2021-04-09 ENCOUNTER — Other Ambulatory Visit: Payer: Self-pay

## 2021-04-09 ENCOUNTER — Encounter: Payer: Self-pay | Admitting: Family Medicine

## 2021-04-09 VITALS — BP 123/85 | HR 83 | Temp 98.4°F | Resp 16 | Ht 64.0 in | Wt 219.0 lb

## 2021-04-09 DIAGNOSIS — J4540 Moderate persistent asthma, uncomplicated: Secondary | ICD-10-CM | POA: Diagnosis not present

## 2021-04-09 DIAGNOSIS — E1169 Type 2 diabetes mellitus with other specified complication: Secondary | ICD-10-CM | POA: Diagnosis not present

## 2021-04-09 DIAGNOSIS — B373 Candidiasis of vulva and vagina: Secondary | ICD-10-CM | POA: Diagnosis not present

## 2021-04-09 DIAGNOSIS — E785 Hyperlipidemia, unspecified: Secondary | ICD-10-CM

## 2021-04-09 DIAGNOSIS — B3731 Acute candidiasis of vulva and vagina: Secondary | ICD-10-CM

## 2021-04-09 LAB — POCT GLYCOSYLATED HEMOGLOBIN (HGB A1C)
Est. average glucose Bld gHb Est-mCnc: 143
Hemoglobin A1C: 6.6 % — AB (ref 4.0–5.6)

## 2021-04-09 MED ORDER — FREESTYLE LIBRE 2 READER DEVI: 5 refills | Status: DC

## 2021-04-09 MED ORDER — FLUCONAZOLE 150 MG PO TABS: 150.0000 mg | ORAL_TABLET | Freq: Once | ORAL | 0 refills | Status: AC

## 2021-04-09 MED ORDER — FREESTYLE LIBRE 2 SENSOR MISC: 3 refills | Status: DC

## 2021-04-09 NOTE — Assessment & Plan Note (Signed)
No current exacerbation, but small episode of SOB yesterday improved with inhaler/nebs No evidence of angina Return precautions discussed

## 2021-04-09 NOTE — Progress Notes (Signed)
Established patient visit   Patient: Nancy Blake   DOB: 1966/02/10   55 y.o. Female  MRN: [id number] Visit Date: 04/09/2021  Today's healthcare provider: Shirlee Latch, MD   Chief Complaint  Patient presents with   Diabetes   Vaginitis   Shortness of Breath   Subjective    Shortness of Breath This is a new problem. The current episode started more than 1 month ago. The problem occurs constantly. The problem has been gradually improving. Associated symptoms include abdominal pain. Pertinent negatives include no chest pain, ear pain, fever, headaches, leg swelling, rhinorrhea, sore throat, vomiting or wheezing. The symptoms are aggravated by any activity. She has tried steroid inhalers for the symptoms. The treatment provided moderate relief. Her past medical history is significant for asthma.  Vaginal Itching The patient's primary symptoms include genital itching, pelvic pain and vaginal discharge. This is a new problem. The current episode started yesterday. The problem occurs constantly. The problem has been gradually worsening. The pain is mild. Associated symptoms include abdominal pain and back pain. Pertinent negatives include no chills, diarrhea, dysuria, fever, flank pain, frequency, headaches, hematuria, nausea, sore throat, urgency or vomiting. There has been no bleeding. She has tried nothing for the symptoms.  Diabetes Pertinent negatives for hypoglycemia include no dizziness or headaches. Associated symptoms include fatigue. Pertinent negatives for diabetes include no chest pain.   Diabetes Mellitus Type II, Follow-up  Lab Results  Component Value Date   HGBA1C 6.6 (A) 04/09/2021   HGBA1C 7.2 (H) 12/27/2020   HGBA1C 7.9 (A) 08/08/2020   Wt Readings from Last 3 Encounters:  04/09/21 219 lb (99.3 kg)  12/26/20 224 lb 8 oz (101.8 kg)  08/08/20 232 lb (105.2 kg)   Last seen for diabetes 3 months ago.  Management since then includes Continue  metfomrin Consider adding jardiance if A1c remains elevate.  She reports excellent compliance with treatment. She is not having side effects.  Symptoms: Yes fatigue No foot ulcerations  Yes appetite changes No nausea  No paresthesia of the feet  No polydipsia  No polyuria No visual disturbances   No vomiting     Home blood sugar records: fasting range: 110-120's  Episodes of hypoglycemia? No   Current insulin regiment: none Most Recent Eye Exam: UTD Current exercise: walking Current diet habits: in general, an "unhealthy" diet  Pertinent Labs: Lab Results  Component Value Date   CHOL 139 12/27/2020   HDL 48 12/27/2020   LDLCALC 76 12/27/2020   TRIG 78 12/27/2020   CHOLHDL 2.6 04/14/2020   Lab Results  Component Value Date   NA 142 12/27/2020   K 5.0 12/27/2020   CREATININE 0.84 12/27/2020   GFRNONAA 76 04/14/2020   GFRAA 88 04/14/2020   GLUCOSE 139 (H) 12/27/2020     ---------------------------------------------------------------------------------------------------   Patient Active Problem List   Diagnosis Date Noted   COVID-19 vaccination declined 12/26/2020   Vitamin D deficiency 04/10/2020   Long-term use of high-risk medication 04/10/2020   OSA (obstructive sleep apnea) 12/03/2018   Morbid obesity (HCC) 12/03/2018   Hyperlipidemia associated with type 2 diabetes mellitus (HCC) 12/03/2018   Menopausal symptoms 04/13/2018   Chronic bilateral low back pain without sciatica 04/13/2018   Rectal polyp    Diabetes (HCC) 12/08/2015   Asthma 10/14/2015   Allergic rhinitis 04/01/2009   Acid reflux 03/31/2009   Social History   Tobacco Use   Smoking status: Former    Pack years: 0.00  Types: Cigarettes    Quit date: 10/21/1983    Years since quitting: 37.4   Smokeless tobacco: Never  Vaping Use   Vaping Use: Never used  Substance Use Topics   Alcohol use: No   Drug use: No   No Known Allergies     Medications: Outpatient Medications Prior to  Visit  Medication Sig   acetaminophen (TYLENOL) 500 MG tablet Take 500 mg by mouth every 6 (six) hours as needed.   albuterol (VENTOLIN HFA) 108 (90 Base) MCG/ACT inhaler Inhale 2 puffs into the lungs every 6 (six) hours as needed for wheezing or shortness of breath.   atorvastatin (LIPITOR) 10 MG tablet TAKE 1 TABLET(10 MG) BY MOUTH DAILY   cetirizine (ZYRTEC) 10 MG tablet TAKE 1 TABLET BY MOUTH EVERY DAY   cholecalciferol (VITAMIN D3) 25 MCG (1000 UNIT) tablet Take 1,000 Units by mouth daily.   clotrimazole-betamethasone (LOTRISONE) cream Apply 1 application topically 2 (two) times daily.   empagliflozin (JARDIANCE) 10 MG TABS tablet Take 1 tablet (10 mg total) by mouth daily before breakfast.   glucose blood test strip Use as instructed to test blood glucose daily   ibuprofen (ADVIL,MOTRIN) 200 MG tablet Take 600 mg by mouth every 6 (six) hours as needed for mild pain or moderate pain.   ipratropium-albuterol (DUONEB) 0.5-2.5 (3) MG/3ML SOLN Take 3 mLs by nebulization every 4 (four) hours as needed.   metFORMIN (GLUCOPHAGE XR) 500 MG 24 hr tablet Take 2 tablets (1,000 mg total) by mouth daily with breakfast AND 1 tablet (500 mg total) every evening.   Multiple Vitamin (MULTIVITAMIN) tablet Take 1 tablet by mouth daily.   omeprazole (PRILOSEC) 20 MG capsule Take 20 mg by mouth daily.   No facility-administered medications prior to visit.    Review of Systems  Constitutional:  Positive for activity change, appetite change and fatigue. Negative for chills and fever.  HENT:  Negative for congestion, ear pain, rhinorrhea, sinus pain and sore throat.   Respiratory:  Positive for shortness of breath. Negative for cough, chest tightness and wheezing.   Cardiovascular:  Negative for chest pain, palpitations and leg swelling.  Gastrointestinal:  Positive for abdominal pain. Negative for blood in stool, diarrhea, nausea and vomiting.  Genitourinary:  Positive for pelvic pain and vaginal discharge.  Negative for dysuria, flank pain, frequency, hematuria and urgency.  Musculoskeletal:  Positive for back pain.  Neurological:  Negative for dizziness and headaches.       Objective    BP 123/85 (BP Location: Left Arm, Patient Position: Sitting, Cuff Size: Large)   Pulse 83   Temp 98.4 F (36.9 C) (Oral)   Resp 16   Ht 5\' 4"  (1.626 m)   Wt 219 lb (99.3 kg)   SpO2 98%   BMI 37.59 kg/m  BP Readings from Last 3 Encounters:  04/09/21 123/85  12/26/20 126/86  08/08/20 112/79   Wt Readings from Last 3 Encounters:  04/09/21 219 lb (99.3 kg)  12/26/20 224 lb 8 oz (101.8 kg)  08/08/20 232 lb (105.2 kg)       Physical Exam Vitals reviewed.  Constitutional:      General: She is not in acute distress.    Appearance: Normal appearance. She is well-developed. She is not diaphoretic.  HENT:     Head: Normocephalic and atraumatic.  Eyes:     General: No scleral icterus.    Conjunctiva/sclera: Conjunctivae normal.  Neck:     Thyroid: No thyromegaly.  Cardiovascular:  Rate and Rhythm: Normal rate and regular rhythm.     Pulses: Normal pulses.     Heart sounds: Normal heart sounds. No murmur heard. Pulmonary:     Effort: Pulmonary effort is normal. No respiratory distress.     Breath sounds: Normal breath sounds. No wheezing, rhonchi or rales.  Musculoskeletal:     Cervical back: Neck supple.     Right lower leg: No edema.     Left lower leg: No edema.  Lymphadenopathy:     Cervical: No cervical adenopathy.  Skin:    General: Skin is warm and dry.     Findings: No rash.  Neurological:     Mental Status: She is alert and oriented to person, place, and time. Mental status is at baseline.  Psychiatric:        Mood and Affect: Mood normal.        Behavior: Behavior normal.      Results for orders placed or performed in visit on 04/09/21  POCT glycosylated hemoglobin (Hb A1C)  Result Value Ref Range   Hemoglobin A1C 6.6 (A) 4.0 - 5.6 %   Est. average glucose Bld gHb  Est-mCnc 143     Assessment & Plan     Problem List Items Addressed This Visit       Respiratory   Asthma    No current exacerbation, but small episode of SOB yesterday improved with inhaler/nebs No evidence of angina Return precautions discussed         Endocrine   Diabetes (HCC) - Primary    Chronic and well-controlled with A1c 6.6 today Associated with HLD and OSA Continue metformin and low-dose Jardiance Up-to-date on screenings Discussed vaccines. On statin Follow-up in 6 months and repeat A1c       Relevant Medications   Continuous Blood Gluc Receiver (FREESTYLE LIBRE 2 READER) DEVI   Continuous Blood Gluc Sensor (FREESTYLE LIBRE 2 SENSOR) MISC   Other Relevant Orders   POCT glycosylated hemoglobin (Hb A1C) (Completed)   Hyperlipidemia associated with type 2 diabetes mellitus (HCC)    Previously well controlled Continue statin Repeat FLP and CMP Goal LDL < 70          Other   Morbid obesity (HCC)    BMI 37 and assoc with DM, HLD, OSA Discussed importance of healthy weight management Discussed diet and exercise        Other Visit Diagnoses     Yeast vaginitis       Relevant Medications   fluconazole (DIFLUCAN) 150 MG tablet        Return in about 6 months (around 10/09/2021) for chronic disease f/u.      Danelle Earthly [place],acting as a Neurosurgeon for Shirlee Latch, MD.,have documented all relevant documentation on the behalf of Shirlee Latch, MD,as directed by  Shirlee Latch, MD while in the presence of Shirlee Latch, MD.   I, Shirlee Latch, MD, have reviewed all documentation for this visit. The documentation on 04/09/21 for the exam, diagnosis, procedures, and orders are all accurate and complete.   Kenyonna Micek, Marzella Schlein, MD, MPH [facility]

## 2021-04-09 NOTE — Assessment & Plan Note (Addendum)
Chronic and well-controlled with A1c 6.6 today Associated with HLD and OSA Continue metformin and low-dose Jardiance Up-to-date on screenings Discussed vaccines. On statin Follow-up in 6 months and repeat A1c

## 2021-04-09 NOTE — Assessment & Plan Note (Signed)
Previously well controlled Continue statin Repeat FLP and CMP Goal LDL < 70 

## 2021-04-09 NOTE — Assessment & Plan Note (Signed)
BMI 37 and assoc with DM, HLD, OSA Discussed importance of healthy weight management Discussed diet and exercise

## 2021-04-09 NOTE — Patient Instructions (Signed)
Diabetes Mellitus and Nutrition, Adult When you have diabetes, or diabetes mellitus, it is very important to have healthy eating habits because your blood sugar (glucose) levels are greatly affected by what you eat and drink. Eating healthy foods in the right amounts, at about the same times every day, can help you:  Control your blood glucose.  Lower your risk of heart disease.  Improve your blood pressure.  Reach or maintain a healthy weight. What can affect my meal plan? Every person with diabetes is different, and each person has different needs for a meal plan. Your health care provider may recommend that you work with a dietitian to make a meal plan that is best for you. Your meal plan may vary depending on factors such as:  The calories you need.  The medicines you take.  Your weight.  Your blood glucose, blood pressure, and cholesterol levels.  Your activity level.  Other health conditions you have, such as heart or kidney disease. How do carbohydrates affect me? Carbohydrates, also called carbs, affect your blood glucose level more than any other type of food. Eating carbs naturally raises the amount of glucose in your blood. Carb counting is a method for keeping track of how many carbs you eat. Counting carbs is important to keep your blood glucose at a healthy level, especially if you use insulin or take certain oral diabetes medicines. It is important to know how many carbs you can safely have in each meal. This is different for every person. Your dietitian can help you calculate how many carbs you should have at each meal and for each snack. How does alcohol affect me? Alcohol can cause a sudden decrease in blood glucose (hypoglycemia), especially if you use insulin or take certain oral diabetes medicines. Hypoglycemia can be a life-threatening condition. Symptoms of hypoglycemia, such as sleepiness, dizziness, and confusion, are similar to symptoms of having too much  alcohol.  Do not drink alcohol if: ? Your health care provider tells you not to drink. ? You are pregnant, may be pregnant, or are planning to become pregnant.  If you drink alcohol: ? Do not drink on an empty stomach. ? Limit how much you use to:  0-1 drink a day for women.  0-2 drinks a day for men. ? Be aware of how much alcohol is in your drink. In the [place] one drink equals one 12 oz bottle of beer (355 mL), one 5 oz glass of wine (148 mL), or one 1 oz glass of hard liquor (44 mL). ? Keep yourself hydrated with water, diet soda, or unsweetened iced tea.  Keep in mind that regular soda, juice, and other mixers may contain a lot of sugar and must be counted as carbs. What are tips for following this plan? Reading food labels  Start by checking the serving size on the "Nutrition Facts" label of packaged foods and drinks. The amount of calories, carbs, fats, and other nutrients listed on the label is based on one serving of the item. Many items contain more than one serving per package.  Check the total grams (g) of carbs in one serving. You can calculate the number of servings of carbs in one serving by dividing the total carbs by 15. For example, if a food has 30 g of total carbs per serving, it would be equal to 2 servings of carbs.  Check the number of grams (g) of saturated fats and trans fats in one serving. Choose foods that have   a low amount or none of these fats.  Check the number of milligrams (mg) of salt (sodium) in one serving. Most people should limit total sodium intake to less than 2,300 mg per day.  Always check the nutrition information of foods labeled as "low-fat" or "nonfat." These foods may be higher in added sugar or refined carbs and should be avoided.  Talk to your dietitian to identify your daily goals for nutrients listed on the label. Shopping  Avoid buying canned, pre-made, or processed foods. These foods tend to be high in fat, sodium, and added  sugar.  Shop around the outside edge of the grocery store. This is where you will most often find fresh fruits and vegetables, bulk grains, fresh meats, and fresh dairy. Cooking  Use low-heat cooking methods, such as baking, instead of high-heat cooking methods like deep frying.  Cook using healthy oils, such as olive, canola, or sunflower oil.  Avoid cooking with butter, cream, or high-fat meats. Meal planning  Eat meals and snacks regularly, preferably at the same times every day. Avoid going long periods of time without eating.  Eat foods that are high in fiber, such as fresh fruits, vegetables, beans, and whole grains. Talk with your dietitian about how many servings of carbs you can eat at each meal.  Eat 4-6 oz (112-168 g) of lean protein each day, such as lean meat, chicken, fish, eggs, or tofu. One ounce (oz) of lean protein is equal to: ? 1 oz (28 g) of meat, chicken, or fish. ? 1 egg. ?  cup (62 g) of tofu.  Eat some foods each day that contain healthy fats, such as avocado, nuts, seeds, and fish.   What foods should I eat? Fruits Berries. Apples. Oranges. Peaches. Apricots. Plums. Grapes. Mango. Papaya. Pomegranate. Kiwi. Cherries. Vegetables Lettuce. Spinach. Leafy greens, including kale, chard, collard greens, and mustard greens. Beets. Cauliflower. Cabbage. Broccoli. Carrots. Green beans. Tomatoes. Peppers. Onions. Cucumbers. Brussels sprouts. Grains Whole grains, such as whole-wheat or whole-grain bread, crackers, tortillas, cereal, and pasta. Unsweetened oatmeal. Quinoa. Brown or wild rice. Meats and other proteins Seafood. Poultry without skin. Lean cuts of poultry and beef. Tofu. Nuts. Seeds. Dairy Low-fat or fat-free dairy products such as milk, yogurt, and cheese. The items listed above may not be a complete list of foods and beverages you can eat. Contact a dietitian for more information. What foods should I avoid? Fruits Fruits canned with  syrup. Vegetables Canned vegetables. Frozen vegetables with butter or cream sauce. Grains Refined white flour and flour products such as bread, pasta, snack foods, and cereals. Avoid all processed foods. Meats and other proteins Fatty cuts of meat. Poultry with skin. Breaded or fried meats. Processed meat. Avoid saturated fats. Dairy Full-fat yogurt, cheese, or milk. Beverages Sweetened drinks, such as soda or iced tea. The items listed above may not be a complete list of foods and beverages you should avoid. Contact a dietitian for more information. Questions to ask a health care provider  Do I need to meet with a diabetes educator?  Do I need to meet with a dietitian?  What number can I call if I have questions?  When are the best times to check my blood glucose? Where to find more information:  American Diabetes Association: diabetes.org  Academy of Nutrition and Dietetics: www.eatright.org  National Institute of Diabetes and Digestive and Kidney Diseases: www.niddk.nih.gov  Association of Diabetes Care and Education Specialists: www.diabeteseducator.org Summary  It is important to have healthy eating   habits because your blood sugar (glucose) levels are greatly affected by what you eat and drink.  A healthy meal plan will help you control your blood glucose and maintain a healthy lifestyle.  Your health care provider may recommend that you work with a dietitian to make a meal plan that is best for you.  Keep in mind that carbohydrates (carbs) and alcohol have immediate effects on your blood glucose levels. It is important to count carbs and to use alcohol carefully. This information is not intended to replace advice given to you by your health care provider. Make sure you discuss any questions you have with your health care provider. Document Revised: 09/14/2019 Document Reviewed: 09/14/2019 Elsevier Patient Education  2021 Elsevier Inc.  

## 2021-05-18 ENCOUNTER — Encounter: Payer: Self-pay | Admitting: Family Medicine

## 2021-06-01 NOTE — Telephone Encounter (Signed)
I have signed and completed everything that was in my box

## 2021-07-23 ENCOUNTER — Telehealth: Payer: Self-pay | Admitting: Family Medicine

## 2021-07-23 MED ORDER — EMPAGLIFLOZIN 10 MG PO TABS: 10.0000 mg | ORAL_TABLET | Freq: Every day | ORAL | 1 refills | Status: DC

## 2021-07-23 NOTE — Telephone Encounter (Signed)
[facility] faxed refill request for the following medications:  empagliflozin (JARDIANCE) 10 MG TABS tablet   Please advise.  

## 2021-07-26 ENCOUNTER — Other Ambulatory Visit: Payer: Self-pay

## 2021-07-26 ENCOUNTER — Emergency Department
Admission: EM | Admit: 2021-07-26 | Discharge: 2021-07-26 | Disposition: A | Discharge status: Home / Self Care | Payer: Managed Care, Other (non HMO) | Attending: Emergency Medicine | Admitting: Emergency Medicine

## 2021-07-26 ENCOUNTER — Ambulatory Visit: Payer: Self-pay | Admitting: *Deleted

## 2021-07-26 DIAGNOSIS — E119 Type 2 diabetes mellitus without complications: Secondary | ICD-10-CM | POA: Insufficient documentation

## 2021-07-26 DIAGNOSIS — T7840XA Allergy, unspecified, initial encounter: Secondary | ICD-10-CM

## 2021-07-26 DIAGNOSIS — Z7984 Long term (current) use of oral hypoglycemic drugs: Secondary | ICD-10-CM | POA: Diagnosis not present

## 2021-07-26 DIAGNOSIS — Z7951 Long term (current) use of inhaled steroids: Secondary | ICD-10-CM | POA: Insufficient documentation

## 2021-07-26 DIAGNOSIS — J45909 Unspecified asthma, uncomplicated: Secondary | ICD-10-CM | POA: Diagnosis not present

## 2021-07-26 DIAGNOSIS — Z87891 Personal history of nicotine dependence: Secondary | ICD-10-CM | POA: Diagnosis not present

## 2021-07-26 DIAGNOSIS — T782XXA Anaphylactic shock, unspecified, initial encounter: Secondary | ICD-10-CM | POA: Diagnosis not present

## 2021-07-26 LAB — CBC WITH DIFFERENTIAL/PLATELET
Abs Immature Granulocytes: 0.07 10*3/uL (ref 0.00–0.07)
Basophils Absolute: 0 10*3/uL (ref 0.0–0.1)
Basophils Relative: 0 %
Eosinophils Absolute: 0.1 10*3/uL (ref 0.0–0.5)
Eosinophils Relative: 1 %
HCT: 44.1 % (ref 36.0–46.0)
Hemoglobin: 14.6 g/dL (ref 12.0–15.0)
Immature Granulocytes: 1 %
Lymphocytes Relative: 40 %
Lymphs Abs: 4 10*3/uL (ref 0.7–4.0)
MCH: 27 pg (ref 26.0–34.0)
MCHC: 33.1 g/dL (ref 30.0–36.0)
MCV: 81.5 fL (ref 80.0–100.0)
Monocytes Absolute: 0.6 10*3/uL (ref 0.1–1.0)
Monocytes Relative: 6 %
Neutro Abs: 5.3 10*3/uL (ref 1.7–7.7)
Neutrophils Relative %: 52 %
Platelets: 288 10*3/uL (ref 150–400)
RBC: 5.41 MIL/uL — ABNORMAL HIGH (ref 3.87–5.11)
RDW: 14 % (ref 11.5–15.5)
WBC: 10 10*3/uL (ref 4.0–10.5)
nRBC: 0 % (ref 0.0–0.2)

## 2021-07-26 LAB — COMPREHENSIVE METABOLIC PANEL
ALT: 20 U/L (ref 0–44)
AST: 22 U/L (ref 15–41)
Albumin: 3.8 g/dL (ref 3.5–5.0)
Alkaline Phosphatase: 81 U/L (ref 38–126)
Anion gap: 13 (ref 5–15)
BUN: 16 mg/dL (ref 6–20)
CO2: 23 mmol/L (ref 22–32)
Calcium: 9 mg/dL (ref 8.9–10.3)
Chloride: 101 mmol/L (ref 98–111)
Creatinine, Ser: 0.85 mg/dL (ref 0.44–1.00)
GFR, Estimated: >60 mL/min (ref >60)
Glucose, Bld: 131 mg/dL — ABNORMAL HIGH (ref 70–99)
Potassium: 3.7 mmol/L (ref 3.5–5.1)
Sodium: 137 mmol/L (ref 135–145)
Total Bilirubin: 0.5 mg/dL (ref 0.3–1.2)
Total Protein: 6.8 g/dL (ref 6.5–8.1)

## 2021-07-26 LAB — TROPONIN I (HIGH SENSITIVITY): Troponin I (High Sensitivity): <2 ng/L (ref <18)

## 2021-07-26 MED ORDER — EPINEPHRINE 0.3 MG/0.3ML IJ SOAJ: 0.3000 mg | INTRAMUSCULAR | 1 refills | Status: AC | PRN

## 2021-07-26 MED ORDER — METHYLPREDNISOLONE SODIUM SUCC 125 MG IJ SOLR
125.0000 mg | Freq: Once | INTRAMUSCULAR | Status: AC
Start: 2021-07-26 — End: 2021-07-26
  Administered 2021-07-26: 125 mg via INTRAVENOUS
  Filled 2021-07-26: qty 2

## 2021-07-26 MED ORDER — FAMOTIDINE IN NACL 20-0.9 MG/50ML-% IV SOLN
20.0000 mg | Freq: Once | INTRAVENOUS | Status: AC
Start: 2021-07-26 — End: 2021-07-26
  Administered 2021-07-26: 20 mg via INTRAVENOUS
  Filled 2021-07-26: qty 50

## 2021-07-26 MED ORDER — EPINEPHRINE 0.3 MG/0.3ML IJ SOAJ
0.3000 mg | Freq: Once | INTRAMUSCULAR | Status: AC
Start: 2021-07-26 — End: 2021-07-26
  Administered 2021-07-26: 0.3 mg via INTRAMUSCULAR
  Filled 2021-07-26: qty 0.3

## 2021-07-26 MED ORDER — DIPHENHYDRAMINE HCL 25 MG PO CAPS
25.0000 mg | ORAL_CAPSULE | Freq: Once | ORAL | Status: AC
Start: 2021-07-26 — End: 2021-07-26
  Administered 2021-07-26: 25 mg via ORAL
  Filled 2021-07-26: qty 1

## 2021-07-26 MED ORDER — PREDNISONE 10 MG PO TABS: 20.0000 mg | ORAL_TABLET | Freq: Every day | ORAL | 0 refills | Status: AC

## 2021-07-26 NOTE — ED Provider Notes (Signed)
Procedures     ----------------------------------------- 6:31 PM on 07/26/2021 -----------------------------------------  Patient remains asymptomatic, vital signs are normal.  She is eager to be discharged and is stable.  No evidence of persistent histamine or hypersensitivity reaction.  Prescription sent by Dr. Alfred Levins.    Sharman Cheek, MD 07/26/21 [phone]

## 2021-07-26 NOTE — Telephone Encounter (Signed)
C/o allergic reaction "to something" after eating lunch. Hives all over , tongue swelling, and facial swelling. Able to talk with out difficulty . No c/o breathing issues at this time. Sent patient to ED now. Patient reports she took 2 allegra pills 30 minutes ago  when symptoms started. Patient verbalized understanding and to call 911 if symptoms worsen.

## 2021-07-26 NOTE — ED Notes (Signed)
Pt expresses that she is feeling better. Pt sts that her tongue is not feeling as swollen and it is easier for here to breath. Pt also has minimal hives as this time as well.

## 2021-07-26 NOTE — ED Provider Notes (Signed)
[facility] Emergency Department Provider Note  ____________________________________________   Event Date/Time   First MD Initiated Contact with Patient 07/26/21 1313     (approximate)  I have reviewed the triage vital signs and the nursing notes.   HISTORY  Chief Complaint Allergic Reaction    HPI Nancy Blake is a 55 y.o. female with diabetes who comes in with concerns for allergic reaction.  Patient reports eating something and states that is what she normally eats but then she developed sudden onset of hives and feeling like her throat was swelling and it was heavier to breathe, constant, nothing made it better or worse.  She reports some shortness of breath but denies any chest pain.  She denies this ever happening previously.  Denies a history of severe allergic reaction.  Does report some seasonal allergies.  He is not on any lisinopril and has not had any medication changes.          Past Medical History:  Diagnosis Date   Allergic rhinitis    Asthma    CPAP (continuous positive airway pressure) dependence    Diabetes mellitus without complication (HCC)    Pneumonia    Sleep apnea     Patient Active Problem List   Diagnosis Date Noted   COVID-19 vaccination declined 12/26/2020   Vitamin D deficiency 04/10/2020   Long-term use of high-risk medication 04/10/2020   OSA (obstructive sleep apnea) 12/03/2018   Morbid obesity (HCC) 12/03/2018   Hyperlipidemia associated with type 2 diabetes mellitus (HCC) 12/03/2018   Menopausal symptoms 04/13/2018   Chronic bilateral low back pain without sciatica 04/13/2018   Rectal polyp    Diabetes (HCC) 12/08/2015   Asthma 10/14/2015   Allergic rhinitis 04/01/2009   Acid reflux 03/31/2009    Past Surgical History:  Procedure Laterality Date   ABDOMINAL HYSTERECTOMY     APPENDECTOMY     COLONOSCOPY WITH PROPOFOL N/A 03/19/2016   Procedure: COLONOSCOPY WITH PROPOFOL;  Surgeon: Midge Minium,  MD;  Location: [facility] ENDOSCOPY;  Service: Endoscopy;  Laterality: N/A;   TUBAL LIGATION  1995    Prior to Admission medications   Medication Sig Start Date End Date Taking? Authorizing Provider  acetaminophen (TYLENOL) 500 MG tablet Take 500 mg by mouth every 6 (six) hours as needed.    [provider]  albuterol (VENTOLIN HFA) 108 (90 Base) MCG/ACT inhaler Inhale 2 puffs into the lungs every 6 (six) hours as needed for wheezing or shortness of breath. 07/24/20   Erin Fulling, MD  atorvastatin (LIPITOR) 10 MG tablet TAKE 1 TABLET(10 MG) BY MOUTH DAILY 01/15/21   Erasmo Downer, MD  cetirizine (ZYRTEC) 10 MG tablet TAKE 1 TABLET BY MOUTH EVERY DAY 10/06/20   Erin Fulling, MD  cholecalciferol (VITAMIN D3) 25 MCG (1000 UNIT) tablet Take 1,000 Units by mouth daily.    [provider]  clotrimazole-betamethasone (LOTRISONE) cream Apply 1 application topically 2 (two) times daily. 12/26/20   Erasmo Downer, MD  Continuous Blood Gluc Receiver (FREESTYLE LIBRE 2 READER) DEVI Use to monitor glucose continuously 04/09/21   Erasmo Downer, MD  Continuous Blood Gluc Sensor (FREESTYLE LIBRE 2 SENSOR) MISC Use to monitor glucose continuously. Change sensor every 14 days 04/09/21   Erasmo Downer, MD  empagliflozin (JARDIANCE) 10 MG TABS tablet Take 1 tablet (10 mg total) by mouth daily before breakfast. 07/23/21   Bacigalupo, Marzella Schlein, MD  glucose blood test strip Use as instructed to test blood glucose  daily 08/08/20   Erasmo Downer, MD  ibuprofen (ADVIL,MOTRIN) 200 MG tablet Take 600 mg by mouth every 6 (six) hours as needed for mild pain or moderate pain.    [provider]  ipratropium-albuterol (DUONEB) 0.5-2.5 (3) MG/3ML SOLN Take 3 mLs by nebulization every 4 (four) hours as needed. 07/24/20   Erin Fulling, MD  metFORMIN (GLUCOPHAGE XR) 500 MG 24 hr tablet Take 2 tablets (1,000 mg total) by mouth daily with breakfast AND 1 tablet (500 mg total) every  evening. 09/18/20   Erasmo Downer, MD  Multiple Vitamin (MULTIVITAMIN) tablet Take 1 tablet by mouth daily.    [provider]  omeprazole (PRILOSEC) 20 MG capsule Take 20 mg by mouth daily.    [provider]    Allergies Patient has no known allergies.  Family History  Problem Relation Age of Onset   CAD Mother    Dementia Mother        Early onset at 53   Healthy Father    Healthy Brother    Osteoporosis Maternal Grandmother    COPD Maternal Grandfather    Lung cancer Paternal Grandfather    Breast cancer Neg Hx     Social History Social History   Tobacco Use   Smoking status: Former    Types: Cigarettes    Quit date: 10/21/1983    Years since quitting: 37.7   Smokeless tobacco: Never  Vaping Use   Vaping Use: Never used  Substance Use Topics   Alcohol use: No   Drug use: No      Review of Systems Constitutional: No fever/chills Eyes: No visual changes. ENT: Sore throat Cardiovascular: Denies chest pain. Respiratory: Shortness of breath Gastrointestinal: No abdominal pain.  No nausea, no vomiting.  No diarrhea.  No constipation. Genitourinary: Negative for dysuria. Musculoskeletal: Negative for back pain. Skin: Positive for rash Neurological: Negative for headaches, focal weakness or numbness. All other ROS negative ____________________________________________   PHYSICAL EXAM:  VITAL SIGNS: ED Triage Vitals  Enc Vitals Group     BP 07/26/21 1305 (!) 141/75     Pulse Rate 07/26/21 1305 87     Resp 07/26/21 1305 18     Temp 07/26/21 1327 98.9 F (37.2 C)     Temp Source 07/26/21 1305 Oral     SpO2 07/26/21 1305 97 %     Weight 07/26/21 1305 215 lb (97.5 kg)     Height 07/26/21 1305 5\' 3"  (1.6 m)     Head Circumference --      Peak Flow --      Pain Score 07/26/21 1305 0     Pain Loc --      Pain Edu? --      Excl. in GC? --     Constitutional: Alert and oriented. Well appearing and in no acute distress. Eyes:  Conjunctivae are normal. EOMI. Head: Atraumatic. Nose: No congestion/rhinnorhea. Mouth/Throat: Mucous membranes are moist.  No obvious swelling noted. Neck: No stridor. Trachea Midline. FROM Cardiovascular: Normal rate, regular rhythm. Grossly normal heart sounds.  Good peripheral circulation. Respiratory: Normal respiratory effort.  No retractions. Lungs CTAB. Gastrointestinal: Soft and nontender. No distention. No abdominal bruits.  Musculoskeletal: No lower extremity tenderness nor edema.  No joint effusions. Neurologic:  Normal speech and language. No gross focal neurologic deficits are appreciated.  Skin:  Skin is warm, dry and intact.  Urticarial rash noted mostly on her abdomen Psychiatric: Mood and affect are normal. Speech and behavior are  normal. GU: Deferred   ____________________________________________   LABS (all labs ordered are listed, but only abnormal results are displayed)  Labs Reviewed  CBC WITH DIFFERENTIAL/PLATELET - Abnormal; Notable for the following components:      Result Value   RBC 5.41 (*)    All other components within normal limits  COMPREHENSIVE METABOLIC PANEL  TROPONIN I (HIGH SENSITIVITY)   ____________________________________________   ED ECG REPORT I, Concha Se, the attending physician, personally viewed and interpreted this ECG.  Normal sinus rate of 83, no ST elevation, no T wave inversions, QTC slightly prolonged at 603 but I think it could just be difficult to read given its low amplitude  Repeat EKG looks sinus rate of 84 without any ST elevation or T wave inversions, QTC is 477. ____________________________________________   PROCEDURES  Procedure(s) performed (including Critical Care):  .Critical Care Performed by: Concha Se, MD Authorized by: Concha Se, MD   Critical care provider statement:    Critical care time (minutes):  35   Critical care was necessary to treat or prevent imminent or life-threatening  deterioration of the following conditions: anaphylasix.   Critical care was time spent personally by me on the following activities:  Discussions with consultants, evaluation of patient's response to treatment, examination of patient, ordering and performing treatments and interventions, ordering and review of laboratory studies, ordering and review of radiographic studies, pulse oximetry, re-evaluation of patient's condition, obtaining history from patient or surrogate and review of old charts   ____________________________________________   INITIAL IMPRESSION / ASSESSMENT AND PLAN / ED COURSE  Nancy Blake was evaluated in Emergency Department on 07/26/2021 for the symptoms described in the history of present illness. She was evaluated in the context of the global COVID-19 pandemic, which necessitated consideration that the patient might be at risk for infection with the SARS-CoV-2 virus that causes COVID-19. Institutional protocols and algorithms that pertain to the evaluation of patients at risk for COVID-19 are in a state of rapid change based on information released by regulatory bodies including the CDC and federal and state organizations. These policies and algorithms were followed during the patient's care in the ED.    Patient comes in for concerns for allergic reaction.  Meds were given to treat possible anaphylaxis given patient reports throat tightness although on my examination I do not see any evidence of oropharyngeal swelling but she does have a uticarial rash.  Patient does report some shortness of breath and did get some labs to rule out anemia, AKI and EKG, cardiac marker but seems less likely.  We will keep patient on the cardiac monitor to evaluate for any arrhythmia.  Patient is looking better after the medications and the rash is mostly resolved.  Patient states that she is feeling better  3:20 PM patient reevaluated denies any shortness of breath.  States that she feels  that her baseline self  Patient be handed off to oncoming team pending reassessment after the epinephrine.  Anticipate discharge home as long as she does not have a rebound reaction.  Will send home with some steroids, EpiPen and will follow-up with her primary care doctor for allergy testing.  We discussed indications for EpiPen      ____________________________________________   FINAL CLINICAL IMPRESSION(S) / ED DIAGNOSES   Final diagnoses:  Allergic reaction, initial encounter  Anaphylaxis, initial encounter      MEDICATIONS GIVEN DURING THIS VISIT:  Medications  famotidine (PEPCID) IVPB 20 mg premix (20 mg Intravenous  New Bag/Given 07/26/21 1328)  methylPREDNISolone sodium succinate (SOLU-MEDROL) 125 mg/2 mL injection 125 mg (125 mg Intravenous Given 07/26/21 1328)  EPINEPHrine (EPI-PEN) injection 0.3 mg (0.3 mg Intramuscular Given 07/26/21 1329)  diphenhydrAMINE (BENADRYL) capsule 25 mg (25 mg Oral Given 07/26/21 1328)     ED Discharge Orders     None        Note:  This document was prepared using Dragon voice recognition software and may include unintentional dictation errors.    Concha Se, MD 07/26/21 1524

## 2021-07-26 NOTE — ED Triage Notes (Addendum)
Pt states after eating lunch she started to break out into hives and feeling like her tongue and lips are swollen, states she took 2 allegra PTA Pt also states she took a teaspoon of children's benadryl

## 2021-07-26 NOTE — Telephone Encounter (Signed)
Reason for Disposition  [1] Widespread hives, itching or facial swelling AND [2] onset < 2 hours of exposure to high-risk allergen (e.g., sting, nuts, 1st dose of antibiotic)  Answer Assessment - Initial Assessment Questions 1. MAIN SYMPTOM: "What is your main symptom?" "How bad is it?"       Hives all over, tongue swelling, face swelling  2. RESPIRATORY STATUS: "Are you having difficulty breathing?"  (e.g., yes/no, wheezing, unable to complete a sentence)      Na . Able to talk without difficulty  3. SWALLOWING: "Can you swallow?" (e.g., yes/no, food, fluid, saliva)      Ye s 4. VASCULAR STATUS: "Are you feeling weak?" If Yes, ask: "Can you stand and walk normally?"     Na  5. ONSET: "When did the reaction start?" (Minutes or hours ago)      Greater than 30 minutes ago  6. SUBSTANCE: "What are you reacting to?" "When did the contact occur?"      Not sure  7. PREVIOUS REACTION: "Have you ever reacted to it before?" If Yes, ask: "What happened that time?"     na 8. EPINEPHRINE: "Do you have an epinephrine autoinjector (e.g., EpiPen)?"     na  Protocols used: Anaphylaxis-A-AH

## 2021-07-26 NOTE — Discharge Instructions (Addendum)
Discussed with your primary care doctor about seeing a allergy doctor given your not sure what this was caused by.  Return to the ER if you have recurrent anaphylaxis.  You can use Benadryl at nighttime and some Allegra during the daytime to help with any itching

## 2021-07-30 ENCOUNTER — Telehealth: Payer: Self-pay | Admitting: Family Medicine

## 2021-07-30 DIAGNOSIS — Z91018 Allergy to other foods: Secondary | ICD-10-CM

## 2021-07-30 NOTE — Telephone Encounter (Signed)
Pt is calling because she was hospitalized with food allergies on 07/26/21. Pt was calling to get an appt with Dr. B for allergy testing. Pt was advised that an appt would be necessary and was offered  appt first available with Dr. B in 12/22 . Pt was offered appt with Dr. Sherrie Mustache for next Friday. Pt declined.  Pt wanted to see if Dr. Leonard Schwartz could sent referral to allergist.   [phone]

## 2021-08-03 NOTE — Telephone Encounter (Signed)
Ok to send referral as requested 

## 2021-10-17 ENCOUNTER — Other Ambulatory Visit: Payer: Self-pay

## 2021-10-17 ENCOUNTER — Telehealth: Payer: Self-pay | Admitting: Family Medicine

## 2021-10-17 MED ORDER — METFORMIN HCL ER 500 MG PO TB24: ORAL_TABLET | ORAL | 0 refills | Status: DC

## 2021-10-17 NOTE — Telephone Encounter (Signed)
[facility] faxed refill request for the following medications:  metFORMIN (GLUCOPHAGE-XR) 500 MG 24 hr tablet   Please advise.  

## 2021-10-23 ENCOUNTER — Other Ambulatory Visit: Payer: Self-pay | Admitting: Family Medicine

## 2021-10-23 NOTE — Telephone Encounter (Signed)
Copied from [place] [id number]. Topic: Quick Communication - Rx Refill/Question >> Oct 23, 2021  2:24 PM Leward Quan A wrote: Medication: atorvastatin (LIPITOR) 10 MG tablet   Has the patient contacted their pharmacy? Yes.  Told to call the pharmacy  (Agent: If no, request that the patient contact the pharmacy for the refill. If patient does not wish to contact the pharmacy document the reason why and proceed with request.) (Agent: If yes, when and what did the pharmacy advise?)  Preferred Pharmacy (with phone number or street name): [facility] [id number], [place]  Phone:  [phone] Fax:  [phone]    Has the patient been seen for an appointment in the last year OR does the patient have an upcoming appointment? Yes.    Agent: Please be advised that RX refills may take up to 3 business days. We ask that you follow-up with your pharmacy.

## 2021-10-24 MED ORDER — ATORVASTATIN CALCIUM 10 MG PO TABS: ORAL_TABLET | ORAL | 0 refills | Status: DC

## 2021-10-24 NOTE — Telephone Encounter (Signed)
Future OV 01/07/22.  Approved per protocol. Requested Prescriptions  Pending Prescriptions Disp Refills   atorvastatin (LIPITOR) 10 MG tablet 90 tablet 0    Sig: TAKE 1 TABLET(10 MG) BY MOUTH DAILY Strength: 10 mg     Cardiovascular:  Antilipid - Statins Passed - 10/23/2021  6:50 PM      Passed - Total Cholesterol in normal range and within 360 days    Cholesterol, Total  Date Value Ref Range Status  12/27/2020 139 100 - 199 mg/dL Final         Passed - LDL in normal range and within 360 days    LDL Chol Calc (NIH)  Date Value Ref Range Status  12/27/2020 76 0 - 99 mg/dL Final         Passed - HDL in normal range and within 360 days    HDL  Date Value Ref Range Status  12/27/2020 48 >39 mg/dL Final         Passed - Triglycerides in normal range and within 360 days    Triglycerides  Date Value Ref Range Status  12/27/2020 78 0 - 149 mg/dL Final         Passed - Patient is not pregnant      Passed - Valid encounter within last 12 months    Recent Outpatient Visits          6 months ago Type 2 diabetes mellitus with other specified complication, without long-term current use of insulin ([place])   [facility] [place], Dionne Bucy, MD   10 months ago Encounter for annual physical exam   [facility] [place], Dionne Bucy, MD   11 months ago [place], Dionne Bucy, MD   1 year ago Type 2 diabetes mellitus with other specified complication, without long-term current use of insulin [facility])   [facility] [place], Dionne Bucy, MD   1 year ago Mild intermittent asthma with acute exacerbation   [facility] Trin[place] Post, [place]      Future Appointments            In 2 months Bacigalupo, Dionne Bucy, MD [facility], [place]

## 2021-10-26 ENCOUNTER — Other Ambulatory Visit: Payer: Self-pay | Admitting: Family Medicine

## 2021-10-27 NOTE — Telephone Encounter (Signed)
Medication initially printed- resending electronically  Requested Prescriptions  Pending Prescriptions Disp Refills   atorvastatin (LIPITOR) 10 MG tablet [Pharmacy Med Name: ATORVASTATIN 10MG  TABLETS] 90 tablet 0    Sig: TAKE 1 TABLET(10 MG) BY MOUTH DAILY     Cardiovascular:  Antilipid - Statins Passed - 10/26/2021  7:20 PM      Passed - Total Cholesterol in normal range and within 360 days    Cholesterol, Total  Date Value Ref Range Status  12/27/2020 139 100 - 199 mg/dL Final          Passed - LDL in normal range and within 360 days    LDL Chol Calc (NIH)  Date Value Ref Range Status  12/27/2020 76 0 - 99 mg/dL Final          Passed - HDL in normal range and within 360 days    HDL  Date Value Ref Range Status  12/27/2020 48 >39 mg/dL Final          Passed - Triglycerides in normal range and within 360 days    Triglycerides  Date Value Ref Range Status  12/27/2020 78 0 - 149 mg/dL Final          Passed - Patient is not pregnant      Passed - Valid encounter within last 12 months    Recent Outpatient Visits           6 months ago Type 2 diabetes mellitus with other specified complication, without long-term current use of insulin (HCC)   [facility] [place], [place], MD   10 months ago Encounter for annual physical exam   [facility] [place], [place], MD   11 months ago COVID-19   [facility], [facility], MD   1 year ago Type 2 diabetes mellitus with other specified complication, without long-term current use of insulin [facility])   [facility] [place], [place], MD   1 year ago Mild intermittent asthma with acute exacerbation   [facility], Trey Sailors       Future Appointments             In 2 months Bacigalupo, [place], MD [facility], [facility]

## 2021-10-30 ENCOUNTER — Telehealth: Payer: Self-pay

## 2021-10-30 MED ORDER — METFORMIN HCL ER 500 MG PO TB24: ORAL_TABLET | ORAL | 0 refills | Status: DC

## 2021-10-30 NOTE — Telephone Encounter (Signed)
Copied from CRM [id number]. Topic: General - Other >> Oct 30, 2021  2:48 PM Marylen Ponto wrote: Reason for CRM: Pt stated her insurance will only pay for a 90 day supply so she needs the Rx for metFORMIN (GLUCOPHAGE XR) 500 MG 24 hr tablet to sent in to her pharmacy for 90 day supply. Pt stated she only has enough medication for 2 days.

## 2021-10-31 ENCOUNTER — Other Ambulatory Visit: Payer: Self-pay | Admitting: Family Medicine

## 2021-10-31 DIAGNOSIS — E119 Type 2 diabetes mellitus without complications: Secondary | ICD-10-CM

## 2021-10-31 NOTE — Telephone Encounter (Signed)
Requested medications are due for refill today.  unsure  Requested medications are on the active medications list.  no  Last refill. 07/29/2020  Future visit scheduled.   yes  Notes to clinic.  It appears that pt does not use these strips any more.    Requested Prescriptions  Pending Prescriptions Disp Refills   ONETOUCH VERIO test strip [Pharmacy Med Name: ONE TOUCH VERIO TEST ST(NEW)100S] 100 strip     Sig: TEST BLOOD SUGAR ONCE DAILY     Endocrinology: Diabetes - Testing Supplies Passed - 10/31/2021  3:39 AM      Passed - Valid encounter within last 12 months    Recent Outpatient Visits           6 months ago Type 2 diabetes mellitus with other specified complication, without long-term current use of insulin [facility])   [facility], Marzella Schlein, MD   10 months ago Encounter for annual physical exam   [facility] [place], Marzella Schlein, MD   11 months ago COVID-19   [facility], Marzella Schlein, MD   1 year ago Type 2 diabetes mellitus with other specified complication, without long-term current use of insulin [facility])   [facility], Marzella Schlein, MD   1 year ago Mild intermittent asthma with acute exacerbation   [facility] Trey Sailors, [place]       Future Appointments             In 2 months Bacigalupo, Marzella Schlein, MD [facility], [facility]

## 2021-11-15 ENCOUNTER — Ambulatory Visit: Payer: Self-pay | Admitting: Family Medicine

## 2022-01-02 ENCOUNTER — Other Ambulatory Visit: Payer: Self-pay | Admitting: Family Medicine

## 2022-01-02 DIAGNOSIS — Z1231 Encounter for screening mammogram for malignant neoplasm of breast: Secondary | ICD-10-CM

## 2022-01-04 NOTE — Progress Notes (Signed)
?  ? ?I,Sulibeya S Dimas,acting as a scribe for [name], MD.,have documented all relevant documentation on the behalf of [name], MD,as directed by  [name], MD while in the presence of [name], MD. ? ? ?Established patient visit ? ? ?Patient: Nancy Blake   DOB: 06/22/1966   56 y.o. Female  MRN: [id number] ?Visit Date: 01/07/2022 ? ?Today's healthcare provider: [name], MD  ? ?Chief Complaint  ?Patient presents with  ? Diabetes  ? Hyperlipidemia  ? ?Subjective  ?  ?HPI  ?Diabetes Mellitus Type II, follow-up ? ?Lab Results  ?Component Value Date  ? HGBA1C 6.3 (A) 01/07/2022  ? HGBA1C 6.6 (A) 04/09/2021  ? HGBA1C 7.2 (H) 12/27/2020  ? ?Last seen for diabetes 9 months ago.  ?Management since then includes continuing the same treatment. ?She reports excellent compliance with treatment. ?She is not having side effects.  ? ?Home blood sugar records: fasting range: 100-120s ? ?Episodes of hypoglycemia? No  ?  ?Current insulin regiment: none ?Most Recent Eye Exam: Dr. Matilde Sprang 07/11/20 ? ?--------------------------------------------------------------------------------------------------- ?Lipid/Cholesterol, follow-up ? ?Last Lipid Panel: ?Lab Results  ?Component Value Date  ? CHOL 139 12/27/2020  ? [place] 76 12/27/2020  ? HDL 48 12/27/2020  ? TRIG 78 12/27/2020  ? ? ?She was last seen for this 9 months ago.  ?Management since that visit includes no changes. ? ?She reports excellent compliance with treatment. ?She is not having side effects.  ? ?Symptoms: ?No appetite changes No foot ulcerations  ?No chest pain No chest pressure/discomfort  ?No dyspnea No orthopnea  ?No fatigue No lower extremity edema  ?No palpitations No paroxysmal nocturnal dyspnea  ?No nausea No numbness or tingling of extremity  ?No polydipsia No polyuria  ?No speech difficulty No syncope  ? ?She is following a Regular diet. ?Current exercise: walking ? ?Last metabolic panel ?Lab Results  ?Component  Value Date  ? GLUCOSE 131 (H) 07/26/2021  ? NA 137 07/26/2021  ? K 3.7 07/26/2021  ? BUN 16 07/26/2021  ? CREATININE 0.85 07/26/2021  ? EGFR 82 12/27/2020  ? GFRNONAA >60 07/26/2021  ? CALCIUM 9.0 07/26/2021  ? AST 22 07/26/2021  ? ALT 20 07/26/2021  ? ?The 10-year ASCVD risk score (Arnett DK, et al., 2019) is: 2.4% ? ?--------------------------------------------------------------------------------------------------- ? ? ?Medications: ?Outpatient Medications Prior to Visit  ?Medication Sig  ? acetaminophen (TYLENOL) 500 MG tablet Take 500 mg by mouth every 6 (six) hours as needed.  ? albuterol (VENTOLIN HFA) 108 (90 Base) MCG/ACT inhaler Inhale 2 puffs into the lungs every 6 (six) hours as needed for wheezing or shortness of breath.  ? atorvastatin (LIPITOR) 10 MG tablet TAKE 1 TABLET(10 MG) BY MOUTH DAILY  ? cetirizine (ZYRTEC) 10 MG tablet TAKE 1 TABLET BY MOUTH EVERY DAY  ? cholecalciferol (VITAMIN D3) 25 MCG (1000 UNIT) tablet Take 1,000 Units by mouth daily.  ? clotrimazole-betamethasone (LOTRISONE) cream Apply 1 application topically 2 (two) times daily.  ? Continuous Blood Gluc Receiver (FREESTYLE LIBRE 2 READER) DEVI Use to monitor glucose continuously  ? Continuous Blood Gluc Sensor (FREESTYLE LIBRE 2 SENSOR) MISC Use to monitor glucose continuously. Change sensor every 14 days  ? empagliflozin (JARDIANCE) 10 MG TABS tablet Take 1 tablet (10 mg total) by mouth daily before breakfast.  ? ibuprofen (ADVIL,MOTRIN) 200 MG tablet Take 600 mg by mouth every 6 (six) hours as needed for mild pain or moderate pain.  ? ipratropium-albuterol (DUONEB) 0.5-2.5 (3) MG/3ML SOLN Take 3 mLs by nebulization every  4 (four) hours as needed.  ? metFORMIN (GLUCOPHAGE XR) 500 MG 24 hr tablet Take 2 tablets (1,000 mg total) by mouth daily with breakfast AND 1 tablet (500 mg total) every evening.  ? Multiple Vitamin (MULTIVITAMIN) tablet Take 1 tablet by mouth daily.  ? omeprazole (PRILOSEC) 20 MG capsule Take 20 mg by mouth daily.   ? ONETOUCH VERIO test strip TEST BLOOD SUGAR ONCE DAILY  ? EPINEPHrine 0.3 mg/0.3 mL IJ SOAJ injection Inject into the muscle.  ? ?No facility-administered medications prior to visit.  ? ? ?Review of Systems  ?Constitutional:  Negative for activity change, appetite change and fatigue.  ?Respiratory:  Negative for chest tightness and shortness of breath.   ?Cardiovascular:  Negative for chest pain.  ? ?  ?  Objective  ?  ?BP 108/75 (BP Location: Left Arm, Patient Position: Sitting, Cuff Size: Large)   Pulse 78   Temp 97.6 ?F (36.4 ?C) (Temporal)   Resp 16   Ht '5\' 4"'  (1.626 m)   Wt 219 lb (99.3 kg)   BMI 37.59 kg/m?  ?BP Readings from Last 3 Encounters:  ?01/07/22 108/75  ?07/26/21 (!) 146/70  ?04/09/21 123/85  ? ?Wt Readings from Last 3 Encounters:  ?01/07/22 219 lb (99.3 kg)  ?07/26/21 215 lb (97.5 kg)  ?04/09/21 219 lb (99.3 kg)  ? ?  ? ?Physical Exam ?Vitals reviewed.  ?Constitutional:   ?   General: She is not in acute distress. ?   Appearance: Normal appearance. She is well-developed. She is not diaphoretic.  ?HENT:  ?   Head: Normocephalic and atraumatic.  ?Eyes:  ?   General: No scleral icterus. ?   Conjunctiva/sclera: Conjunctivae normal.  ?Neck:  ?   Thyroid: No thyromegaly.  ?Cardiovascular:  ?   Rate and Rhythm: Normal rate and regular rhythm.  ?   Pulses: Normal pulses.  ?   Heart sounds: Normal heart sounds. No murmur heard. ?Pulmonary:  ?   Effort: Pulmonary effort is normal. No respiratory distress.  ?   Breath sounds: Normal breath sounds. No wheezing, rhonchi or rales.  ?Musculoskeletal:  ?   Cervical back: Neck supple.  ?   Right lower leg: No edema.  ?   Left lower leg: No edema.  ?Lymphadenopathy:  ?   Cervical: No cervical adenopathy.  ?Skin: ?   General: Skin is warm and dry.  ?   Findings: No rash.  ?Neurological:  ?   Mental Status: She is alert and oriented to person, place, and time. Mental status is at baseline.  ?Psychiatric:     ?   Mood and Affect: Mood normal.     ?   Behavior:  Behavior normal.  ?  ?Diabetic Foot Exam - Simple   ?Simple Foot Form ?Diabetic Foot exam was performed with the following findings: Yes 01/07/2022  8:26 AM  ?Visual Inspection ?No deformities, no ulcerations, no other skin breakdown bilaterally: Yes ?Sensation Testing ?Intact to touch and monofilament testing bilaterally: Yes ?Pulse Check ?Posterior Tibialis and Dorsalis pulse intact bilaterally: Yes ?Comments ?  ?  ? ?Results for orders placed or performed in visit on 01/07/22  ?POCT glycosylated hemoglobin (Hb A1C)  ?Result Value Ref Range  ? Hemoglobin A1C 6.3 (A) 4.0 - 5.6 %  ? Est. average glucose Bld gHb Est-mCnc 134   ? ? Assessment & Plan  ?  ? ?Problem List Items Addressed This Visit   ? ?  ? Respiratory  ? OSA (obstructive sleep apnea)  ?  Well controlled ?F/b Pulm ?Continue CPAP ?  ?  ?  ? Endocrine  ? Diabetes ([place]) - Primary  ?  Well controlled with A1c 6.3 today ?Continue current medications ?UTD on vaccines,  ?Upcoming eye exam ?foot exam today ?uACR today ?On Statin ?Discussed diet and exercise ?F/u in 6 months  ?  ?  ? Relevant Orders  ? POCT glycosylated hemoglobin (Hb A1C) (Completed)  ? Urine Microalbumin w/creat. ratio  ? Hyperlipidemia associated with type 2 diabetes mellitus ([place])  ?  Previously well controlled ?Continue statin ?Repeat FLP and CMP ?Goal LDL < 70 ?  ?  ? Relevant Medications  ? EPINEPHrine 0.3 mg/0.3 mL IJ SOAJ injection  ? Other Relevant Orders  ? Comprehensive metabolic panel  ? Lipid panel  ?  ? Other  ? Morbid obesity ([place])  ?  BMI 37 and assoc with DM, OSA, HLD ?Discussed importance of healthy weight management ?Discussed diet and exercise  ?  ?  ?  ? ?Return in about 3 months (around 04/09/2022) for CPE, as scheduled.  ?   ? ?I, [name], MD, have reviewed all documentation for this visit. The documentation on 01/07/22 for the exam, diagnosis, procedures, and orders are all accurate and complete. ? ? ?Virginia Crews, MD, MPH ?[place] ?[facility]   ?

## 2022-01-07 ENCOUNTER — Encounter: Payer: Self-pay | Admitting: Family Medicine

## 2022-01-07 ENCOUNTER — Ambulatory Visit: Payer: Managed Care, Other (non HMO) | Admitting: Family Medicine

## 2022-01-07 ENCOUNTER — Other Ambulatory Visit: Payer: Self-pay

## 2022-01-07 VITALS — BP 108/75 | HR 78 | Temp 97.6°F | Resp 16 | Ht 64.0 in | Wt 219.0 lb

## 2022-01-07 DIAGNOSIS — E1169 Type 2 diabetes mellitus with other specified complication: Secondary | ICD-10-CM | POA: Diagnosis not present

## 2022-01-07 DIAGNOSIS — G4733 Obstructive sleep apnea (adult) (pediatric): Secondary | ICD-10-CM

## 2022-01-07 DIAGNOSIS — E785 Hyperlipidemia, unspecified: Secondary | ICD-10-CM

## 2022-01-07 LAB — POCT GLYCOSYLATED HEMOGLOBIN (HGB A1C)
Est. average glucose Bld gHb Est-mCnc: 134
Hemoglobin A1C: 6.3 % — AB (ref 4.0–5.6)

## 2022-01-07 NOTE — Assessment & Plan Note (Signed)
Well controlled ?F/b Pulm ?Continue CPAP ?

## 2022-01-07 NOTE — Assessment & Plan Note (Signed)
BMI 37 and assoc with DM, OSA, HLD ?Discussed importance of healthy weight management ?Discussed diet and exercise  ?

## 2022-01-07 NOTE — Assessment & Plan Note (Signed)
Previously well controlled Continue statin Repeat FLP and CMP Goal LDL < 70 

## 2022-01-07 NOTE — Assessment & Plan Note (Signed)
Well controlled with A1c 6.3 today ?Continue current medications ?UTD on vaccines,  ?Upcoming eye exam ?foot exam today ?uACR today ?On Statin ?Discussed diet and exercise ?F/u in 6 months  ?

## 2022-01-08 LAB — COMPREHENSIVE METABOLIC PANEL
ALT: 22 IU/L (ref 0–32)
AST: 20 IU/L (ref 0–40)
Albumin/Globulin Ratio: 1.6 (ref 1.2–2.2)
Albumin: 4.1 g/dL (ref 3.8–4.9)
Alkaline Phosphatase: 90 IU/L (ref 44–121)
BUN/Creatinine Ratio: 10 (ref 9–23)
BUN: 8 mg/dL (ref 6–24)
Bilirubin Total: 0.4 mg/dL (ref 0.0–1.2)
CO2: 25 mmol/L (ref 20–29)
Calcium: 9.6 mg/dL (ref 8.7–10.2)
Chloride: 104 mmol/L (ref 96–106)
Creatinine, Ser: 0.84 mg/dL (ref 0.57–1.00)
Globulin, Total: 2.6 g/dL (ref 1.5–4.5)
Glucose: 113 mg/dL — ABNORMAL HIGH (ref 70–99)
Potassium: 4.5 mmol/L (ref 3.5–5.2)
Sodium: 143 mmol/L (ref 134–144)
Total Protein: 6.7 g/dL (ref 6.0–8.5)
eGFR: 82 mL/min/{1.73_m2} (ref >59)

## 2022-01-08 LAB — LIPID PANEL
Chol/HDL Ratio: 3.1 ratio (ref 0.0–4.4)
Cholesterol, Total: 124 mg/dL (ref 100–199)
HDL: 40 mg/dL (ref >39)
LDL Chol Calc (NIH): 64 mg/dL (ref 0–99)
Triglycerides: 111 mg/dL (ref 0–149)
VLDL Cholesterol Cal: 20 mg/dL (ref 5–40)

## 2022-01-08 LAB — MICROALBUMIN / CREATININE URINE RATIO
Creatinine, Urine: 103 mg/dL
Microalb/Creat Ratio: 6 mg/g creat (ref 0–29)
Microalbumin, Urine: 6.3 ug/mL

## 2022-01-13 ENCOUNTER — Other Ambulatory Visit: Payer: Self-pay | Admitting: Family Medicine

## 2022-02-07 ENCOUNTER — Ambulatory Visit
Admission: RE | Admit: 2022-02-07 | Discharge: 2022-02-07 | Disposition: A | Discharge status: Home / Self Care | Payer: Managed Care, Other (non HMO) | Source: Ambulatory Visit | Attending: Family Medicine | Admitting: Family Medicine

## 2022-02-07 DIAGNOSIS — Z1231 Encounter for screening mammogram for malignant neoplasm of breast: Secondary | ICD-10-CM | POA: Insufficient documentation

## 2022-03-19 LAB — HM DIABETES EYE EXAM

## 2022-03-20 ENCOUNTER — Encounter: Payer: Self-pay | Admitting: Family Medicine

## 2022-04-13 ENCOUNTER — Other Ambulatory Visit: Payer: Self-pay | Admitting: Family Medicine

## 2022-04-16 ENCOUNTER — Encounter: Payer: Managed Care, Other (non HMO) | Admitting: Family Medicine

## 2022-04-17 ENCOUNTER — Ambulatory Visit (INDEPENDENT_AMBULATORY_CARE_PROVIDER_SITE_OTHER): Payer: Managed Care, Other (non HMO) | Admitting: Family Medicine

## 2022-04-17 ENCOUNTER — Encounter: Payer: Self-pay | Admitting: Family Medicine

## 2022-04-17 VITALS — BP 115/77 | HR 93 | Temp 98.4°F | Resp 16 | Ht 64.0 in | Wt 218.0 lb

## 2022-04-17 DIAGNOSIS — Z0000 Encounter for general adult medical examination without abnormal findings: Secondary | ICD-10-CM

## 2022-04-17 DIAGNOSIS — Z23 Encounter for immunization: Secondary | ICD-10-CM | POA: Diagnosis not present

## 2022-04-17 NOTE — Patient Instructions (Addendum)
Things to do to keep yourself healthy  - Exercise at least 30-45 minutes a day, 3-4 days a week.  - Eat a low-fat diet with lots of fruits and vegetables, up to 7-9 servings per day.  - Seatbelts can save your life. Wear them always.  - Smoke detectors on every level of your home, check batteries every year.  - Eye Doctor - have an eye exam every 1-2 years  - Safe sex - if you may be exposed to STDs, use a condom.  - Alcohol -  If you drink, do it moderately, less than 2 drinks per day.  - [place]. Choose someone to speak for you if you are not able. https://www.prepareforyourcare.org is a great website to help you navigate this. - Depression is common in our stressful world.If you're feeling down or losing interest in things you normally enjoy, please come in for a visit.  - Violence - If anyone is threatening or hurting you, please call immediately.   Diet Recommendations for Diabetes   1. Eat at least 3 meals and 1-2 snacks per day. Never go more than 4-5 hours while awake without eating. Eat breakfast within the first hour of getting up.   2. Limit starchy foods to TWO per meal and ONE per snack. ONE portion of a starchy  food is equal to the following:   - ONE slice of bread (or its equivalent, such as half of a hamburger bun).   - 1/2 cup of a "scoopable" starchy food such as potatoes or rice.   - 15 grams of Total Carbohydrate as shown on food label.  3. Include at every meal: a protein food, a carb food, and vegetables and/or fruit.   - Obtain twice the volume of vegetables as protein or carbohydrate foods for both lunch and dinner.   - Fresh or frozen vegetables are best.   - Keep frozen vegetables on hand for a quick vegetable serving.       Starchy (carb) foods: Bread, rice, pasta, potatoes, corn, cereal, grits, crackers, bagels, muffins, all baked goods.  (Fruits, milk, and yogurt also have carbohydrate, but most of these foods will not spike your blood  sugar as most starchy foods will.)  A few fruits do cause high blood sugars; use small portions of bananas (limit to 1/2 at a time), grapes, watermelon, oranges, and most tropical fruits.    Protein foods: Meat, fish, poultry, eggs, dairy foods, and beans such as pinto and kidney beans (beans also provide carbohydrate).      Look for opportunities to move your body throughout your day:  Never lie down when you can sit; never sit when you can stand; never stand when you can pace.  Moving your body throughout the day is just as important as the 30 or 60 minutes of exercise at the gym!  Get social Get active with your friends instead of going out to eat. Go for a hike, walk around the mall, or play an exercise-themed video game.   Move more at work Fit more activity into the workday. Stand during phone calls, use a printer farther from your desk, and get up to stretch each hour.    Do something new Develop a new skill to kick-start your motivation. Sign up for a class to learn how to [employer], surf, do tai chi, or play a sport.    Keep cool in the pool Don't like to sweat? Hit the local community  pool for a swim, water polo, or water aerobics class to stay cool while exercising.    Stay on track Use a fitness tracker (FITBIT, Fitness Pal mobile app) to track your activity and provide motivation to reach your goals.

## 2022-04-17 NOTE — Progress Notes (Signed)
BP 115/77 (BP Location: Left Arm, Patient Position: Sitting, Cuff Size: Large)   Pulse 93   Temp 98.4 F (36.9 C) (Oral)   Resp 16   Ht 5\' 4"  (1.626 m)   Wt 218 lb (98.9 kg)   BMI 37.42 kg/m    Subjective:    Patient ID: , female    DOB: 26-Jul-1966, 56 y.o.   MRN: [id number]  HPI: Nancy Blake is a 56 y.o. female presenting on 04/17/2022 for comprehensive medical examination. Current medical complaints include:none  She currently lives with: husband Menopausal Symptoms: occasional  Depression Screen done today and results listed below:     04/17/2022    9:04 AM 01/07/2022    8:15 AM 12/26/2020    1:26 PM 08/08/2020    9:09 AM 07/31/2020    2:03 PM  Depression screen PHQ 2/9  Decreased Interest 0 0 0 0 0  Down, Depressed, Hopeless 0 0 0 0 0  PHQ - 2 Score 0 0 0 0 0  Altered sleeping  1 0 0   Tired, decreased energy  1 0 0   Change in appetite  0 0 0   Feeling bad or failure about yourself   0 0 0   Trouble concentrating  0 0 0   Moving slowly or fidgety/restless  0 0 0   Suicidal thoughts  0 0 0   PHQ-9 Score  2 0 0   Difficult doing work/chores  Not difficult at all Not difficult at all Not difficult at all     The patient has had 1 fall in the last year.I did not complete a risk assessment for falls. A plan of care for falls was not documented.   Past Medical History:  Past Medical History:  Diagnosis Date   Allergic rhinitis    Asthma    CPAP (continuous positive airway pressure) dependence    Diabetes mellitus without complication (HCC)    Pneumonia    Sleep apnea     Surgical History:  Past Surgical History:  Procedure Laterality Date   ABDOMINAL HYSTERECTOMY     APPENDECTOMY     COLONOSCOPY WITH PROPOFOL N/A 03/19/2016   Procedure: COLONOSCOPY WITH PROPOFOL;  Surgeon: 03/21/2016, MD;  Location: [facility] ENDOSCOPY;  Service: Endoscopy;  Laterality: N/A;   TUBAL LIGATION  1995    Medications:  Current Outpatient Medications on  File Prior to Visit  Medication Sig   acetaminophen (TYLENOL) 500 MG tablet Take 500 mg by mouth every 6 (six) hours as needed.   albuterol (VENTOLIN HFA) 108 (90 Base) MCG/ACT inhaler Inhale 2 puffs into the lungs every 6 (six) hours as needed for wheezing or shortness of breath.   atorvastatin (LIPITOR) 10 MG tablet TAKE 1 TABLET(10 MG) BY MOUTH DAILY   cetirizine (ZYRTEC) 10 MG tablet TAKE 1 TABLET BY MOUTH EVERY DAY   cholecalciferol (VITAMIN D3) 25 MCG (1000 UNIT) tablet Take 1,000 Units by mouth daily.   Continuous Blood Gluc Receiver (FREESTYLE LIBRE 2 READER) DEVI Use to monitor glucose continuously   Continuous Blood Gluc Sensor (FREESTYLE LIBRE 2 SENSOR) MISC Use to monitor glucose continuously. Change sensor every 14 days   EPINEPHrine 0.3 mg/0.3 mL IJ SOAJ injection Inject into the muscle.   ibuprofen (ADVIL,MOTRIN) 200 MG tablet Take 600 mg by mouth every 6 (six) hours as needed for mild pain or moderate pain.   ipratropium-albuterol (DUONEB) 0.5-2.5 (3) MG/3ML SOLN Take 3 mLs by nebulization every 4 (four)  hours as needed.   JARDIANCE 10 MG TABS tablet TAKE 1 TABLET(10 MG) BY MOUTH DAILY BEFORE BREAKFAST   metFORMIN (GLUCOPHAGE-XR) 500 MG 24 hr tablet TAKE 2 TABLETS BY MOUTH DAILY WITH BREAKFAST AND 1 TABLET EVERY EVENING   Multiple Vitamin (MULTIVITAMIN) tablet Take 1 tablet by mouth daily.   omeprazole (PRILOSEC) 20 MG capsule Take 20 mg by mouth daily.   ONETOUCH VERIO test strip TEST BLOOD SUGAR ONCE DAILY   clotrimazole-betamethasone (LOTRISONE) cream Apply 1 application topically 2 (two) times daily.   No current facility-administered medications on file prior to visit.    Allergies:  Allergies  Allergen Reactions   Peanut (Diagnostic) Anaphylaxis   Apple Hives    Social History:  Social History   Socioeconomic History   Marital status: Married    Spouse name: Not on file   Number of children: Not on file   Years of education: Not on file   Highest education  level: Not on file  Occupational History   Not on file  Tobacco Use   Smoking status: Former    Types: Cigarettes    Quit date: 10/21/1983    Years since quitting: 38.5   Smokeless tobacco: Never  Vaping Use   Vaping Use: Never used  Substance and Sexual Activity   Alcohol use: No   Drug use: No   Sexual activity: Not on file  Other Topics Concern   Not on file  Social History Narrative   Not on file   Social Determinants of Health   Financial Resource Strain: Not on file  Food Insecurity: Not on file  Transportation Needs: Not on file  Physical Activity: Not on file  Stress: Not on file  Social Connections: Not on file  Intimate Partner Violence: Not on file   Social History   Tobacco Use  Smoking Status Former   Types: Cigarettes   Quit date: 10/21/1983   Years since quitting: 38.5  Smokeless Tobacco Never   Social History   Substance and Sexual Activity  Alcohol Use No    Family History:  Family History  Problem Relation Age of Onset   CAD Mother    Dementia Mother        Early onset at 2   Healthy Father    Healthy Brother    Osteoporosis Maternal Grandmother    COPD Maternal Grandfather    Lung cancer Paternal Grandfather    Breast cancer Neg Hx     Past medical history, surgical history, medications, allergies, family history and social history reviewed with patient today and changes made to appropriate areas of the chart.      Objective:    BP 115/77 (BP Location: Left Arm, Patient Position: Sitting, Cuff Size: Large)   Pulse 93   Temp 98.4 F (36.9 C) (Oral)   Resp 16   Ht 5\' 4"  (1.626 m)   Wt 218 lb (98.9 kg)   BMI 37.42 kg/m   Wt Readings from Last 3 Encounters:  04/17/22 218 lb (98.9 kg)  01/07/22 219 lb (99.3 kg)  07/26/21 215 lb (97.5 kg)    Physical Exam Vitals reviewed.  Constitutional:      General: She is not in acute distress. HENT:     Head: Normocephalic and atraumatic.     Right Ear: Tympanic membrane, ear canal  and external ear normal.     Left Ear: Tympanic membrane, ear canal and external ear normal.     Nose: Nose normal.  Mouth/Throat:     Mouth: Mucous membranes are moist.     Pharynx: Oropharynx is clear.  Eyes:     Extraocular Movements: Extraocular movements intact.     Pupils: Pupils are equal, round, and reactive to light.  Cardiovascular:     Rate and Rhythm: Normal rate and regular rhythm.     Heart sounds: Normal heart sounds. No murmur heard. Pulmonary:     Effort: Pulmonary effort is normal. No respiratory distress.     Breath sounds: Normal breath sounds.  Abdominal:     General: Bowel sounds are normal.     Palpations: Abdomen is soft.     Tenderness: There is no abdominal tenderness.  Musculoskeletal:        General: Normal range of motion.     Cervical back: Normal range of motion.     Right lower leg: No edema.     Left lower leg: No edema.  Lymphadenopathy:     Cervical: No cervical adenopathy.  Skin:    General: Skin is warm and dry.  Neurological:     Mental Status: She is alert and oriented to person, place, and time. Mental status is at baseline.     Gait: Gait normal.  Psychiatric:        Mood and Affect: Mood normal.        Behavior: Behavior normal.     Results for orders placed or performed in visit on 03/19/22  HM DIABETES EYE EXAM  Result Value Ref Range   HM Diabetic Eye Exam No Retinopathy No Retinopathy      Assessment & Plan:   Problem List Items Addressed This Visit   None Visit Diagnoses     Need for shingles vaccine    -  Primary   Relevant Orders   Varicella-zoster vaccine IM (Completed)   Encounter for annual physical exam            Follow up plan: Return in about 3 months (around 07/18/2022) for diabetes, HLD.   LABORATORY TESTING:  - Pap smear: up to date  IMMUNIZATIONS:   - Tdap: Tetanus vaccination status reviewed: last tetanus booster within 10 years. - Influenza: Postponed to flu season - Pneumococcal: due  for PCV20, elects to wait. - HPV: Not applicable - Shingrix vaccine:  due for 2nd dose, received today. - COVID vaccine: refused  SCREENING: - Mammogram: Up to date  - Colonoscopy: Up to date  - Bone Density:  wants to wait until next mammogram   - Lung Cancer Screening: Ordered today   Hep C Screening: UTD STD testing and prevention (HIV/chl/gon/syphilis): no concerns Sexual History : Menstrual History/LMP/Abnormal Bleeding: surgical menopause Incontinence Symptoms: none  Osteoporosis: Discussed high calcium and vitamin D supplementation, weight bearing exercises  Advanced Care Planning: A voluntary discussion about advance care planning including the explanation and discussion of advance directives.  Discussed health care proxy and Living will, and the patient was able to identify a health care proxy as husband Vila Dory.  Patient does not have a living will at present time. If patient does have living will, I have requested they bring this to the clinic to be scanned in to their chart.  PATIENT COUNSELING:   Advised to take 1 mg of folate supplement per day if capable of pregnancy.   Sexuality: Discussed sexually transmitted diseases, partner selection, use of condoms, avoidance of unintended pregnancy  and contraceptive alternatives.   Advised to avoid cigarette smoking.  I discussed with the  patient that most people either abstain from alcohol or drink within safe limits (<=14/week and <=4 drinks/occasion for males, <=7/weeks and <= 3 drinks/occasion for females) and that the risk for alcohol disorders and other health effects rises proportionally with the number of drinks per week and how often a drinker exceeds daily limits.  Discussed cessation/primary prevention of drug use and availability of treatment for abuse.   Diet: Encouraged to adjust caloric intake to maintain  or achieve ideal body weight, to reduce intake of dietary saturated fat and total fat, to limit sodium  intake by avoiding high sodium foods and not adding table salt, and to maintain adequate dietary potassium and calcium preferably from fresh fruits, vegetables, and low-fat dairy products.    Stressed the importance of regular exercise.  Injury prevention: Discussed safety belts, safety helmets, smoke detector, smoking near bedding or upholstery.   Dental health: Discussed importance of regular tooth brushing, flossing, and dental visits.    NEXT PREVENTATIVE PHYSICAL DUE IN 1 YEAR. Return in about 3 months (around 07/18/2022) for diabetes, HLD.

## 2022-06-26 ENCOUNTER — Ambulatory Visit: Payer: Managed Care, Other (non HMO) | Admitting: Internal Medicine

## 2022-06-26 ENCOUNTER — Encounter: Payer: Self-pay | Admitting: Internal Medicine

## 2022-06-26 VITALS — BP 124/78 | HR 72 | Temp 97.9°F | Ht 64.0 in | Wt 219.0 lb

## 2022-06-26 DIAGNOSIS — G4733 Obstructive sleep apnea (adult) (pediatric): Secondary | ICD-10-CM | POA: Diagnosis not present

## 2022-06-26 DIAGNOSIS — J4520 Mild intermittent asthma, uncomplicated: Secondary | ICD-10-CM | POA: Diagnosis not present

## 2022-06-26 MED ORDER — ALBUTEROL SULFATE HFA 108 (90 BASE) MCG/ACT IN AERS
2.0000 | INHALATION_SPRAY | Freq: Four times a day (QID) | RESPIRATORY_TRACT | 2 refills | Status: DC | PRN
Start: 2022-06-26 — End: 2023-09-04

## 2022-06-26 MED ORDER — IPRATROPIUM-ALBUTEROL 0.5-2.5 (3) MG/3ML IN SOLN: 3.0000 mL | RESPIRATORY_TRACT | 1 refills | Status: DC | PRN

## 2022-06-26 NOTE — Progress Notes (Signed)
Nancy Blake      Date: 06/26/2022,   MRN# [id number] Nancy Blake Nancy Blake 12/30/1965    AdmissionWeight: 219 lb (99.3 kg)                 CurrentWeight: 219 lb (99.3 kg) Nancy Blake is a 56 y.o. old female seen in for acute Blake visit of cough, nasal congestion, sob, and chest tightness   PFT  01/03/2016 reviewed with patient Ratio 88% FEV1 98% FVC 87% DLCO 95%  WNL  COMPLIANCE DL 11/4266 Excellent report 100% days 97%>4 hrs AHI 0.3 autoCPAP 5-20  CHIEF COMPLAINT:   Follow-up asthma Follow-up OSA Follow-up asthma   HISTORY OF PRESENT ILLNESS   DX of ASTHMA Asthma seems to be under control at this time   No exacerbation at this time No evidence of heart failure at this time No evidence or signs of infection at this time No respiratory distress No fevers, chills, nausea, vomiting, diarrhea No evidence of lower extremity edema No evidence hemoptysis   Diagnosis of OSA AHI was 28 Hypoxia to 77%   Compliance report reviewed with patient Excellent compliance report 100% for days pleasant 100% for greater than 4 hours AHI  reduce to 0.8    Current Medication:  Current Outpatient Medications:    acetaminophen (TYLENOL) 500 MG tablet, Take 500 mg by mouth every 6 (six) hours as needed., Disp: , Rfl:    albuterol (VENTOLIN HFA) 108 (90 Base) MCG/ACT inhaler, Inhale 2 puffs into the lungs every 6 (six) hours as needed for wheezing or shortness of breath., Disp: 18 g, Rfl: 0   atorvastatin (LIPITOR) 10 MG tablet, TAKE 1 TABLET(10 MG) BY MOUTH DAILY, Disp: 90 tablet, Rfl: 2   cetirizine (ZYRTEC) 10 MG tablet, TAKE 1 TABLET BY MOUTH EVERY DAY, Disp: 30 tablet, Rfl: 11   cholecalciferol (VITAMIN D3) 25 MCG (1000 UNIT) tablet, Take 1,000 Units by mouth daily., Disp: , Rfl:    Continuous Blood Gluc Receiver (FREESTYLE LIBRE 2 READER) DEVI, Use to monitor glucose continuously, Disp: 1 each, Rfl: 5   Continuous Blood Gluc Sensor  (FREESTYLE LIBRE 2 SENSOR) MISC, Use to monitor glucose continuously. Change sensor every 14 days, Disp: 6 each, Rfl: 3   EPINEPHrine 0.3 mg/0.3 mL IJ SOAJ injection, Inject into the muscle., Disp: , Rfl:    ibuprofen (ADVIL,MOTRIN) 200 MG tablet, Take 600 mg by mouth every 6 (six) hours as needed for mild pain or moderate pain., Disp: , Rfl:    ipratropium-albuterol (DUONEB) 0.5-2.5 (3) MG/3ML SOLN, Take 3 mLs by nebulization every 4 (four) hours as needed., Disp: 360 mL, Rfl: 1   JARDIANCE 10 MG TABS tablet, TAKE 1 TABLET(10 MG) BY MOUTH DAILY BEFORE BREAKFAST, Disp: 90 tablet, Rfl: 1   metFORMIN (GLUCOPHAGE-XR) 500 MG 24 hr tablet, TAKE 2 TABLETS BY MOUTH DAILY WITH BREAKFAST AND 1 TABLET EVERY EVENING, Disp: 270 tablet, Rfl: 0   Multiple Vitamin (MULTIVITAMIN) tablet, Take 1 tablet by mouth daily., Disp: , Rfl:    omeprazole (PRILOSEC) 20 MG capsule, Take 20 mg by mouth daily., Disp: , Rfl:    ONETOUCH VERIO test strip, TEST BLOOD SUGAR ONCE DAILY, Disp: 100 strip, Rfl: 3    ALLERGIES   Peanut (diagnostic) and Apple     Review of Systems: Gen:  Denies  fever, sweats, chills weight loss  HEENT: Denies blurred vision, double vision, ear pain, eye pain, hearing loss, nose bleeds, sore throat Cardiac:  No dizziness, chest pain  or heaviness, chest tightness,edema, No JVD Resp:   No cough, -sputum production, -shortness of breath,-wheezing, -hemoptysis,  Other:  All other systems negative   BP 124/78 (BP Location: Left Arm, Cuff Size: Normal)   Pulse 72   Temp 97.9 F (36.6 C) (Temporal)   Ht 5\' 4"  (1.626 m)   Wt 219 lb (99.3 kg)   SpO2 98%   BMI 37.59 kg/m   Physical Examination:   General Appearance: No distress  EYES PERRLA, EOM intact.   NECK Supple, No JVD Pulmonary: normal breath sounds, No wheezing.  CardiovascularNormal S1,S2.  No m/r/g.   Abdomen: Benign, Soft, non-tender. ALL OTHER ROS ARE NEGATIVE      ASSESSMENT/PLAN     Asthma mild intermittent  well-controlled  Avoidance of allergens Not on maintenance therapy +peanut and food allergy No evidence of exacerbation at this time Asthma seems to be well-controlled Albuterol as needed   Obesity -recommend significant weight loss -recommend changing diet  Deconditioned state -Recommend increased daily activity and exercise  OSA Patient with excellent compliance report 100% for days Greater than 100% for more than 4 hours/day AHI reduced to 0.8 Patient with excellent compliance report Patient uses benefits from therapy   MEDICATION ADJUSTMENTS/LABS AND TESTS ORDERED: Continue inhalers as prescribed Continue auto CPAP as prescribed Avoid Allergens   CURRENT MEDICATIONS REVIEWED AT LENGTH WITH PATIENT TODAY   Patient satisfied with Plan of action and management. All questions answered  Follow up in 1 year  TOTAL TIME SPENT 24 mins   Nancy Blake , M.D.  [name]  Medical Director [facility] Medical Director [facility] Cardio-Pulmonary Department

## 2022-06-26 NOTE — Patient Instructions (Addendum)
Continue ALBUTEROL AS NEEDED Continue auto CPAP as prescribed  AVOID ALLERGENS

## 2022-06-26 NOTE — Addendum Note (Signed)
Addended by: Lajoyce Lauber A on: 06/26/2022 12:17 PM   Modules accepted: Orders

## 2022-07-12 ENCOUNTER — Other Ambulatory Visit: Payer: Self-pay | Admitting: Family Medicine

## 2022-07-22 ENCOUNTER — Telehealth: Payer: Self-pay | Admitting: Family Medicine

## 2022-07-22 NOTE — Telephone Encounter (Addendum)
Pt called, left detailed VM that medication was sent to pharmacy today for metformin and if pt had any additional questions to CB.   metFORMIN (GLUCOPHAGE-XR) 500 MG 24 hr tablet 270 tablet 0 07/22/2022    Sig: Take 2 tablets daily with breakfast and 1 tablet every evening. Please schedule office visit before any future refill.   Sent to pharmacy as: metFORMIN (GLUCOPHAGE-XR) 500 MG 24 hr tablet   E-Prescribing Status: Receipt confirmed by pharmacy (07/22/2022  4:47 PM EDT)

## 2022-07-22 NOTE — Telephone Encounter (Signed)
Pt is calling to check on the status of medication refill. Pharmacy stated that the medication was denied. CB- [phone]

## 2022-11-07 ENCOUNTER — Ambulatory Visit: Payer: Managed Care, Other (non HMO) | Admitting: Family Medicine

## 2022-11-07 ENCOUNTER — Encounter: Payer: Self-pay | Admitting: Family Medicine

## 2022-11-07 VITALS — BP 108/70 | HR 79 | Temp 97.8°F | Resp 16 | Wt 226.0 lb

## 2022-11-07 DIAGNOSIS — E1169 Type 2 diabetes mellitus with other specified complication: Secondary | ICD-10-CM | POA: Diagnosis not present

## 2022-11-07 DIAGNOSIS — E785 Hyperlipidemia, unspecified: Secondary | ICD-10-CM

## 2022-11-07 LAB — POCT GLYCOSYLATED HEMOGLOBIN (HGB A1C)
Est. average glucose Bld gHb Est-mCnc: 148
Hemoglobin A1C: 6.8 % — AB (ref 4.0–5.6)

## 2022-11-07 MED ORDER — METFORMIN HCL ER 500 MG PO TB24: ORAL_TABLET | ORAL | 0 refills | Status: DC

## 2022-11-07 NOTE — Progress Notes (Signed)
I,Sulibeya S Dimas,acting as a scribe for [name], MD.,have documented all relevant documentation on the behalf of [name], MD,as directed by  [name], MD while in the presence of [name], MD.     Established patient visit   Patient: Nancy Blake   DOB: [birth year]   57 y.o. Female  MRN: [id number] Visit Date: 11/07/2022  Today's healthcare provider: [name], MD   Chief Complaint  Patient presents with   Diabetes   Subjective    HPI  Diabetes Mellitus Type II, Follow-up  Lab Results  Component Value Date   HGBA1C 6.8 (A) 11/07/2022   HGBA1C 6.3 (A) 01/07/2022   HGBA1C 6.6 (A) 04/09/2021   Wt Readings from Last 3 Encounters:  11/07/22 226 lb (102.5 kg)  06/26/22 219 lb (99.3 kg)  04/17/22 218 lb (98.9 kg)   Last seen for diabetes 6 months ago.  Management since then includes no changes. She reports excellent compliance with treatment. She is not having side effects.   Home blood sugar records: fasting range: 110-120s  Episodes of hypoglycemia? No    Current insulin regiment: none Most Recent Eye Exam: UTD  Pertinent Labs: Lab Results  Component Value Date   CHOL 124 01/07/2022   HDL 40 01/07/2022   LDLCALC 64 01/07/2022   TRIG 111 01/07/2022   CHOLHDL 3.1 01/07/2022   Lab Results  Component Value Date   NA 143 01/07/2022   K 4.5 01/07/2022   CREATININE 0.84 01/07/2022   EGFR 82 01/07/2022   LABMICR 6.3 01/07/2022   MICRALBCREAT 6 01/07/2022     ---------------------------------------------------------------------------------------------------   Medications: Outpatient Medications Prior to Visit  Medication Sig   acetaminophen (TYLENOL) 500 MG tablet Take 500 mg by mouth every 6 (six) hours as needed.   albuterol (VENTOLIN HFA) 108 (90 Base) MCG/ACT inhaler Inhale 2 puffs into the lungs every 6 (six) hours as needed for wheezing or shortness of breath.   atorvastatin (LIPITOR) 10 MG tablet  TAKE 1 TABLET(10 MG) BY MOUTH DAILY   cetirizine (ZYRTEC) 10 MG tablet TAKE 1 TABLET BY MOUTH EVERY DAY   cholecalciferol (VITAMIN D3) 25 MCG (1000 UNIT) tablet Take 1,000 Units by mouth daily.   EPINEPHrine 0.3 mg/0.3 mL IJ SOAJ injection Inject into the muscle.   ibuprofen (ADVIL,MOTRIN) 200 MG tablet Take 600 mg by mouth every 6 (six) hours as needed for mild pain or moderate pain.   ipratropium-albuterol (DUONEB) 0.5-2.5 (3) MG/3ML SOLN Take 3 mLs by nebulization every 4 (four) hours as needed.   JARDIANCE 10 MG TABS tablet TAKE 1 TABLET(10 MG) BY MOUTH DAILY BEFORE BREAKFAST   Multiple Vitamin (MULTIVITAMIN) tablet Take 1 tablet by mouth daily.   omeprazole (PRILOSEC) 20 MG capsule Take 20 mg by mouth daily.   ONETOUCH VERIO test strip TEST BLOOD SUGAR ONCE DAILY   [DISCONTINUED] metFORMIN (GLUCOPHAGE-XR) 500 MG 24 hr tablet Take 2 tablets daily with breakfast and 1 tablet every evening. Please schedule office visit before any future refill.   [DISCONTINUED] Continuous Blood Gluc Receiver (FREESTYLE LIBRE 2 READER) DEVI Use to monitor glucose continuously (Patient not taking: Reported on 11/07/2022)   [DISCONTINUED] Continuous Blood Gluc Sensor (FREESTYLE LIBRE 2 SENSOR) MISC Use to monitor glucose continuously. Change sensor every 14 days (Patient not taking: Reported on 11/07/2022)   No facility-administered medications prior to visit.    Review of Systems per HPI     Objective    BP 108/70 (BP Location: Left Arm, Patient Position: Sitting,  Cuff Size: Large)   Pulse 79   Temp 97.8 F (36.6 C) (Temporal)   Resp 16   Wt 226 lb (102.5 kg)   BMI 38.79 kg/m    Physical Exam Vitals reviewed.  Constitutional:      General: She is not in acute distress.    Appearance: Normal appearance. She is well-developed. She is not diaphoretic.  HENT:     Head: Normocephalic and atraumatic.  Eyes:     General: No scleral icterus.    Conjunctiva/sclera: Conjunctivae normal.  Neck:      Thyroid: No thyromegaly.  Cardiovascular:     Rate and Rhythm: Normal rate and regular rhythm.     Heart sounds: Normal heart sounds. No murmur heard. Pulmonary:     Effort: Pulmonary effort is normal. No respiratory distress.     Breath sounds: Normal breath sounds. No wheezing, rhonchi or rales.  Musculoskeletal:     Cervical back: Neck supple.     Right lower leg: No edema.     Left lower leg: No edema.  Lymphadenopathy:     Cervical: No cervical adenopathy.  Skin:    General: Skin is warm and dry.     Findings: No rash.  Neurological:     Mental Status: She is alert and oriented to person, place, and time. Mental status is at baseline.  Psychiatric:        Mood and Affect: Mood normal.        Behavior: Behavior normal.       Results for orders placed or performed in visit on 11/07/22  POCT glycosylated hemoglobin (Hb A1C)  Result Value Ref Range   Hemoglobin A1C 6.8 (A) 4.0 - 5.6 %   Est. average glucose Bld gHb Est-mCnc 148     Assessment & Plan     Problem List Items Addressed This Visit       Endocrine   Diabetes ([place]) - Primary    Well controlled Assoc with HLD, Obesity, OSA Continue current medications UTD on vaccines, eye exam, foot exam Recheck UACR On Statin Discussed diet and exercise F/u in 6 months       Relevant Medications   metFORMIN (GLUCOPHAGE-XR) 500 MG 24 hr tablet   Other Relevant Orders   POCT glycosylated hemoglobin (Hb A1C) (Completed)   Comprehensive metabolic panel   Urine Microalbumin w/creat. ratio   Hyperlipidemia associated with type 2 diabetes mellitus (HCC)    Previously well controlled Continue statin Repeat FLP and CMP Goal LDL < 70      Relevant Medications   metFORMIN (GLUCOPHAGE-XR) 500 MG 24 hr tablet   Other Relevant Orders   Comprehensive metabolic panel   Lipid panel     Other   Morbid obesity (HCC)    BMI 38 and assoc with DM, OSA, HLD Discussed importance of healthy weight management Discussed diet  and exercise       Relevant Medications   metFORMIN (GLUCOPHAGE-XR) 500 MG 24 hr tablet     Return in about 6 months (around 05/08/2023) for CPE.      I, [name], MD, have reviewed all documentation for this visit. The documentation on 11/07/22 for the exam, diagnosis, procedures, and orders are all accurate and complete.   Damontre Millea, Dionne Bucy, MD, MPH [facility]

## 2022-11-07 NOTE — Assessment & Plan Note (Signed)
BMI 38 and assoc with DM, OSA, HLD Discussed importance of healthy weight management Discussed diet and exercise

## 2022-11-07 NOTE — Assessment & Plan Note (Addendum)
Well controlled Assoc with HLD, Obesity, OSA Continue current medications UTD on vaccines, eye exam, foot exam Recheck UACR On Statin Discussed diet and exercise F/u in 6 months

## 2022-11-07 NOTE — Assessment & Plan Note (Signed)
Previously well controlled Continue statin Repeat FLP and CMP Goal LDL < 70

## 2022-11-08 LAB — LIPID PANEL
Chol/HDL Ratio: 3.3 ratio (ref 0.0–4.4)
Cholesterol, Total: 174 mg/dL (ref 100–199)
HDL: 52 mg/dL (ref >39)
LDL Chol Calc (NIH): 97 mg/dL (ref 0–99)
Triglycerides: 144 mg/dL (ref 0–149)
VLDL Cholesterol Cal: 25 mg/dL (ref 5–40)

## 2022-11-08 LAB — COMPREHENSIVE METABOLIC PANEL WITH GFR
ALT: 19 IU/L (ref 0–32)
AST: 15 IU/L (ref 0–40)
Albumin/Globulin Ratio: 1.7 (ref 1.2–2.2)
Albumin: 4.1 g/dL (ref 3.8–4.9)
Alkaline Phosphatase: 86 IU/L (ref 44–121)
BUN/Creatinine Ratio: 15 (ref 9–23)
BUN: 13 mg/dL (ref 6–24)
Bilirubin Total: <0.2 mg/dL (ref 0.0–1.2)
CO2: 23 mmol/L (ref 20–29)
Calcium: 9.7 mg/dL (ref 8.7–10.2)
Chloride: 102 mmol/L (ref 96–106)
Creatinine, Ser: 0.86 mg/dL (ref 0.57–1.00)
Globulin, Total: 2.4 g/dL (ref 1.5–4.5)
Glucose: 91 mg/dL (ref 70–99)
Potassium: 5 mmol/L (ref 3.5–5.2)
Sodium: 143 mmol/L (ref 134–144)
Total Protein: 6.5 g/dL (ref 6.0–8.5)
eGFR: 79 mL/min/1.73

## 2022-11-08 LAB — MICROALBUMIN / CREATININE URINE RATIO
Creatinine, Urine: 66 mg/dL
Microalb/Creat Ratio: <5 mg/g creat (ref 0–29)
Microalbumin, Urine: <3 ug/mL

## 2022-11-11 ENCOUNTER — Ambulatory Visit: Payer: Managed Care, Other (non HMO) | Admitting: Family Medicine

## 2023-01-10 ENCOUNTER — Other Ambulatory Visit: Payer: Self-pay | Admitting: Family Medicine

## 2023-01-10 ENCOUNTER — Telehealth: Payer: Self-pay | Admitting: Family Medicine

## 2023-01-10 DIAGNOSIS — E119 Type 2 diabetes mellitus without complications: Secondary | ICD-10-CM

## 2023-01-10 NOTE — Telephone Encounter (Signed)
Requested Prescriptions  Pending Prescriptions Disp Refills   ONETOUCH VERIO test strip [Pharmacy Med Name: ONE TOUCH VERIO TEST ST(NEW)100S] 100 strip 0    Sig: TEST BLOOD SUGAR ONCE DAILY     Endocrinology: Diabetes - Testing Supplies Passed - 01/10/2023  6:22 AM      Passed - Valid encounter within last 12 months    Recent Outpatient Visits           2 months ago Type 2 diabetes mellitus with other specified complication, without long-term current use of insulin ([place])   [place], Dionne Bucy, MD   8 months ago Need for shingles vaccine   [facility] Myles Gip, DO   1 year ago Type 2 diabetes mellitus with other specified complication, without long-term current use of insulin [facility])   [place] [facility] [place], Dionne Bucy, MD   1 year ago Type 2 diabetes mellitus with other specified complication, without long-term current use of insulin [facility])   [place] [facility] [place], Dionne Bucy, MD   2 years ago Encounter for annual physical exam   [facility] [place], Dionne Bucy, MD

## 2023-01-10 NOTE — Telephone Encounter (Unsigned)
Copied from [place] [id number]. Topic: General - Other >> Jan 10, 2023  3:11 PM Everette C wrote: Reason for CRM: Medication Refill - Medication: atorvastatin (LIPITOR) 10 MG tablet [id number]  Has the patient contacted their pharmacy? Yes.   (Agent: If no, request that the patient contact the pharmacy for the refill. If patient does not wish to contact the pharmacy document the reason why and proceed with request.) (Agent: If yes, when and what did the pharmacy advise?)  Preferred Pharmacy (with phone number or street name): [facility] [id number], [place] [address] Phone: [phone] Fax: [phone] Hours: Not open 24 hours   Has the patient been seen for an appointment in the last year OR does the patient have an upcoming appointment? Yes.    Agent: Please be advised that RX refills may take up to 3 business days. We ask that you follow-up with your pharmacy.

## 2023-01-10 NOTE — Telephone Encounter (Signed)
[facility] pharmacy faxed refill request for the following medications:   atorvastatin (LIPITOR) 10 MG tablet     Please advise  

## 2023-01-10 NOTE — Telephone Encounter (Signed)
Requested Prescriptions  Pending Prescriptions Disp Refills   JARDIANCE 10 MG TABS tablet [Pharmacy Med Name: JARDIANCE 10MG  TABLETS] 90 tablet 0    Sig: TAKE 1 TABLET(10 MG) BY MOUTH DAILY BEFORE BREAKFAST     Endocrinology:  Diabetes - SGLT2 Inhibitors Passed - 01/10/2023  6:22 AM      Passed - Cr in normal range and within 360 days    Creatinine, Ser  Date Value Ref Range Status  11/07/2022 0.86 0.57 - 1.00 mg/dL Final   Creatinine, POC  Date Value Ref Range Status  05/26/2019 NA mg/dL Final         Passed - HBA1C is between 0 and 7.9 and within 180 days    Hemoglobin A1C  Date Value Ref Range Status  11/07/2022 6.8 (A) 4.0 - 5.6 % Final   Hgb A1c MFr Bld  Date Value Ref Range Status  12/27/2020 7.2 (H) 4.8 - 5.6 % Final    Comment:             Prediabetes: 5.7 - 6.4          Diabetes: >6.4          Glycemic control for adults with diabetes: <7.0          Passed - eGFR in normal range and within 360 days    GFR calc Af Amer  Date Value Ref Range Status  04/14/2020 88 >59 mL/min/1.73 Final    Comment:    **[facility] currently reports eGFR in compliance with the current**   recommendations of the [employer]. [facility] will   update reporting as new guidelines are published from the NKF-ASN   Task force.    GFR, Estimated  Date Value Ref Range Status  07/26/2021 >60 >60 mL/min Final    Comment:    (NOTE) Calculated using the CKD-EPI Creatinine Equation (2021)    eGFR  Date Value Ref Range Status  11/07/2022 79 >59 mL/min/1.73 Final         Passed - Valid encounter within last 6 months    Recent Outpatient Visits           2 months ago Type 2 diabetes mellitus with other specified complication, without long-term current use of insulin ([place])   [place], Dionne Bucy, MD   8 months ago Need for shingles vaccine   [facility] Percy M, DO   1 year ago Type 2 diabetes  mellitus with other specified complication, without long-term current use of insulin [facility])   [place], Dionne Bucy, MD   1 year ago Type 2 diabetes mellitus with other specified complication, without long-term current use of insulin [facility])   [place] [facility] [place], Dionne Bucy, MD   2 years ago Encounter for annual physical exam   [facility] [place], Dionne Bucy, MD

## 2023-01-13 ENCOUNTER — Other Ambulatory Visit: Payer: Self-pay

## 2023-01-13 MED ORDER — ATORVASTATIN CALCIUM 10 MG PO TABS: ORAL_TABLET | ORAL | 2 refills | Status: DC

## 2023-01-13 NOTE — Telephone Encounter (Signed)
Requested Prescriptions  Pending Prescriptions Disp Refills   atorvastatin (LIPITOR) 10 MG tablet 90 tablet 2     Cardiovascular:  Antilipid - Statins Failed - 01/10/2023  3:20 PM      Failed - Lipid Panel in normal range within the last 12 months    Cholesterol, Total  Date Value Ref Range Status  11/07/2022 174 100 - 199 mg/dL Final   LDL Chol Calc (NIH)  Date Value Ref Range Status  11/07/2022 97 0 - 99 mg/dL Final   HDL  Date Value Ref Range Status  11/07/2022 52 >39 mg/dL Final   Triglycerides  Date Value Ref Range Status  11/07/2022 144 0 - 149 mg/dL Final         Passed - Patient is not pregnant      Passed - Valid encounter within last 12 months    Recent Outpatient Visits           2 months ago Type 2 diabetes mellitus with other specified complication, without long-term current use of insulin ([place])   [place], Dionne Bucy, MD   9 months ago Need for shingles vaccine   [facility] My[facility]ip, DO   1 year ago Type 2 diabetes mellitus with other specified complication, without long-term current use of insulin [facility])   [place], Dionne Bucy, MD   1 year ago Type 2 diabetes mellitus with other specified complication, without long-term current use of insulin [facility])   [place] [facility] [place], Dionne Bucy, MD   2 years ago Encounter for annual physical exam   [facility] [place], Dionne Bucy, MD

## 2023-02-17 ENCOUNTER — Other Ambulatory Visit: Payer: Self-pay | Admitting: Family Medicine

## 2023-02-17 NOTE — Telephone Encounter (Signed)
Medication Refill - Medication: metFORMIN (GLUCOPHAGE-XR) 500 MG 24 hr tablet [[id number]]   Has the patient contacted their pharmacy? Yes.     (Agent: If yes, when and what did the pharmacy advise?) No refills contact PCP   Preferred Pharmacy (with phone number or street name): [facility] #[id number] - [place], [place] - [address]   Has the patient been seen for an appointment in the last year OR does the patient have an upcoming appointment? Yes.    Agent: Please be advised that RX refills may take up to 3 business days. We ask that you follow-up with your pharmacy.   Pt says she has been out of medication since Friday.

## 2023-02-18 NOTE — Telephone Encounter (Signed)
Unable to refill per protocol, Rx request was refilled 02/17/23 for 90 days, duplicate request.  Requested Prescriptions  Pending Prescriptions Disp Refills   metFORMIN (GLUCOPHAGE-XR) 500 MG 24 hr tablet 270 tablet 0    Sig: TAKE 2 TABLETS BY MOUTH EVERY DAY WITH BREAKFAST AND 1 TABLET EVERY EVENING     Endocrinology:  Diabetes - Biguanides Failed - 02/17/2023  2:20 PM      Failed - B12 Level in normal range and within 720 days    Vitamin B-12  Date Value Ref Range Status  04/14/2020 484 232 - 1,245 pg/mL Final         Failed - CBC within normal limits and completed in the last 12 months    WBC  Date Value Ref Range Status  07/26/2021 10.0 4.0 - 10.5 K/uL Final   RBC  Date Value Ref Range Status  07/26/2021 5.41 (H) 3.87 - 5.11 MIL/uL Final   Hemoglobin  Date Value Ref Range Status  07/26/2021 14.6 12.0 - 15.0 g/dL Final  91/47/8295 62.1 11.1 - 15.9 g/dL Final   HCT  Date Value Ref Range Status  07/26/2021 44.1 36.0 - 46.0 % Final   Hematocrit  Date Value Ref Range Status  04/17/2018 41.3 34.0 - 46.6 % Final   MCHC  Date Value Ref Range Status  07/26/2021 33.1 30.0 - 36.0 g/dL Final   [facility]  Date Value Ref Range Status  07/26/2021 27.0 26.0 - 34.0 pg Final   MCV  Date Value Ref Range Status  07/26/2021 81.5 80.0 - 100.0 fL Final  04/17/2018 81 79 - 97 fL Final   No results found for: "PLTCOUNTKUC", "LABPLAT", "POCPLA" RDW  Date Value Ref Range Status  07/26/2021 14.0 11.5 - 15.5 % Final  04/17/2018 14.5 12.3 - 15.4 % Final         Passed - Cr in normal range and within 360 days    Creatinine, Ser  Date Value Ref Range Status  11/07/2022 0.86 0.57 - 1.00 mg/dL Final   Creatinine, POC  Date Value Ref Range Status  05/26/2019 NA mg/dL Final         Passed - HBA1C is between 0 and 7.9 and within 180 days    Hemoglobin A1C  Date Value Ref Range Status  11/07/2022 6.8 (A) 4.0 - 5.6 % Final   Hgb A1c MFr Bld  Date Value Ref Range Status  12/27/2020 7.2  (H) 4.8 - 5.6 % Final    Comment:             Prediabetes: 5.7 - 6.4          Diabetes: >6.4          Glycemic control for adults with diabetes: <7.0          Passed - eGFR in normal range and within 360 days    GFR calc Af Amer  Date Value Ref Range Status  04/14/2020 88 >59 mL/min/1.73 Final    Comment:    **[facility] currently reports eGFR in compliance with the current**   recommendations of the [employer]. [facility] will   update reporting as new guidelines are published from the NKF-ASN   Task force.    GFR, Estimated  Date Value Ref Range Status  07/26/2021 >60 >60 mL/min Final    Comment:    (NOTE) Calculated using the CKD-EPI Creatinine Equation (2021)    eGFR  Date Value Ref Range Status  11/07/2022 79 >59 mL/min/1.73 Final  Passed - Valid encounter within last 6 months    Recent Outpatient Visits           3 months ago Type 2 diabetes mellitus with other specified complication, without long-term current use of insulin [facility])   [place] [facility] [place], Marzella Schlein, MD   10 months ago Need for shingles vaccine   [facility] [place] M, DO   1 year ago Type 2 diabetes mellitus with other specified complication, without long-term current use of insulin [facility])   [facility] [place], Marzella Schlein, MD   1 year ago Type 2 diabetes mellitus with other specified complication, without long-term current use of insulin [facility])   [place] [facility] [place], Marzella Schlein, MD   2 years ago Encounter for annual physical exam   [facility] [place], Marzella Schlein, MD

## 2023-03-20 ENCOUNTER — Telehealth: Payer: Self-pay

## 2023-03-20 DIAGNOSIS — E785 Hyperlipidemia, unspecified: Secondary | ICD-10-CM

## 2023-03-20 DIAGNOSIS — E1169 Type 2 diabetes mellitus with other specified complication: Secondary | ICD-10-CM

## 2023-03-20 DIAGNOSIS — E559 Vitamin D deficiency, unspecified: Secondary | ICD-10-CM

## 2023-03-20 NOTE — Telephone Encounter (Signed)
Ok to order CMP, lipid, A1c, Vit D - use diagnoses HLD, diabetes and Vit D deficiency from her problem list. Recommend that she get them at least 2 days before her appt with me. Get them fasting. Thanks

## 2023-03-20 NOTE — Telephone Encounter (Signed)
Copied from CRM [id number]. Topic: General - Other >> Mar 20, 2023  4:08 PM Santiya F wrote: Reason for CRM: Pt has a physical scheduled for 05/06/23 and wants to know does she need to come in the day before to get her blood work done or is that something that will be done at the appointment.

## 2023-03-21 NOTE — Telephone Encounter (Signed)
Patient advised and lab order placed.

## 2023-03-21 NOTE — Addendum Note (Signed)
Addended by: Marjie Skiff on: 03/21/2023 11:15 AM   Modules accepted: Orders

## 2023-04-10 ENCOUNTER — Other Ambulatory Visit: Payer: Self-pay | Admitting: Family Medicine

## 2023-04-10 DIAGNOSIS — E119 Type 2 diabetes mellitus without complications: Secondary | ICD-10-CM

## 2023-05-02 ENCOUNTER — Other Ambulatory Visit: Payer: Self-pay

## 2023-05-02 DIAGNOSIS — E559 Vitamin D deficiency, unspecified: Secondary | ICD-10-CM

## 2023-05-02 DIAGNOSIS — E1169 Type 2 diabetes mellitus with other specified complication: Secondary | ICD-10-CM

## 2023-05-03 LAB — COMPREHENSIVE METABOLIC PANEL
ALT: 25 IU/L (ref 0–32)
AST: 17 IU/L (ref 0–40)
Albumin: 4.1 g/dL (ref 3.8–4.9)
Alkaline Phosphatase: 98 IU/L (ref 44–121)
BUN/Creatinine Ratio: 16 (ref 9–23)
BUN: 13 mg/dL (ref 6–24)
Bilirubin Total: 0.5 mg/dL (ref 0.0–1.2)
CO2: 21 mmol/L (ref 20–29)
Calcium: 9.3 mg/dL (ref 8.7–10.2)
Chloride: 104 mmol/L (ref 96–106)
Creatinine, Ser: 0.82 mg/dL (ref 0.57–1.00)
Globulin, Total: 2.3 g/dL (ref 1.5–4.5)
Glucose: 135 mg/dL — ABNORMAL HIGH (ref 70–99)
Potassium: 4.3 mmol/L (ref 3.5–5.2)
Sodium: 141 mmol/L (ref 134–144)
Total Protein: 6.4 g/dL (ref 6.0–8.5)
eGFR: 83 mL/min/{1.73_m2} (ref >59)

## 2023-05-03 LAB — LIPID PANEL
Chol/HDL Ratio: 2.5 ratio (ref 0.0–4.4)
Cholesterol, Total: 126 mg/dL (ref 100–199)
HDL: 50 mg/dL (ref >39)
LDL Chol Calc (NIH): 57 mg/dL (ref 0–99)
Triglycerides: 105 mg/dL (ref 0–149)
VLDL Cholesterol Cal: 19 mg/dL (ref 5–40)

## 2023-05-03 LAB — HEMOGLOBIN A1C
Est. average glucose Bld gHb Est-mCnc: 160 mg/dL
Hgb A1c MFr Bld: 7.2 % — ABNORMAL HIGH (ref 4.8–5.6)

## 2023-05-03 LAB — VITAMIN D 25 HYDROXY (VIT D DEFICIENCY, FRACTURES): Vit D, 25-Hydroxy: 54.4 ng/mL (ref 30.0–100.0)

## 2023-05-06 ENCOUNTER — Encounter: Payer: Self-pay | Admitting: Family Medicine

## 2023-05-06 ENCOUNTER — Ambulatory Visit (INDEPENDENT_AMBULATORY_CARE_PROVIDER_SITE_OTHER): Payer: Managed Care, Other (non HMO) | Admitting: Family Medicine

## 2023-05-06 VITALS — BP 132/78 | HR 88 | Temp 98.3°F | Resp 14 | Ht 64.0 in | Wt 228.9 lb

## 2023-05-06 DIAGNOSIS — Z0000 Encounter for general adult medical examination without abnormal findings: Secondary | ICD-10-CM

## 2023-05-06 DIAGNOSIS — E559 Vitamin D deficiency, unspecified: Secondary | ICD-10-CM | POA: Diagnosis not present

## 2023-05-06 DIAGNOSIS — E785 Hyperlipidemia, unspecified: Secondary | ICD-10-CM

## 2023-05-06 DIAGNOSIS — E1169 Type 2 diabetes mellitus with other specified complication: Secondary | ICD-10-CM | POA: Diagnosis not present

## 2023-05-06 MED ORDER — EMPAGLIFLOZIN 25 MG PO TABS: 25.0000 mg | ORAL_TABLET | Freq: Every day | ORAL | 1 refills | Status: DC

## 2023-05-06 NOTE — Assessment & Plan Note (Signed)
Elevated A1c (7.2) despite adherence to Jardiance 10mg  daily. Discussed discrepancy between home glucose monitoring and lab results. -Increase Jardiance to 25mg  daily. Patient to use remaining 10mg  tablets by taking two daily until supply is exhausted. - continue other medications at current doses -Check A1c in 3 months.

## 2023-05-06 NOTE — Assessment & Plan Note (Signed)
 Discussed importance of healthy weight management Discussed diet and exercise  

## 2023-05-06 NOTE — Progress Notes (Signed)
Complete physical exam  Patient: Nancy Blake   DOB: Jan 20, 1966   57 y.o. Female  MRN: [id number]  Subjective:    Chief Complaint  Patient presents with   Annual Exam    Needing wellness form for work fill out, she would also like to discuss about blood work that done recently     [employer] is a 57 y.o. female who presents today for a complete physical exam. She reports consuming a general diet.     She generally feels well. She reports sleeping well. She does not have additional problems to discuss today.   Discussed the use of AI scribe software for clinical note transcription with the patient, who gave verbal consent to proceed.  History of Present Illness   The patient, with a history of diabetes, presents for a physical examination. They express concern about their recent A1c levels, which were higher than expected based on their home glucose monitoring. They also noted a discrepancy between their home glucose monitor and the [facility] results, with the latter being higher. The patient lives approximately ten minutes away from the lab and checked their blood glucose levels shortly after the lab draw.  Additionally, the patient noticed a slightly low-normal CO2 level on their lab work. They were concerned about the implications of this result, particularly in relation to their asthma.  The patient also discussed their cholesterol levels, which have improved significantly due to medication adherence. They were fasting at the time of the lab draw. The patient's blood pressure was within normal limits during the visit.  The patient is due for an eye exam and mammogram, which they plan to schedule. They also mentioned that they are up to date on their shingles vaccination.        Most recent fall risk assessment:    11/07/2022    3:16 PM  Fall Risk   Falls in the past year? 0  Number falls in past yr: 0  Injury with Fall? 0  Risk for fall due to : No Fall Risks   Follow up Falls evaluation completed     Most recent depression screenings:    11/07/2022    3:16 PM 04/17/2022    9:04 AM  PHQ 2/9 Scores  PHQ - 2 Score 0 0  PHQ- 9 Score 1         Patient Care Team: Erasmo Downer, MD as PCP - General (Family Medicine)   Outpatient Medications Prior to Visit  Medication Sig   acetaminophen (TYLENOL) 500 MG tablet Take 500 mg by mouth every 6 (six) hours as needed.   albuterol (VENTOLIN HFA) 108 (90 Base) MCG/ACT inhaler Inhale 2 puffs into the lungs every 6 (six) hours as needed for wheezing or shortness of breath.   atorvastatin (LIPITOR) 10 MG tablet TAKE 1 TABLET(10 MG) BY MOUTH DAILY   cetirizine (ZYRTEC) 10 MG tablet TAKE 1 TABLET BY MOUTH EVERY DAY   cholecalciferol (VITAMIN D3) 25 MCG (1000 UNIT) tablet Take 1,000 Units by mouth daily.   EPINEPHrine 0.3 mg/0.3 mL IJ SOAJ injection Inject into the muscle.   ibuprofen (ADVIL,MOTRIN) 200 MG tablet Take 600 mg by mouth every 6 (six) hours as needed for mild pain or moderate pain.   ipratropium-albuterol (DUONEB) 0.5-2.5 (3) MG/3ML SOLN Take 3 mLs by nebulization every 4 (four) hours as needed.   metFORMIN (GLUCOPHAGE-XR) 500 MG 24 hr tablet TAKE 2 TABLETS BY MOUTH EVERY DAY WITH BREAKFAST AND 1 TABLET EVERY EVENING  Multiple Vitamin (MULTIVITAMIN) tablet Take 1 tablet by mouth daily.   omeprazole (PRILOSEC) 20 MG capsule Take 20 mg by mouth daily.   ONETOUCH VERIO test strip TEST BLOOD SUGAR EVERY DAY   [DISCONTINUED] JARDIANCE 10 MG TABS tablet TAKE 1 TABLET(10 MG) BY MOUTH DAILY BEFORE BREAKFAST   No facility-administered medications prior to visit.    ROS per HPI     Objective:     BP 132/78 (BP Location: Left Arm, Patient Position: Sitting, Cuff Size: Large)   Pulse 88   Temp 98.3 F (36.8 C) (Oral)   Resp 14   Ht 5\' 4"  (1.626 m)   Wt 228 lb 14.4 oz (103.8 kg)   SpO2 96%   BMI 39.29 kg/m    Physical Exam Vitals reviewed.  Constitutional:      General: She is  not in acute distress.    Appearance: Normal appearance. She is well-developed. She is not diaphoretic.  HENT:     Head: Normocephalic and atraumatic.     Right Ear: Tympanic membrane, ear canal and external ear normal.     Left Ear: Tympanic membrane, ear canal and external ear normal.     Nose: Nose normal.     Mouth/Throat:     Mouth: Mucous membranes are moist.     Pharynx: Oropharynx is clear. No oropharyngeal exudate.  Eyes:     General: No scleral icterus.    Conjunctiva/sclera: Conjunctivae normal.     Pupils: Pupils are equal, round, and reactive to light.  Neck:     Thyroid: No thyromegaly.  Cardiovascular:     Rate and Rhythm: Normal rate and regular rhythm.     Heart sounds: Normal heart sounds. No murmur heard. Pulmonary:     Effort: Pulmonary effort is normal. No respiratory distress.     Breath sounds: Normal breath sounds. No wheezing or rales.  Abdominal:     General: There is no distension.     Palpations: Abdomen is soft.     Tenderness: There is no abdominal tenderness.  Musculoskeletal:        General: No deformity.     Cervical back: Neck supple.     Right lower leg: No edema.     Left lower leg: No edema.  Lymphadenopathy:     Cervical: No cervical adenopathy.  Skin:    General: Skin is warm and dry.     Findings: No rash.  Neurological:     Mental Status: She is alert and oriented to person, place, and time. Mental status is at baseline.     Gait: Gait normal.  Psychiatric:        Mood and Affect: Mood normal.        Behavior: Behavior normal.        Thought Content: Thought content normal.      No results found for any visits on 05/06/23.     Assessment & Plan:    Routine Health Maintenance and Physical Exam  Immunization History  Administered Date(s) Administered   Influenza Split 08/22/2015, 09/10/2017   Influenza,inj,Quad PF,6+ Mos 09/10/2017, 08/19/2018, 09/02/2019, 08/08/2020   Pneumococcal Polysaccharide-23 12/08/2015   Tdap  04/13/2018   Zoster Recombinant(Shingrix) 12/26/2020, 04/17/2022    Health Maintenance  Topic Date Due   COVID-19 Vaccine (1) Never done   OPHTHALMOLOGY EXAM  03/20/2023   INFLUENZA VACCINE  05/22/2023   HEMOGLOBIN A1C  11/02/2023   Diabetic kidney evaluation - Urine ACR  11/08/2023   MAMMOGRAM  02/08/2024  Diabetic kidney evaluation - eGFR measurement  05/01/2024   FOOT EXAM  05/05/2024   PAP SMEAR-Modifier  12/26/2025   Colonoscopy  03/19/2026   DTaP/Tdap/Td (2 - Td or Tdap) 04/13/2028   Hepatitis C Screening  Completed   HIV Screening  Completed   Zoster Vaccines- Shingrix  Completed   HPV VACCINES  Aged Out    Discussed health benefits of physical activity, and encouraged her to engage in regular exercise appropriate for her age and condition.  Problem List Items Addressed This Visit       Endocrine   Diabetes (HCC)   Relevant Medications   empagliflozin (JARDIANCE) 25 MG TABS tablet   Hyperlipidemia associated with type 2 diabetes mellitus (HCC)   Relevant Medications   empagliflozin (JARDIANCE) 25 MG TABS tablet     Other   Morbid obesity (HCC)   Relevant Medications   empagliflozin (JARDIANCE) 25 MG TABS tablet   Vitamin D deficiency   Other Visit Diagnoses     Encounter for annual physical exam    -  Primary           General Health Maintenance: -Continue annual eye exams and mammograms. -Annual foot exam performed today with normal results. -Flu shot and COVID booster due in the fall. -Schedule 17-month follow-up appointment.       Return in about 3 months (around 08/06/2023) for chronic disease f/u.     Shirlee Latch, MD

## 2023-05-22 ENCOUNTER — Encounter: Payer: Self-pay | Admitting: Family Medicine

## 2023-05-26 MED ORDER — CLOTRIMAZOLE-BETAMETHASONE 1-0.05 % EX CREA: 1.0000 | TOPICAL_CREAM | Freq: Every day | CUTANEOUS | 0 refills | Status: AC

## 2023-05-30 ENCOUNTER — Telehealth (INDEPENDENT_AMBULATORY_CARE_PROVIDER_SITE_OTHER): Payer: Managed Care, Other (non HMO) | Admitting: Family Medicine

## 2023-05-30 ENCOUNTER — Encounter: Payer: Self-pay | Admitting: Family Medicine

## 2023-05-30 ENCOUNTER — Ambulatory Visit: Payer: Self-pay

## 2023-05-30 DIAGNOSIS — J4521 Mild intermittent asthma with (acute) exacerbation: Secondary | ICD-10-CM

## 2023-05-30 DIAGNOSIS — J40 Bronchitis, not specified as acute or chronic: Secondary | ICD-10-CM

## 2023-05-30 MED ORDER — PREDNISONE 10 MG PO TABS: ORAL_TABLET | ORAL | 0 refills | Status: AC

## 2023-05-30 MED ORDER — AZITHROMYCIN 250 MG PO TABS: ORAL_TABLET | ORAL | 0 refills | Status: AC

## 2023-05-30 NOTE — Progress Notes (Signed)
MyChart Video Visit    Virtual Visit via Video Note   This format is felt to be most appropriate for this patient at this time. Physical exam was limited by quality of the video and audio technology used for the visit.   Patient location: home Provider location: [facility]  I discussed the limitations of evaluation and management by telemedicine and the availability of in person appointments. The patient expressed understanding and agreed to proceed.  Patient: Nancy Blake   DOB: 02-03-1966   57 y.o. Female  MRN: [id number] Visit Date: 05/30/2023  Today's healthcare provider: Mila Merry, MD   Chief Complaint  Patient presents with   URI    Patient reports she did use albuterol inhaler and nebulizer today, she reports mild symptom improvement. She reports chest feels tighter. She denies any wheezing. She has not checked for COVID.    Subjective    Discussed the use of AI scribe software for clinical note transcription with the patient, who gave verbal consent to proceed.  History of Present Illness   The patient, with a history of asthma, presents with symptoms that began 5 days ago, initially manifesting as a sore throat. By the next day, the patient experienced a throbbing headache and general malaise. By the next morning, the patient reported chest tightness and difficulty taking deep breaths. The patient also reported nasal congestion and mild pain behind the eyes.  Under normal circumstances, the patient does not regularly use their albuterol inhaler or nebulizer, reserving them for occasional use as needed. However, with the onset of these symptoms, the patient has been coughing, albeit with minimal expectoration. The patient's oxygen saturation was measured at 95%.  Earlier in the week, the patient experienced a low-grade fever. The patient has previously been treated with Augmentin, doxycycline, and Z-Pak, and has responded well to these medications. The patient has also  found relief with prednisone during similar episodes.       Medications: Outpatient Medications Prior to Visit  Medication Sig   acetaminophen (TYLENOL) 500 MG tablet Take 500 mg by mouth every 6 (six) hours as needed.   albuterol (VENTOLIN HFA) 108 (90 Base) MCG/ACT inhaler Inhale 2 puffs into the lungs every 6 (six) hours as needed for wheezing or shortness of breath.   atorvastatin (LIPITOR) 10 MG tablet TAKE 1 TABLET(10 MG) BY MOUTH DAILY   cetirizine (ZYRTEC) 10 MG tablet TAKE 1 TABLET BY MOUTH EVERY DAY   cholecalciferol (VITAMIN D3) 25 MCG (1000 UNIT) tablet Take 1,000 Units by mouth daily.   clotrimazole-betamethasone (LOTRISONE) cream Apply 1 Application topically daily.   empagliflozin (JARDIANCE) 25 MG TABS tablet Take 1 tablet (25 mg total) by mouth daily.   EPINEPHrine 0.3 mg/0.3 mL IJ SOAJ injection Inject into the muscle.   ibuprofen (ADVIL,MOTRIN) 200 MG tablet Take 600 mg by mouth every 6 (six) hours as needed for mild pain or moderate pain.   ipratropium-albuterol (DUONEB) 0.5-2.5 (3) MG/3ML SOLN Take 3 mLs by nebulization every 4 (four) hours as needed.   metFORMIN (GLUCOPHAGE-XR) 500 MG 24 hr tablet TAKE 2 TABLETS BY MOUTH EVERY DAY WITH BREAKFAST AND 1 TABLET EVERY EVENING   Multiple Vitamin (MULTIVITAMIN) tablet Take 1 tablet by mouth daily.   omeprazole (PRILOSEC) 20 MG capsule Take 20 mg by mouth daily.   ONETOUCH VERIO test strip TEST BLOOD SUGAR EVERY DAY   No facility-administered medications prior to visit.      Objective    There were no vitals taken  for this visit.   Physical Exam   VITALS: SaO2- 95 by patient report Awake, alert, oriented x 3. In no apparent distress      Assessment & Plan     Assessment and Plan    Acute Bronchitis and Sinusitis Symptoms started on Monday with a sore throat, progressed to a throbbing headache, and now includes chest tightness and difficulty taking deep breaths. Patient has underlying asthma but does not use  maintenance inhaler or nebulizer regularly. Patient reports nasal congestion and pain behind eyes. Oxygen saturation at 95%. -Prescribe Z-Pak to cover potential bacterial infection. -Prescribe Prednisone, 2 tablets daily for 5 days.  Asthma Patient has underlying asthma, currently exacerbated by acute illness. Patient has Albuterol inhaler and nebulizer for use as needed. -Continue use of Albuterol inhaler and nebulizer as needed.       No follow-ups on file.     I discussed the assessment and treatment plan with the patient. The patient was provided an opportunity to ask questions and all were answered. The patient agreed with the plan and demonstrated an understanding of the instructions.   The patient was advised to call back or seek an in-person evaluation if the symptoms worsen or if the condition fails to improve as anticipated.  I provided 9 minutes of non-face-to-face time during this encounter.    Mila Merry, MD [facility] [phone] (phone) [phone] (fax)  [facility]

## 2023-05-30 NOTE — Telephone Encounter (Signed)
  Chief Complaint: cough Symptoms: chest feel tighter and weakness, fatigue and sore throat Frequency: breathing worse since last night Pertinent Negatives: Patient denies fever Disposition: [] ED /[] Urgent Care (no appt availability in office) / [x] Appointment(In office/virtual)/ []  [facility]/ [] Home Care/ [] Refused Recommended Disposition /[] [place]/ []  Follow-up with PCP Additional Notes: pt stated only could do VV-  Reason for Disposition  [1] MODERATE longstanding difficulty breathing (e.g., speaks in phrases, SOB even at rest, pulse 100-120) AND [2] SAME as normal  Answer Assessment - Initial Assessment Questions 1. RESPIRATORY STATUS: "Describe your breathing?" (e.g., wheezing, shortness of breath, unable to speak, severe coughing)      Sob chest feel tight hard to get a breathing 2. ONSET: "When did this breathing problem begin?"      Monday  3. PATTERN "Does the difficult breathing come and go, or has it been constant since it started?"      Last night SOB 4. SEVERITY: "How bad is your breathing?" (e.g., mild, moderate, severe)    - MILD: No SOB at rest, mild SOB with walking, speaks normally in sentences, can lie down, no retractions, pulse < 100.    - MODERATE: SOB at rest, SOB with minimal exertion and prefers to sit, cannot lie down flat, speaks in phrases, mild retractions, audible wheezing, pulse 100-120.    - SEVERE: Very SOB at rest, speaks in single words, struggling to breathe, sitting hunched forward, retractions, pulse > 120      Mild  7. LUNG HISTORY: "Do you have any history of lung disease?"  (e.g., pulmonary embolus, asthma, emphysema)     asthma 9. OTHER SYMPTOMS: "Do you have any other symptoms? (e.g., dizziness, runny nose, cough, chest pain, fever)     Weak fatigue, cough, sore throat headache  Protocols used: Breathing Difficulty-A-AH

## 2023-06-04 ENCOUNTER — Telehealth: Payer: Self-pay | Admitting: Family Medicine

## 2023-06-04 NOTE — Telephone Encounter (Signed)
Medication Refill - Medication: metFORMIN (GLUCOPHAGE-XR) 500 MG 24 hr tablet   Has the patient contacted their pharmacy? Yes.   (Agent: If no, request that the patient contact the pharmacy for the refill. If patient does not wish to contact the pharmacy document the reason why and proceed with request.) (Agent: If yes, when and what did the pharmacy advise?)  Preferred Pharmacy (with phone number or street name):  [id number] [place], [place] - [address] AT [facility] OF [address] Phone: [phone]  Fax: [phone]     Has the patient been seen for an appointment in the last year OR does the patient have an upcoming appointment? Yes.    Agent: Please be advised that RX refills may take up to 3 business days. We ask that you follow-up with your pharmacy.

## 2023-06-05 ENCOUNTER — Telehealth: Payer: Self-pay | Admitting: Family Medicine

## 2023-06-05 MED ORDER — METFORMIN HCL ER 500 MG PO TB24: ORAL_TABLET | ORAL | 0 refills | Status: DC

## 2023-06-05 NOTE — Telephone Encounter (Signed)
Called patient, left VM that Metformin has been refilled and sent to pharmacy.

## 2023-06-05 NOTE — Telephone Encounter (Signed)
Requested Prescriptions  Pending Prescriptions Disp Refills   metFORMIN (GLUCOPHAGE-XR) 500 MG 24 hr tablet 270 tablet 0    Sig: TAKE 2 TABLETS BY MOUTH EVERY DAY WITH BREAKFAST AND 1 TABLET EVERY EVENING     Endocrinology:  Diabetes - Biguanides Failed - 06/04/2023  9:23 AM      Failed - B12 Level in normal range and within 720 days    Vitamin B-12  Date Value Ref Range Status  04/14/2020 484 232 - 1,245 pg/mL Final         Failed - CBC within normal limits and completed in the last 12 months    WBC  Date Value Ref Range Status  07/26/2021 10.0 4.0 - 10.5 K/uL Final   RBC  Date Value Ref Range Status  07/26/2021 5.41 (H) 3.87 - 5.11 MIL/uL Final   Hemoglobin  Date Value Ref Range Status  07/26/2021 14.6 12.0 - 15.0 g/dL Final  91/47/8295 62.1 11.1 - 15.9 g/dL Final   HCT  Date Value Ref Range Status  07/26/2021 44.1 36.0 - 46.0 % Final   Hematocrit  Date Value Ref Range Status  04/17/2018 41.3 34.0 - 46.6 % Final   MCHC  Date Value Ref Range Status  07/26/2021 33.1 30.0 - 36.0 g/dL Final   [facility]  Date Value Ref Range Status  07/26/2021 27.0 26.0 - 34.0 pg Final   MCV  Date Value Ref Range Status  07/26/2021 81.5 80.0 - 100.0 fL Final  04/17/2018 81 79 - 97 fL Final   No results found for: "PLTCOUNTKUC", "LABPLAT", "POCPLA" RDW  Date Value Ref Range Status  07/26/2021 14.0 11.5 - 15.5 % Final  04/17/2018 14.5 12.3 - 15.4 % Final         Passed - Cr in normal range and within 360 days    Creatinine, Ser  Date Value Ref Range Status  05/02/2023 0.82 0.57 - 1.00 mg/dL Final   Creatinine, POC  Date Value Ref Range Status  05/26/2019 NA mg/dL Final         Passed - HBA1C is between 0 and 7.9 and within 180 days    Hgb A1c MFr Bld  Date Value Ref Range Status  05/02/2023 7.2 (H) 4.8 - 5.6 % Final    Comment:             Prediabetes: 5.7 - 6.4          Diabetes: >6.4          Glycemic control for adults with diabetes: <7.0          Passed - eGFR in  normal range and within 360 days    GFR calc Af Amer  Date Value Ref Range Status  04/14/2020 88 >59 mL/min/1.73 Final    Comment:    **[facility] currently reports eGFR in compliance with the current**   recommendations of the [employer]. [facility] will   update reporting as new guidelines are published from the NKF-ASN   Task force.    GFR, Estimated  Date Value Ref Range Status  07/26/2021 >60 >60 mL/min Final    Comment:    (NOTE) Calculated using the CKD-EPI Creatinine Equation (2021)    eGFR  Date Value Ref Range Status  05/02/2023 83 >59 mL/min/1.73 Final         Passed - Valid encounter within last 6 months    Recent Outpatient Visits           6 days ago Bronchitis  [facility] Sherrie Mustache, Demetrios Isaacs, MD   1 month ago Encounter for annual physical exam   [facility], Marzella Schlein, MD   7 months ago Type 2 diabetes mellitus with other specified complication, without long-term current use of insulin [facility])   [facility] [place], Marzella Schlein, MD   1 year ago Need for shingles vaccine   [facility] Caro Laroche, DO   1 year ago Type 2 diabetes mellitus with other specified complication, without long-term current use of insulin [facility])   [facility] [place], Marzella Schlein, MD       Future Appointments             In 2 months Bac[place]alupo, Marzella Schlein, MD [facility]

## 2023-06-05 NOTE — Telephone Encounter (Signed)
The patient called back in checking on the status of her medication refill for her metFORMIN (GLUCOPHAGE-XR) 500 MG 24 hr tablet . She states she is on her second day without her medication and she didn't realize originally she was going to be completely out of her medication when she called in the refill. She has also asked if additional refills can be put on it from now on. Please assist patient as she uses   [id number] [place], [place] - [address] AT [facility] OF [address] Phone: [phone]  Fax: [phone]     Please assist patient further

## 2023-07-09 ENCOUNTER — Other Ambulatory Visit: Payer: Self-pay | Admitting: Physician Assistant

## 2023-07-09 DIAGNOSIS — E119 Type 2 diabetes mellitus without complications: Secondary | ICD-10-CM

## 2023-08-05 ENCOUNTER — Telehealth: Payer: Self-pay

## 2023-08-05 DIAGNOSIS — E1169 Type 2 diabetes mellitus with other specified complication: Secondary | ICD-10-CM

## 2023-08-05 NOTE — Telephone Encounter (Signed)
Copied from CRM [id number]. Topic: General - Other >> Aug 04, 2023  4:05 PM Turkey B wrote: Reason for CRM: pt called in for orders for labs

## 2023-08-05 NOTE — Telephone Encounter (Signed)
I only see she is due for A1C

## 2023-08-05 NOTE — Telephone Encounter (Signed)
Agree. Only due for A1c. Could be done POC at time of her visit. If she wants it ahead of time ok to order and let her get at [facility]

## 2023-08-06 NOTE — Telephone Encounter (Signed)
Patient prefers to have A1C done at the lab. Order placed.

## 2023-08-07 ENCOUNTER — Ambulatory Visit: Payer: Managed Care, Other (non HMO) | Admitting: Family Medicine

## 2023-08-11 ENCOUNTER — Other Ambulatory Visit: Payer: Self-pay | Admitting: Family Medicine

## 2023-08-11 ENCOUNTER — Encounter: Payer: Self-pay | Admitting: Family Medicine

## 2023-08-11 ENCOUNTER — Ambulatory Visit (INDEPENDENT_AMBULATORY_CARE_PROVIDER_SITE_OTHER): Payer: Managed Care, Other (non HMO) | Admitting: Family Medicine

## 2023-08-11 VITALS — BP 124/74 | HR 86 | Temp 98.4°F | Ht 64.0 in | Wt 227.9 lb

## 2023-08-11 DIAGNOSIS — Z7984 Long term (current) use of oral hypoglycemic drugs: Secondary | ICD-10-CM | POA: Diagnosis not present

## 2023-08-11 DIAGNOSIS — Z1231 Encounter for screening mammogram for malignant neoplasm of breast: Secondary | ICD-10-CM

## 2023-08-11 DIAGNOSIS — E1169 Type 2 diabetes mellitus with other specified complication: Secondary | ICD-10-CM | POA: Diagnosis not present

## 2023-08-11 LAB — POCT GLYCOSYLATED HEMOGLOBIN (HGB A1C): Hemoglobin A1C: 7.3 % — AB (ref 4.0–5.6)

## 2023-08-11 MED ORDER — METFORMIN HCL ER 500 MG PO TB24: ORAL_TABLET | ORAL | 1 refills | Status: DC

## 2023-08-11 NOTE — Assessment & Plan Note (Signed)
Discussed importance of healthy weight management Discussed diet and exercise  

## 2023-08-11 NOTE — Assessment & Plan Note (Signed)
A1c increased to 7.3 despite increasing Jardiance to 25mg  daily. Discussed the importance of lifestyle modifications and the potential addition of injectable medications like Ozempic, Trulicity, or Mounjaro if A1c remains above 7. -Continue current regimen of Metformin and Jardiance 25mg  daily. -Encourage lifestyle modifications including diet changes. -Check A1c in 3 months (January 2025). -Consider adding injectable medication if A1c remains above 7 at next visit.

## 2023-08-11 NOTE — Progress Notes (Signed)
Established Patient Office Visit  Subjective   Patient ID: Nancy Blake, female    DOB: 04-25-1966  Age: 57 y.o. MRN: [id number]  No chief complaint on file.   HPI  Discussed the use of AI scribe software for clinical note transcription with the patient, who gave verbal consent to proceed.  History of Present Illness   The patient, with a history of diabetes, presents for a follow-up appointment. Over the past three months, the patient's A1c levels have increased slightly from 7.2 to 7.3 despite an increase in her Jardiance dosage from 10mg  to 25mg  daily. The patient expresses disappointment and surprise at the increase, as she had hoped her A1c levels would decrease. She acknowledges that she has not been monitoring her blood sugar levels daily as recommended and admits to not adhering strictly to a diabetic-friendly diet. The patient also mentions a recent diagnosis of a peanut allergy, which has made dietary changes more challenging. She is currently exploring alternatives to peanut butter, a staple in her diet prior to the allergy diagnosis. The patient also discusses upcoming appointments for a mammogram, a pulmonologist visit, an eye exam, and a skin check with a dermatologist. She is also planning to get two dental crowns.         ROS    Objective:     BP 124/74   Pulse 86   Temp 98.4 F (36.9 C)   Ht 5\' 4"  (1.626 m)   Wt 227 lb 14.4 oz (103.4 kg)   SpO2 96% Comment: room air  BMI 39.12 kg/m    Physical Exam Vitals reviewed.  Constitutional:      General: She is not in acute distress.    Appearance: Normal appearance. She is well-developed. She is not diaphoretic.  HENT:     Head: Normocephalic and atraumatic.  Eyes:     General: No scleral icterus.    Conjunctiva/sclera: Conjunctivae normal.  Neck:     Thyroid: No thyromegaly.  Cardiovascular:     Rate and Rhythm: Normal rate and regular rhythm.     Heart sounds: Normal heart sounds. No murmur  heard. Pulmonary:     Effort: Pulmonary effort is normal. No respiratory distress.     Breath sounds: Normal breath sounds. No wheezing, rhonchi or rales.  Musculoskeletal:     Cervical back: Neck supple.     Right lower leg: No edema.     Left lower leg: No edema.  Lymphadenopathy:     Cervical: No cervical adenopathy.  Skin:    General: Skin is warm and dry.     Findings: No rash.  Neurological:     Mental Status: She is alert and oriented to person, place, and time. Mental status is at baseline.  Psychiatric:        Mood and Affect: Mood normal.        Behavior: Behavior normal.      Results for orders placed or performed in visit on 08/11/23  POCT glycosylated hemoglobin (Hb A1C)  Result Value Ref Range   Hemoglobin A1C 7.3 (A) 4.0 - 5.6 %   HbA1c POC (<> result, manual entry)     HbA1c, POC (prediabetic range)     HbA1c, POC (controlled diabetic range)        The ASCVD Risk score (Arnett DK, et al., 2019) failed to calculate for the following reasons:   The valid total cholesterol range is 130 to 320 mg/dL    Assessment & Plan:  Problem List Items Addressed This Visit       Endocrine   Diabetes (HCC) - Primary    A1c increased to 7.3 despite increasing Jardiance to 25mg  daily. Discussed the importance of lifestyle modifications and the potential addition of injectable medications like Ozempic, Trulicity, or Mounjaro if A1c remains above 7. -Continue current regimen of Metformin and Jardiance 25mg  daily. -Encourage lifestyle modifications including diet changes. -Check A1c in 3 months (January 2025). -Consider adding injectable medication if A1c remains above 7 at next visit.      Relevant Medications   metFORMIN (GLUCOPHAGE-XR) 500 MG 24 hr tablet   Other Relevant Orders   POCT glycosylated hemoglobin (Hb A1C) (Completed)     Other   Morbid obesity (HCC)    Discussed importance of healthy weight management Discussed diet and exercise       Relevant  Medications   metFORMIN (GLUCOPHAGE-XR) 500 MG 24 hr tablet   Other Relevant Orders   POCT glycosylated hemoglobin (Hb A1C) (Completed)        General Health Maintenance Upcoming appointments for mammogram, pulmonology check, eye exam, and dermatology skin check. -Encourage completion of these appointments for comprehensive health maintenance. -Refill Metformin prescription.        Return in about 3 months (around 11/11/2023) for chronic disease f/u.    Shirlee Latch, MD

## 2023-08-22 ENCOUNTER — Ambulatory Visit
Admission: RE | Admit: 2023-08-22 | Discharge: 2023-08-22 | Disposition: A | Discharge status: Home / Self Care | Payer: Managed Care, Other (non HMO) | Source: Ambulatory Visit | Attending: Family Medicine | Admitting: Family Medicine

## 2023-08-22 DIAGNOSIS — Z1231 Encounter for screening mammogram for malignant neoplasm of breast: Secondary | ICD-10-CM | POA: Diagnosis present

## 2023-09-04 ENCOUNTER — Ambulatory Visit: Payer: Managed Care, Other (non HMO) | Admitting: Internal Medicine

## 2023-09-04 ENCOUNTER — Encounter: Payer: Self-pay | Admitting: Internal Medicine

## 2023-09-04 VITALS — BP 124/86 | HR 67 | Temp 97.3°F | Ht 64.0 in | Wt 229.2 lb

## 2023-09-04 DIAGNOSIS — G4733 Obstructive sleep apnea (adult) (pediatric): Secondary | ICD-10-CM

## 2023-09-04 DIAGNOSIS — J4520 Mild intermittent asthma, uncomplicated: Secondary | ICD-10-CM

## 2023-09-04 MED ORDER — ALBUTEROL SULFATE HFA 108 (90 BASE) MCG/ACT IN AERS: 2.0000 | INHALATION_SPRAY | Freq: Four times a day (QID) | RESPIRATORY_TRACT | 2 refills | Status: DC | PRN

## 2023-09-04 MED ORDER — IPRATROPIUM-ALBUTEROL 0.5-2.5 (3) MG/3ML IN SOLN: 3.0000 mL | RESPIRATORY_TRACT | 1 refills | Status: AC | PRN

## 2023-09-04 MED ORDER — IPRATROPIUM-ALBUTEROL 0.5-2.5 (3) MG/3ML IN SOLN
3.0000 mL | RESPIRATORY_TRACT | 1 refills | Status: DC | PRN
Start: 2023-09-04 — End: 2023-09-04

## 2023-09-04 NOTE — Patient Instructions (Addendum)
Excellent Job A+!!  GOLD STAR!  Continue CPAP as prescribed  Recommend weight loss  Continue Albuterol as needed  Avoid secondhand smoke Avoid SICK contacts Recommend  Masking  when appropriate Recommend Keep up-to-date with vaccinations

## 2023-09-04 NOTE — Progress Notes (Signed)
[facility] [place] Consultation     PFT  01/03/2016 reviewed with patient Ratio 88% FEV1 98% FVC 87% DLCO 95%  WNL    Diagnosis of OSA AHI was 28 Hypoxia to 77%  COMPLIANCE DL 01/4033 Excellent report 100% days 97%>4 hrs AHI 0.3 autoCPAP 5-20  CHIEF COMPLAINT:   Follow up ASTHMA Follow up assessment of OSA Follow up assessment of allergies   HISTORY OF PRESENT ILLNESS   DX of ASTHMA Asthma seems to be under control at this time  Uses albuterol infrequently No exacerbation at this time No evidence of heart failure at this time No evidence or signs of infection at this time No respiratory distress No fevers, chills, nausea, vomiting, diarrhea No evidence of lower extremity edema No evidence hemoptysis   Regarding OSA Compliance report reviewed with patient Excellent compliance report 100% for days pleasant 100% for greater than 4 hours AHI  reduce to 0.7 Patient uses and benefits from therapy   +allergies +peanuts +apples Avoidance and diet Carries benadryl at all times Carries EPI pen at all times    Current Medication:  Current Outpatient Medications:    acetaminophen (TYLENOL) 500 MG tablet, Take 500 mg by mouth every 6 (six) hours as needed., Disp: , Rfl:    albuterol (VENTOLIN HFA) 108 (90 Base) MCG/ACT inhaler, Inhale 2 puffs into the lungs every 6 (six) hours as needed for wheezing or shortness of breath., Disp: 18 g, Rfl: 2   atorvastatin (LIPITOR) 10 MG tablet, TAKE 1 TABLET(10 MG) BY MOUTH DAILY, Disp: 90 tablet, Rfl: 2   cetirizine (ZYRTEC) 10 MG tablet, TAKE 1 TABLET BY MOUTH EVERY DAY, Disp: 30 tablet, Rfl: 11   cholecalciferol (VITAMIN D3) 25 MCG (1000 UNIT) tablet, Take 1,000 Units by mouth daily., Disp: , Rfl:    clotrimazole-betamethasone (LOTRISONE) cream, Apply 1 Application topically daily., Disp: 30 g, Rfl: 0   empagliflozin (JARDIANCE) 25 MG TABS tablet, Take 1 tablet (25 mg total) by mouth daily., Disp: 90 tablet,  Rfl: 1   EPINEPHrine 0.3 mg/0.3 mL IJ SOAJ injection, Inject into the muscle., Disp: , Rfl:    ibuprofen (ADVIL,MOTRIN) 200 MG tablet, Take 600 mg by mouth every 6 (six) hours as needed for mild pain or moderate pain., Disp: , Rfl:    ipratropium-albuterol (DUONEB) 0.5-2.5 (3) MG/3ML SOLN, Take 3 mLs by nebulization every 4 (four) hours as needed., Disp: 360 mL, Rfl: 1   metFORMIN (GLUCOPHAGE-XR) 500 MG 24 hr tablet, TAKE 2 TABLETS BY MOUTH EVERY DAY WITH BREAKFAST AND 1 TABLET EVERY EVENING, Disp: 270 tablet, Rfl: 1   Multiple Vitamin (MULTIVITAMIN) tablet, Take 1 tablet by mouth daily., Disp: , Rfl:    omeprazole (PRILOSEC) 20 MG capsule, Take 20 mg by mouth daily., Disp: , Rfl:    ONETOUCH VERIO test strip, USE TO TEST BLOOD SUGAR EVERY DAY, Disp: 100 strip, Rfl: 0    ALLERGIES   Peanut (diagnostic), Apple, Soy allergy, and Vaseline petrolatum gauze [wound dressings]    BP 124/86 (BP Location: Right Arm, Cuff Size: Large)   Pulse 67   Temp (!) 97.3 F (36.3 C)   Ht 5\' 4"  (1.626 m)   Wt 229 lb 3.2 oz (104 kg)   SpO2 97%   BMI 39.34 kg/m       Review of Systems: Gen:  Denies  fever, sweats, chills weight loss  HEENT: Denies blurred vision, double vision, ear pain, eye pain, hearing loss, nose bleeds, sore throat Cardiac:  No dizziness,  chest pain or heaviness, chest tightness,edema, No JVD Resp:   No cough, -sputum production, -shortness of breath,-wheezing, -hemoptysis,  Other:  All other systems negative   Physical Examination:   General Appearance: No distress  EYES PERRLA, EOM intact.   NECK Supple, No JVD Pulmonary: normal breath sounds, No wheezing.  CardiovascularNormal S1,S2.  No m/r/g.   Abdomen: Benign, Soft, non-tender. Neurology UE/LE 5/5 strength, no focal deficits Ext pulses intact, cap refill intact ALL OTHER ROS ARE NEGATIVE       ASSESSMENT/PLAN   57 yo obese white female seen today for assessment of ASTHMA, OSA and allergies and  obesity     Asthma mild intermittent well-controlled  Avoidance of allergens Not on maintenance therapy Weaned off inhalers Uses albuterol as needed Avoid secondhand smoke Avoid SICK contacts  FOOD ALLERGY +peanut and food allergy Avoidance Recommend carrying ALB, EPI PEN and antihistamines at all times  Obesity -recommend significant weight loss -recommend changing diet  Deconditioned state -Recommend increased daily activity and exercise  OSA Patient with excellent compliance   100% for days, also <4 hrs AHI reduced to 0.7 Patient uses and benefits form therapy  Initial AHI 28   MEDICATION ADJUSTMENTS/LABS AND TESTS ORDERED: Continue CPAP as prescribed Recommend weight loss Continue Albuterol as needed Avoid allergens  CURRENT MEDICATIONS REVIEWED AT LENGTH WITH PATIENT TODAY   Patient satisfied with Plan of action and management. All questions answered  Follow up in 1 year  TOTAL TIME SPENT 32 mins   Lashay Osborne [name], M.D.  [facility]  Medical Director [facility] Medical Director [facility] Cardio-Pulmonary Department

## 2023-09-04 NOTE — Addendum Note (Signed)
Addended by: Bonney Leitz on: 09/04/2023 01:26 PM   Modules accepted: Orders

## 2023-09-15 ENCOUNTER — Telehealth: Payer: Self-pay | Admitting: Internal Medicine

## 2023-09-15 ENCOUNTER — Ambulatory Visit: Payer: Managed Care, Other (non HMO) | Admitting: Family Medicine

## 2023-09-15 ENCOUNTER — Ambulatory Visit: Payer: Self-pay

## 2023-09-15 MED ORDER — AZITHROMYCIN 250 MG PO TABS: ORAL_TABLET | ORAL | 0 refills | Status: DC

## 2023-09-15 MED ORDER — METHYLPREDNISOLONE 4 MG PO TBPK: ORAL_TABLET | ORAL | 0 refills | Status: DC

## 2023-09-15 NOTE — Telephone Encounter (Signed)
Patient calling in bc she has had a asthma  flare up over the weekend and has been coughing a lot and is very tired and very winded

## 2023-09-15 NOTE — Telephone Encounter (Signed)
  Chief Complaint: Wheezing/ SOB Symptoms: sinus congestion Frequency: last week Pertinent Negatives: Patient denies fever Disposition: [] ED /[] Urgent Care (no appt availability in office) / [x] Appointment(In office/virtual)/ []  [facility]/ [] Home Care/ [] Refused Recommended Disposition /[] [facility]/ []  Follow-up with PCP Additional Notes: Pt has asthma. She started with some sinus congestion and drainage. Now is having wheezing and SOB. PT has had this before and thinks she needs prednisone.  Reason for Disposition  [1] MILD difficulty breathing (e.g., minimal/no SOB at rest, SOB with walking, pulse <100) AND [2] NEW-onset or WORSE than normal  Answer Assessment - Initial Assessment Questions 1. RESPIRATORY STATUS: "Describe your breathing?" (e.g., wheezing, shortness of breath, unable to speak, severe coughing)      Wheezing, SOB 2. ONSET: "When did this breathing problem begin?"      Last week 3. PATTERN "Does the difficult breathing come and go, or has it been constant since it started?"      constant 4. SEVERITY: "How bad is your breathing?" (e.g., mild, moderate, severe)    - MILD: No SOB at rest, mild SOB with walking, speaks normally in sentences, can lie down, no retractions, pulse < 100.    - MODERATE: SOB at rest, SOB with minimal exertion and prefers to sit, cannot lie down flat, speaks in phrases, mild retractions, audible wheezing, pulse 100-120.    - SEVERE: Very SOB at rest, speaks in single words, struggling to breathe, sitting hunched forward, retractions, pulse > 120      Mild-moderate 5. RECURRENT SYMPTOM: "Have you had difficulty breathing before?" If Yes, ask: "When was the last time?" and "What happened that time?"      Yes - asthma 7. LUNG HISTORY: "Do you have any history of lung disease?"  (e.g., pulmonary embolus, asthma, emphysema)     Asthma 8. CAUSE: "What do you think is causing the breathing problem?"      URI + asthma 9. OTHER  SYMPTOMS: "Do you have any other symptoms? (e.g., dizziness, runny nose, cough, chest pain, fever)     Sinus congestion  Protocols used: Breathing Difficulty-A-AH

## 2023-09-15 NOTE — Telephone Encounter (Signed)
I have notified the patient and sent in the prescriptions. Nothing further needed.

## 2023-09-15 NOTE — Telephone Encounter (Signed)
Had some sinus stuff going on all last week. Cough with sputum (does not know what color) started Friday. Wheezing over the weekend. SOB.  No fevers, chills or sweats. Used Duoneb - BID Albuterol inhaler - used once yesterday.  Has not tested for [employer] and [place]   Dr. Belia Heman is out of the office. Dr. Jayme Cloud can you advise?

## 2023-09-15 NOTE — Telephone Encounter (Signed)
Use DuoNeb at least 3 times a day while having the exacerbation.  Azithromycin Z-Pak and Medrol Dosepak.  He should test for COVID as a precaution.  Follow-up with Dr. Belia Heman or APP in 3 to 4 weeks.  Call back if symptoms worsen or fail to improve.

## 2023-10-04 ENCOUNTER — Other Ambulatory Visit: Payer: Self-pay | Admitting: Family Medicine

## 2023-10-04 DIAGNOSIS — E119 Type 2 diabetes mellitus without complications: Secondary | ICD-10-CM

## 2023-10-06 NOTE — Telephone Encounter (Signed)
Requested by interface surescripts. Future visit in 1 month.  Requested Prescriptions  Pending Prescriptions Disp Refills   atorvastatin (LIPITOR) 10 MG tablet [Pharmacy Med Name: ATORVASTATIN 10MG  TABLETS] 90 tablet 1    Sig: TAKE 1 TABLET(10 MG) BY MOUTH DAILY     Cardiovascular:  Antilipid - Statins Failed - 10/06/2023  9:34 AM      Failed - Lipid Panel in normal range within the last 12 months    Cholesterol, Total  Date Value Ref Range Status  05/02/2023 126 100 - 199 mg/dL Final   LDL Chol Calc (NIH)  Date Value Ref Range Status  05/02/2023 57 0 - 99 mg/dL Final   HDL  Date Value Ref Range Status  05/02/2023 50 >39 mg/dL Final   Triglycerides  Date Value Ref Range Status  05/02/2023 105 0 - 149 mg/dL Final         Passed - Patient is not pregnant      Passed - Valid encounter within last 12 months    Recent Outpatient Visits           1 month ago Type 2 diabetes mellitus with other specified complication, without long-term current use of insulin (HCC)   [place] [facility] [place], Marzella Schlein, MD   4 months ago Bronchitis   [place] [facility] Malva Limes, MD   5 months ago Encounter for annual physical exam   [place] [facility] [place], Marzella Schlein, MD   11 months ago Type 2 diabetes mellitus with other specified complication, without long-term current use of insulin [facility])   [place] [facility] [place], Marzella Schlein, MD   1 year ago Need for shingles vaccine   [facility] [place], Darl Householder, DO       Future Appointments             In 1 month Bacigalupo, Marzella Schlein, MD [facility]             [facility] VERIO test strip [place] Med Name: ONE TOUCH VERIO TEST ST(NEW)100S] 100 strip 0    Sig: USE TO TEST BLOOD SUGAR EVERY DAY     Endocrinology: Diabetes - Testing Supplies Passed - 10/06/2023  9:34 AM      Passed -  Valid encounter within last 12 months    Recent Outpatient Visits           1 month ago Type 2 diabetes mellitus with other specified complication, without long-term current use of insulin (HCC)   [place] [facility] [place], Marzella Schlein, MD   4 months ago Bronchitis   [place] [facility] Malva Limes, MD   5 months ago Encounter for annual physical exam   [place] [facility] [place], Marzella Schlein, MD   11 months ago Type 2 diabetes mellitus with other specified complication, without long-term current use of insulin [facility])   [place] [facility] [place], Marzella Schlein, MD   1 year ago Need for shingles vaccine   [facility] Caro Laroche, DO       Future Appointments             In 1 month Bacigalupo, Marzella Schlein, MD [facility], [facility]

## 2023-10-28 ENCOUNTER — Other Ambulatory Visit: Payer: Self-pay | Admitting: Family Medicine

## 2023-10-30 NOTE — Telephone Encounter (Signed)
 Requested by interface surescripts. Future visit in 1 week .  Requested Prescriptions  Pending Prescriptions Disp Refills   empagliflozin  (JARDIANCE ) 25 MG TABS tablet [Pharmacy Med Name: JARDIANCE  25MG  TABLETS] 90 tablet 0    Sig: TAKE 1 TABLET(25 MG) BY MOUTH DAILY     Endocrinology:  Diabetes - SGLT2 Inhibitors Passed - 10/30/2023  1:47 PM      Passed - Cr in normal range and within 360 days    Creatinine, Ser  Date Value Ref Range Status  05/02/2023 0.82 0.57 - 1.00 mg/dL Final   Creatinine, POC  Date Value Ref Range Status  05/26/2019 NA mg/dL Final         Passed - HBA1C is between 0 and 7.9 and within 180 days    Hemoglobin A1C  Date Value Ref Range Status  08/11/2023 7.3 (A) 4.0 - 5.6 % Final   Hgb A1c MFr Bld  Date Value Ref Range Status  05/02/2023 7.2 (H) 4.8 - 5.6 % Final    Comment:             Prediabetes: 5.7 - 6.4          Diabetes: >6.4          Glycemic control for adults with diabetes: <7.0          Passed - eGFR in normal range and within 360 days    GFR calc Af Amer  Date Value Ref Range Status  04/14/2020 88 >59 mL/min/1.73 Final    Comment:    **[facility] currently reports eGFR in compliance with the current**   recommendations of the [employer]. [facility] will   update reporting as new guidelines are published from the NKF-ASN   Task force.    GFR, Estimated  Date Value Ref Range Status  07/26/2021 >60 >60 mL/min Final    Comment:    (NOTE) Calculated using the CKD-EPI Creatinine Equation (2021)    eGFR  Date Value Ref Range Status  05/02/2023 83 >59 mL/min/1.73 Final         Passed - Valid encounter within last 6 months    Recent Outpatient Visits           2 months ago Type 2 diabetes mellitus with other specified complication, without long-term current use of insulin (HCC)   [place] [facility] [place], Jon HERO, MD   5 months ago Bronchitis   [place] [facility] [place] Nancyann BRAVO, MD   5 months ago Encounter for annual physical exam   [place] [facility] [place], Jon HERO, MD   11 months ago Type 2 diabetes mellitus with other specified complication, without long-term current use of insulin [facility])   [place] [facility] [place], Jon HERO, MD   1 year ago Need for shingles vaccine   [facility] [place] Donald HERO, DO       Future Appointments             In 1 week Bacigalupo, Jon HERO, MD [facility]

## 2023-11-11 ENCOUNTER — Ambulatory Visit: Payer: Managed Care, Other (non HMO) | Admitting: Family Medicine

## 2023-11-28 ENCOUNTER — Ambulatory Visit: Payer: Managed Care, Other (non HMO) | Admitting: Family Medicine

## 2023-11-28 ENCOUNTER — Encounter: Payer: Self-pay | Admitting: Family Medicine

## 2023-11-28 VITALS — BP 134/80 | HR 93 | Ht 64.0 in | Wt 219.3 lb

## 2023-11-28 DIAGNOSIS — E1169 Type 2 diabetes mellitus with other specified complication: Secondary | ICD-10-CM | POA: Diagnosis not present

## 2023-11-28 DIAGNOSIS — E785 Hyperlipidemia, unspecified: Secondary | ICD-10-CM | POA: Diagnosis not present

## 2023-11-28 DIAGNOSIS — Z7984 Long term (current) use of oral hypoglycemic drugs: Secondary | ICD-10-CM

## 2023-11-28 DIAGNOSIS — G4733 Obstructive sleep apnea (adult) (pediatric): Secondary | ICD-10-CM | POA: Diagnosis not present

## 2023-11-28 LAB — POCT GLYCOSYLATED HEMOGLOBIN (HGB A1C): Hemoglobin A1C: 6.3 % — AB (ref 4.0–5.6)

## 2023-11-28 NOTE — Assessment & Plan Note (Signed)
 Continues to use CPAP machine and is under the care of Dr. Andee Kato. - Continue CPAP therapy - Follow up with Dr. Kassa as needed

## 2023-11-28 NOTE — Assessment & Plan Note (Signed)
 Cholesterol levels well-controlled as of July. Currently on Lipitor 10 mg daily. - Continue Lipitor 10 mg daily

## 2023-11-28 NOTE — Progress Notes (Signed)
 Established patient visit   Patient: Nancy Blake   DOB: [birth year]   58 y.o. Female  MRN: [id number] Visit Date: 11/28/2023  Today's healthcare provider: Jon Eva, MD   Chief Complaint  Patient presents with   Medical Management of Chronic Issues    3 month follow up   Diabetes    Blood sugar on avg 90-110 fasting, if checked after lunch 110-115 Symptoms: none Eye Exam: Need to update    Subjective    HPI HPI     Medical Management of Chronic Issues    Additional comments: 3 month follow up        Diabetes    Additional comments: Blood sugar on avg 90-110 fasting, if checked after lunch 110-115 Symptoms: none Eye Exam: Need to update       Last edited by Lilian Fitzpatrick, CMA on 11/28/2023  9:44 AM.       Discussed the use of AI scribe software for clinical note transcription with the patient, who gave verbal consent to proceed.  History of Present Illness   The patient, with a history of diabetes, hyperlipidemia, and sleep apnea, presents for a routine follow-up. She has been working hard to manage her diabetes, abstaining from sweet tea and M&Ms for the past four months. She has noticed a reduction in her daily blood glucose levels, with some readings in the 90s and even as low as 88.  In addition to her chronic conditions, the patient has recently developed new allergies. She has noticed allergic reactions to makeup products she has used for years, resulting in hives on her face. She has also developed food allergies, with peanuts and apples causing the most severe reactions. She has seen an allergist for these issues and has been advised to avoid these foods.  The patient also reports that she is still using her CPAP machine for sleep apnea and has recently seen her sleep specialist. She has no new concerns or symptoms related to her sleep apnea.         Medications: Outpatient Medications Prior to Visit  Medication Sig   acetaminophen   (TYLENOL ) 500 MG tablet Take 500 mg by mouth every 6 (six) hours as needed.   albuterol  (VENTOLIN  HFA) 108 (90 Base) MCG/ACT inhaler Inhale 2 puffs into the lungs every 6 (six) hours as needed for wheezing or shortness of breath.   atorvastatin  (LIPITOR) 10 MG tablet TAKE 1 TABLET(10 MG) BY MOUTH DAILY   cetirizine  (ZYRTEC ) 10 MG tablet TAKE 1 TABLET BY MOUTH EVERY DAY   cholecalciferol (VITAMIN D3) 25 MCG (1000 UNIT) tablet Take 1,000 Units by mouth daily.   clotrimazole -betamethasone  (LOTRISONE ) cream Apply 1 Application topically daily.   empagliflozin  (JARDIANCE ) 25 MG TABS tablet TAKE 1 TABLET(25 MG) BY MOUTH DAILY   EPINEPHrine  0.3 mg/0.3 mL IJ SOAJ injection Inject into the muscle.   ibuprofen  (ADVIL ,MOTRIN ) 200 MG tablet Take 600 mg by mouth every 6 (six) hours as needed for mild pain or moderate pain.   ipratropium-albuterol  (DUONEB) 0.5-2.5 (3) MG/3ML SOLN Take 3 mLs by nebulization every 4 (four) hours as needed.   metFORMIN  (GLUCOPHAGE -XR) 500 MG 24 hr tablet TAKE 2 TABLETS BY MOUTH EVERY DAY WITH BREAKFAST AND 1 TABLET EVERY EVENING   Multiple Vitamin (MULTIVITAMIN) tablet Take 1 tablet by mouth daily.   omeprazole (PRILOSEC) 20 MG capsule Take 20 mg by mouth daily.   ONETOUCH VERIO test strip USE TO TEST BLOOD SUGAR EVERY DAY   [DISCONTINUED]  azithromycin  (ZITHROMAX ) 250 MG tablet Take 2 tablets (500 mg) on  Day 1,  followed by 1 tablet (250 mg) once daily on Days 2 through 5. (Patient not taking: Reported on 11/28/2023)   [DISCONTINUED] methylPREDNISolone  (MEDROL  DOSEPAK) 4 MG TBPK tablet Take as directed on the package. (Patient not taking: Reported on 11/28/2023)   No facility-administered medications prior to visit.    Review of Systems     Objective    BP 134/80 (Cuff Size: Large)   Pulse 93   Ht 5' 4 (1.626 m)   Wt 219 lb 4.8 oz (99.5 kg)   SpO2 100%   BMI 37.64 kg/m    Physical Exam Vitals reviewed.  Constitutional:      General: She is not in acute distress.     Appearance: Normal appearance. She is well-developed. She is not diaphoretic.  HENT:     Head: Normocephalic and atraumatic.  Eyes:     General: No scleral icterus.    Conjunctiva/sclera: Conjunctivae normal.  Neck:     Thyroid : No thyromegaly.  Cardiovascular:     Rate and Rhythm: Normal rate and regular rhythm.     Heart sounds: Normal heart sounds. No murmur heard. Pulmonary:     Effort: Pulmonary effort is normal. No respiratory distress.     Breath sounds: Normal breath sounds. No wheezing, rhonchi or rales.  Musculoskeletal:     Cervical back: Neck supple.     Right lower leg: No edema.     Left lower leg: No edema.  Lymphadenopathy:     Cervical: No cervical adenopathy.  Skin:    General: Skin is warm and dry.     Findings: No rash.  Neurological:     Mental Status: She is alert and oriented to person, place, and time. Mental status is at baseline.  Psychiatric:        Mood and Affect: Mood normal.        Behavior: Behavior normal.      Results for orders placed or performed in visit on 11/28/23  POCT HgB A1C  Result Value Ref Range   Hemoglobin A1C 6.3 (A) 4.0 - 5.6 %   HbA1c POC (<> result, manual entry)     HbA1c, POC (prediabetic range)     HbA1c, POC (controlled diabetic range)      Assessment & Plan     Problem List Items Addressed This Visit       Respiratory   OSA (obstructive sleep apnea)   Continues to use CPAP machine and is under the care of Dr. Maggie. - Continue CPAP therapy - Follow up with Dr. Maggie as needed        Endocrine   Diabetes [facility]) - Primary   Regular follow-up for diabetes management. Significant dietary changes include eliminating sweet tea and M&Ms. Recent A1c is 6.3%, improved from 7.3% in July. Currently on metformin  XR (1000 mg in the morning and 500 mg in the evening) and Jardiance  (25 mg daily). - Continue metformin  XR 1000 mg in the morning and 500 mg in the evening - Continue Jardiance  25 mg daily - Order yearly  urine test for diabetes - Schedule follow-up in six months for annual physical      Relevant Orders   Urine Microalbumin w/creat. ratio   POCT HgB A1C (Completed)   Hyperlipidemia associated with type 2 diabetes mellitus (HCC)   Cholesterol levels well-controlled as of July. Currently on Lipitor 10 mg daily. - Continue Lipitor 10 mg  daily        Other   Morbid obesity (HCC)   Discussed importance of healthy weight management Discussed diet and exercise  BMI 37 and assoc with HLD, T2DM      Relevant Orders   Urine Microalbumin w/creat. ratio   POCT HgB A1C (Completed)        Allergic Reactions Increased sensitivity to substances, including makeup, resulting in allergic reactions such as hives. Known food allergies (peanuts, soy, apples) and past anaphylaxis. - Consider referral to an allergist for patch testing if symptoms persist - Avoid known allergens and switch to natural products as needed  General Health Maintenance Has not seen an eye doctor in the past year. Advised to schedule an eye exam, especially given improved A1c levels. - Schedule eye exam  Follow-up - Schedule follow-up in six months for annual physical        Return in about 6 months (around 05/27/2024) for CPE.       Jon Eva, MD  [facility] [phone] (phone) [phone] (fax)  [facility]

## 2023-11-28 NOTE — Patient Instructions (Signed)
Schedule an eye exam

## 2023-11-28 NOTE — Assessment & Plan Note (Signed)
 Regular follow-up for diabetes management. Significant dietary changes include eliminating sweet tea and M&Ms. Recent A1c is 6.3%, improved from 7.3% in July. Currently on metformin  XR (1000 mg in the morning and 500 mg in the evening) and Jardiance  (25 mg daily). - Continue metformin  XR 1000 mg in the morning and 500 mg in the evening - Continue Jardiance  25 mg daily - Order yearly urine test for diabetes - Schedule follow-up in six months for annual physical

## 2023-11-28 NOTE — Assessment & Plan Note (Signed)
 Discussed importance of healthy weight management Discussed diet and exercise  BMI 37 and assoc with HLD, T2DM

## 2023-11-29 LAB — MICROALBUMIN / CREATININE URINE RATIO
Creatinine, Urine: 18.9 mg/dL
Microalb/Creat Ratio: <16 mg/g{creat} (ref 0–29)
Microalbumin, Urine: <3 ug/mL

## 2023-11-29 LAB — SPECIMEN STATUS REPORT

## 2023-12-01 ENCOUNTER — Encounter: Payer: Self-pay | Admitting: Family Medicine

## 2024-01-02 ENCOUNTER — Other Ambulatory Visit: Payer: Self-pay | Admitting: Family Medicine

## 2024-01-02 DIAGNOSIS — E119 Type 2 diabetes mellitus without complications: Secondary | ICD-10-CM

## 2024-01-28 ENCOUNTER — Other Ambulatory Visit: Payer: Self-pay | Admitting: Family Medicine

## 2024-03-09 ENCOUNTER — Telehealth: Payer: Self-pay | Admitting: Family Medicine

## 2024-03-09 ENCOUNTER — Other Ambulatory Visit: Payer: Self-pay

## 2024-03-09 MED ORDER — METFORMIN HCL ER 500 MG PO TB24: ORAL_TABLET | ORAL | 1 refills | Status: DC

## 2024-03-09 NOTE — Telephone Encounter (Signed)
Converted to rf request

## 2024-03-09 NOTE — Telephone Encounter (Signed)
 [facility] faxed refill request for the following medications:  metFORMIN (GLUCOPHAGE-XR) 500 MG 24 hr tablet   Please advise.

## 2024-04-03 ENCOUNTER — Other Ambulatory Visit: Payer: Self-pay | Admitting: Family Medicine

## 2024-04-03 DIAGNOSIS — E119 Type 2 diabetes mellitus without complications: Secondary | ICD-10-CM

## 2024-04-23 ENCOUNTER — Other Ambulatory Visit: Payer: Self-pay | Admitting: Family Medicine

## 2024-05-24 ENCOUNTER — Telehealth: Payer: Self-pay

## 2024-05-24 DIAGNOSIS — E1169 Type 2 diabetes mellitus with other specified complication: Secondary | ICD-10-CM

## 2024-05-24 DIAGNOSIS — Z0000 Encounter for general adult medical examination without abnormal findings: Secondary | ICD-10-CM

## 2024-05-24 NOTE — Telephone Encounter (Signed)
 Copied from CRM [id number]. Topic: Clinical - Request for Lab/Test Order >> May 24, 2024  8:46 AM Laymon HERO wrote: Reason for CRM: Patient is coming in on 8/7 for her physical, wanting to know if an order can be put in for lab work before her appt. Please call patient as soon as possible

## 2024-05-25 NOTE — Telephone Encounter (Signed)
 Placed orders and relayed Dr. Rona message- patient states she can come in early tomorrow morning to have labwork done.

## 2024-05-25 NOTE — Addendum Note (Signed)
 Addended by: CHERRY CHIQUITA HERO on: 05/25/2024 08:35 AM   Modules accepted: Orders

## 2024-05-25 NOTE — Telephone Encounter (Signed)
 Ok to order A1c, lipid, cmp - use diagnoses hld and diabetes from her problem list. Recommend that she get them at least 2 days before her appt with me. Thanks

## 2024-05-27 ENCOUNTER — Ambulatory Visit: Payer: Managed Care, Other (non HMO) | Admitting: Family Medicine

## 2024-05-27 ENCOUNTER — Encounter: Payer: Self-pay | Admitting: Family Medicine

## 2024-05-27 ENCOUNTER — Ambulatory Visit: Payer: Self-pay | Admitting: Family Medicine

## 2024-05-27 VITALS — BP 123/73 | HR 85 | Temp 97.9°F | Ht 64.0 in | Wt 218.9 lb

## 2024-05-27 DIAGNOSIS — E785 Hyperlipidemia, unspecified: Secondary | ICD-10-CM

## 2024-05-27 DIAGNOSIS — Z0001 Encounter for general adult medical examination with abnormal findings: Secondary | ICD-10-CM | POA: Diagnosis not present

## 2024-05-27 DIAGNOSIS — G4733 Obstructive sleep apnea (adult) (pediatric): Secondary | ICD-10-CM

## 2024-05-27 DIAGNOSIS — E1169 Type 2 diabetes mellitus with other specified complication: Secondary | ICD-10-CM | POA: Diagnosis not present

## 2024-05-27 DIAGNOSIS — Z0000 Encounter for general adult medical examination without abnormal findings: Secondary | ICD-10-CM

## 2024-05-27 LAB — COMPREHENSIVE METABOLIC PANEL WITH GFR
ALT: 15 IU/L (ref 0–32)
AST: 17 IU/L (ref 0–40)
Albumin: 4 g/dL (ref 3.8–4.9)
Alkaline Phosphatase: 88 IU/L (ref 44–121)
BUN/Creatinine Ratio: 17 (ref 9–23)
BUN: 15 mg/dL (ref 6–24)
Bilirubin Total: 0.5 mg/dL (ref 0.0–1.2)
CO2: 20 mmol/L (ref 20–29)
Calcium: 9.4 mg/dL (ref 8.7–10.2)
Chloride: 104 mmol/L (ref 96–106)
Creatinine, Ser: 0.86 mg/dL (ref 0.57–1.00)
Globulin, Total: 2.4 g/dL (ref 1.5–4.5)
Glucose: 117 mg/dL — ABNORMAL HIGH (ref 70–99)
Potassium: 4.5 mmol/L (ref 3.5–5.2)
Sodium: 142 mmol/L (ref 134–144)
Total Protein: 6.4 g/dL (ref 6.0–8.5)
eGFR: 78 mL/min/1.73 (ref >59)

## 2024-05-27 LAB — LIPID PANEL WITH LDL/HDL RATIO
Cholesterol, Total: 168 mg/dL (ref 100–199)
HDL: 52 mg/dL (ref >39)
LDL Chol Calc (NIH): 95 mg/dL (ref 0–99)
LDL/HDL Ratio: 1.8 ratio (ref 0.0–3.2)
Triglycerides: 117 mg/dL (ref 0–149)
VLDL Cholesterol Cal: 21 mg/dL (ref 5–40)

## 2024-05-27 LAB — HEMOGLOBIN A1C
Est. average glucose Bld gHb Est-mCnc: 146 mg/dL
Hgb A1c MFr Bld: 6.7 % — ABNORMAL HIGH (ref 4.8–5.6)

## 2024-05-27 MED ORDER — EPINEPHRINE 0.3 MG/0.3ML IJ SOAJ: 0.3000 mg | INTRAMUSCULAR | 1 refills | Status: AC | PRN

## 2024-05-27 NOTE — Assessment & Plan Note (Signed)
 Currently controlled on CPAP. Denies any symptoms currently. Followed by Dr. Isaiah. - Continue follow up Dr. Isaiah as scheduled

## 2024-05-27 NOTE — Assessment & Plan Note (Signed)
 Currently controlled on medication regimen. Denies any symptoms or adverse medication side effects. Last FLP and today's FLP are stable. - Continue lipitor 10 mg daily - Order FLP (stable)

## 2024-05-27 NOTE — Progress Notes (Signed)
 Complete physical exam  Patient: Nancy Blake   DOB: 1966-10-02   58 y.o. Female  MRN: [id number]  Subjective:    Chief Complaint  Patient presents with   Annual Exam    Diet: Normal  Exercise: 2-4x week, walking usually for about 20-30 mins Sleep: Well Overall Feeling: Well  Concerns: None Vaccines (prevnar) : Declines at this time- states she had one previously.     Nancy Blake is a 58 y.o. female who presents today for a complete physical exam. She reports consuming a diabetic, normal calorie diet. Home exercise routine includes walking .5 hrs per 3-4 times a weeks. She generally feels well. She reports sleeping fairly well. She does not have additional problems to discuss today.    Most recent fall risk assessment:    05/27/2024    8:06 AM  Fall Risk   Falls in the past year? 0  Number falls in past yr: 0  Injury with Fall? 0  Risk for fall due to : No Fall Risks  Follow up Falls evaluation completed     Most recent depression screenings:    05/27/2024    8:07 AM 11/28/2023    9:37 AM  PHQ 2/9 Scores  PHQ - 2 Score 0 0  PHQ- 9 Score 1         Patient Care Team: Myrla Jon HERO, MD as PCP - General (Family Medicine)   Outpatient Medications Prior to Visit  Medication Sig   acetaminophen  (TYLENOL ) 500 MG tablet Take 500 mg by mouth every 6 (six) hours as needed.   albuterol  (VENTOLIN  HFA) 108 (90 Base) MCG/ACT inhaler Inhale 2 puffs into the lungs every 6 (six) hours as needed for wheezing or shortness of breath.   atorvastatin  (LIPITOR) 10 MG tablet TAKE 1 TABLET(10 MG) BY MOUTH DAILY   cholecalciferol (VITAMIN D3) 25 MCG (1000 UNIT) tablet Take 1,000 Units by mouth daily.   clotrimazole -betamethasone  (LOTRISONE ) cream Apply 1 Application topically daily.   fexofenadine  (ALLEGRA ) 60 MG tablet Take 60 mg by mouth daily.   glucose blood (ONETOUCH VERIO) test strip USE TO TEST BLOOD SUGAR EVERY DAY   ibuprofen  (ADVIL ,MOTRIN ) 200 MG tablet Take  600 mg by mouth every 6 (six) hours as needed for mild pain or moderate pain.   ipratropium-albuterol  (DUONEB) 0.5-2.5 (3) MG/3ML SOLN Take 3 mLs by nebulization every 4 (four) hours as needed.   JARDIANCE  25 MG TABS tablet TAKE 1 TABLET(25 MG) BY MOUTH DAILY   metFORMIN  (GLUCOPHAGE -XR) 500 MG 24 hr tablet TAKE 2 TABLETS BY MOUTH EVERY DAY WITH BREAKFAST AND 1 TABLET EVERY EVENING   Multiple Vitamin (MULTIVITAMIN) tablet Take 1 tablet by mouth daily.   omeprazole (PRILOSEC) 20 MG capsule Take 20 mg by mouth daily.   [DISCONTINUED] EPINEPHrine  0.3 mg/0.3 mL IJ SOAJ injection Inject into the muscle.   [DISCONTINUED] cetirizine  (ZYRTEC ) 10 MG tablet TAKE 1 TABLET BY MOUTH EVERY DAY (Patient not taking: Reported on 05/27/2024)   No facility-administered medications prior to visit.    ROS        Objective:     BP 123/73 (BP Location: Left Arm, Patient Position: Sitting, Cuff Size: Normal)   Pulse 85   Temp 97.9 F (36.6 C) (Oral)   Ht 5' 4 (1.626 m)   Wt 218 lb 14.4 oz (99.3 kg)   SpO2 98%   BMI 37.57 kg/m    Physical Exam Vitals reviewed.  Constitutional:      General: She is  not in acute distress.    Appearance: Normal appearance. She is not ill-appearing or diaphoretic.  HENT:     Head: Normocephalic.     Right Ear: Tympanic membrane, ear canal and external ear normal.     Left Ear: Tympanic membrane, ear canal and external ear normal.     Nose: Nose normal.     Mouth/Throat:     Mouth: Mucous membranes are moist.     Pharynx: Oropharynx is clear. No oropharyngeal exudate or posterior oropharyngeal erythema.  Eyes:     General: No scleral icterus.    Conjunctiva/sclera: Conjunctivae normal.     Pupils: Pupils are equal, round, and reactive to light.  Cardiovascular:     Rate and Rhythm: Normal rate and regular rhythm.     Pulses: Normal pulses.     Heart sounds: Normal heart sounds. No murmur heard.    No friction rub. No gallop.  Pulmonary:     Effort: Pulmonary  effort is normal. No respiratory distress.     Breath sounds: Normal breath sounds. No wheezing.  Abdominal:     General: There is no distension.     Palpations: Abdomen is soft.     Tenderness: There is no abdominal tenderness. There is no guarding.  Musculoskeletal:     Cervical back: Normal range of motion.     Right lower leg: No edema.     Left lower leg: No edema.  Lymphadenopathy:     Cervical: No cervical adenopathy.  Skin:    General: Skin is warm and dry.     Capillary Refill: Capillary refill takes less than 2 seconds.  Neurological:     Mental Status: She is alert and oriented to person, place, and time.  Psychiatric:        Mood and Affect: Mood normal.      No results found for any visits on 05/27/24.     Assessment & Plan:    Routine Health Maintenance and Physical Exam  Immunization History  Administered Date(s) Administered   Influenza Split 08/22/2015, 09/10/2017   Influenza,inj,Quad PF,6+ Mos 09/10/2017, 08/19/2018, 09/02/2019, 08/08/2020   Pneumococcal Polysaccharide-23 12/08/2015   Tdap 04/13/2018   Zoster Recombinant(Shingrix ) 12/26/2020, 04/17/2022    Health Maintenance  Topic Date Due   Hepatitis B Vaccines (1 of 3 - 19+ 3-dose series) Never done   Pneumococcal Vaccine: 19-49 Years (2 of 2 - PCV) 12/07/2016   Pneumococcal Vaccine: 50+ Years (2 of 2 - PCV) 12/07/2016   OPHTHALMOLOGY EXAM  03/20/2023   COVID-19 Vaccine (1) 06/12/2024 (Originally 03/31/1971)   INFLUENZA VACCINE  01/18/2025 (Originally 05/21/2024)   HEMOGLOBIN A1C  11/26/2024   Diabetic kidney evaluation - Urine ACR  11/27/2024   Diabetic kidney evaluation - eGFR measurement  05/26/2025   FOOT EXAM  05/27/2025   MAMMOGRAM  08/21/2025   Cervical Cancer Screening (HPV/Pap Cotest)  12/26/2025   Colonoscopy  03/19/2026   DTaP/Tdap/Td (2 - Td or Tdap) 04/13/2028   Hepatitis C Screening  Completed   HIV Screening  Completed   Zoster Vaccines- Shingrix   Completed   HPV VACCINES  Aged  Out   Meningococcal B Vaccine  Aged Out    Discussed health benefits of physical activity, and encouraged her to engage in regular exercise appropriate for her age and condition.  Problem List Items Addressed This Visit     Diabetes (HCC)   Currently well controlled on medication regimen. Denies any symptoms or adverse medication side effects.Current A1C is stable at  6.7. Previous A1Cs have been stable. - Continue metformin  1000 mg in the morning and 500 mg in the evening daily along with jardiance  25 mg daily - Order A1C (6.7 today)      OSA (obstructive sleep apnea)   Currently controlled on CPAP. Denies any symptoms currently. Followed by Dr. Isaiah. - Continue follow up Dr. Isaiah as scheduled      Morbid obesity [facility])   Currently managed with lifestyle modifications. Endorses trying to eat better and stay active. Interested in participating in more [facility] sponsored wellness events to help with her weight. - Discussed lifestyle modifications - Encouraged participation in [facility] wellness events to help promote healthy exercise and diet habits       Hyperlipidemia associated with type 2 diabetes mellitus (HCC)   Currently controlled on medication regimen. Denies any symptoms or adverse medication side effects. Last FLP and today's FLP are stable. - Continue lipitor 10 mg daily - Order FLP (stable)       Relevant Medications   EPINEPHrine  0.3 mg/0.3 mL IJ SOAJ injection   Other Visit Diagnoses       Encounter for annual physical exam    -  Primary      General Health Maintenance Discussed wellness and preventative health screenings to achieve health maintenance goals. - Encouraged flu and Covid vaccinations in the fall; patient declined - Schedule eye appointment and mammogram - Discussed remaining vaccinations, but patient declined any intervention - Up to date on other wellness measures and preventative screenings   Return in about 6 months (around 11/27/2024) for  chronic disease f/u.     Elia LULLA Blanch, Medical Student   Patient seen along with MS3 student, Elia Blanch. I personally evaluated this patient along with the student, and verified all aspects of the history, physical exam, and medical decision making as documented by the student. I agree with the student's documentation and have made all necessary edits.  Lakeisa Heninger, Jon HERO, MD, MPH [facility]

## 2024-05-27 NOTE — Assessment & Plan Note (Signed)
 Currently well controlled on medication regimen. Denies any symptoms or adverse medication side effects.Current A1C is stable at 6.7. Previous A1Cs have been stable. - Continue metformin  1000 mg in the morning and 500 mg in the evening daily along with jardiance  25 mg daily - Order A1C (6.7 today)

## 2024-05-27 NOTE — Assessment & Plan Note (Signed)
 Currently managed with lifestyle modifications. Endorses trying to eat better and stay active. Interested in participating in more [facility] sponsored wellness events to help with her weight. - Discussed lifestyle modifications - Encouraged participation in [facility] wellness events to help promote healthy exercise and diet habits

## 2024-07-23 ENCOUNTER — Other Ambulatory Visit: Payer: Self-pay | Admitting: Family Medicine

## 2024-08-04 ENCOUNTER — Other Ambulatory Visit: Payer: Self-pay | Admitting: Family Medicine

## 2024-08-19 ENCOUNTER — Other Ambulatory Visit: Payer: Self-pay | Admitting: Family Medicine

## 2024-08-19 DIAGNOSIS — Z1231 Encounter for screening mammogram for malignant neoplasm of breast: Secondary | ICD-10-CM

## 2024-09-06 LAB — OPHTHALMOLOGY REPORT-SCANNED

## 2024-09-24 ENCOUNTER — Encounter

## 2024-10-01 ENCOUNTER — Inpatient Hospital Stay: Admission: RE | Admit: 2024-10-01 | Discharge: 2024-10-01 | Attending: Family Medicine

## 2024-10-01 DIAGNOSIS — Z1231 Encounter for screening mammogram for malignant neoplasm of breast: Secondary | ICD-10-CM

## 2024-10-04 ENCOUNTER — Other Ambulatory Visit: Payer: Self-pay | Admitting: Family Medicine

## 2024-10-22 ENCOUNTER — Other Ambulatory Visit: Payer: Self-pay | Admitting: Family Medicine

## 2024-11-23 ENCOUNTER — Encounter: Payer: Self-pay | Admitting: Internal Medicine

## 2024-11-23 ENCOUNTER — Ambulatory Visit: Admitting: Internal Medicine

## 2024-11-23 VITALS — BP 110/80 | HR 81 | Temp 98.9°F | Ht 64.0 in | Wt 219.6 lb

## 2024-11-23 DIAGNOSIS — J4520 Mild intermittent asthma, uncomplicated: Secondary | ICD-10-CM | POA: Diagnosis not present

## 2024-11-23 DIAGNOSIS — G4733 Obstructive sleep apnea (adult) (pediatric): Secondary | ICD-10-CM

## 2024-11-23 MED ORDER — ALBUTEROL SULFATE HFA 108 (90 BASE) MCG/ACT IN AERS: 2.0000 | INHALATION_SPRAY | Freq: Four times a day (QID) | RESPIRATORY_TRACT | 12 refills | Status: AC | PRN

## 2024-11-23 NOTE — Progress Notes (Signed)
 " [facility]  Pulmonary Medicine Consultation     PFT  01/03/2016 reviewed with patient Ratio 88% FEV1 98% FVC 87% DLCO 95%  WNL    Diagnosis of OSA AHI was 28 Hypoxia to 77%  COMPLIANCE DL 0/7978 Excellent report 100% days 97%>4 hrs AHI 0.3 autoCPAP 5-20  CHIEF COMPLAINT:   Follow-up assessment for asthma Follow-up assessment for OSA    HISTORY OF PRESENT ILLNESS   DX of ASTHMA Has been under control Albuterol  use infrequent No evidence of exacerbation Not on any maintenance therapy at this time   Regarding OSA Discussed sleep data and reviewed with patient.  Encouraged proper weight management.  Discussed sleep hygiene Patient uses and benefits from therapy Using CPAP nightly and with naps Settings are comfortable and is sleeping well. Auto CPAP 5-20 AHI reduced to 0.7 Previous AHI 28 Patient uses and benefits from therapy   +allergies +peanuts +apples Avoidance and diet Carries benadryl  at all times Carries EPI pen at all times    Current Medication:  Current Outpatient Medications:    acetaminophen  (TYLENOL ) 500 MG tablet, Take 500 mg by mouth every 6 (six) hours as needed., Disp: , Rfl:    albuterol  (VENTOLIN  HFA) 108 (90 Base) MCG/ACT inhaler, Inhale 2 puffs into the lungs every 6 (six) hours as needed for wheezing or shortness of breath., Disp: 18 g, Rfl: 2   atorvastatin  (LIPITOR) 10 MG tablet, TAKE 1 TABLET(10 MG) BY MOUTH DAILY, Disp: 90 tablet, Rfl: 1   cholecalciferol (VITAMIN D3) 25 MCG (1000 UNIT) tablet, Take 1,000 Units by mouth daily., Disp: , Rfl:    clotrimazole -betamethasone  (LOTRISONE ) cream, Apply 1 Application topically daily., Disp: 30 g, Rfl: 0   EPINEPHrine  0.3 mg/0.3 mL IJ SOAJ injection, Inject 0.3 mg into the muscle as needed for anaphylaxis., Disp: 1 each, Rfl: 1   fexofenadine  (ALLEGRA ) 60 MG tablet, Take 60 mg by mouth daily., Disp: , Rfl:    glucose blood (ONETOUCH VERIO) test strip, USE TO TEST BLOOD SUGAR EVERY  DAY, Disp: 100 strip, Rfl: 12   ibuprofen  (ADVIL ,MOTRIN ) 200 MG tablet, Take 600 mg by mouth every 6 (six) hours as needed for mild pain or moderate pain., Disp: , Rfl:    ipratropium-albuterol  (DUONEB) 0.5-2.5 (3) MG/3ML SOLN, Take 3 mLs by nebulization every 4 (four) hours as needed., Disp: 1080 mL, Rfl: 1   JARDIANCE  25 MG TABS tablet, TAKE 1 TABLET(25 MG) BY MOUTH DAILY, Disp: 90 tablet, Rfl: 0   metFORMIN  (GLUCOPHAGE -XR) 500 MG 24 hr tablet, TAKE 2 TABLETS BY MOUTH EVERY DAY WITH BREAKFAST AND 1 TABLET EVERY EVENING, Disp: 270 tablet, Rfl: 1   Multiple Vitamin (MULTIVITAMIN) tablet, Take 1 tablet by mouth daily., Disp: , Rfl:    omeprazole (PRILOSEC) 20 MG capsule, Take 20 mg by mouth daily., Disp: , Rfl:     ALLERGIES   Peanut (diagnostic), Apple, Soy allergy (obsolete), and Vaseline petrolatum gauze [wound dressings]   BP 110/80   Pulse 81   Temp 98.9 F (37.2 C)   Ht 5' 4 (1.626 m)   Wt 219 lb 9.6 oz (99.6 kg)   SpO2 96%   BMI 37.69 kg/m      Alert awake no distress No wheezes no rhonchi S1-S2 no murmurs No edema No focal deficit      ASSESSMENT/PLAN   59 yo obese white female seen today for assessment of ASTHMA, OSA and allergies and  obesity    Assessment of OSA Previous AHI 28 Continue CPAP as  prescribed  Excellent compliance report Reviewed compliance report in detail with patient Patient definitely benefits the use of CPAP therapy as prescribed Using CPAP nightly and with naps Pressure setting is comfortable and is sleeping well. CPAP prescription 5-20 AHI reduced to 0.7  No evidence of acute heart failure at this time No respiratory distress No fevers, chills, nausea, vomiting, diarrhea No evidence hemoptysis  Patient Instructions Need to be using your CPAP every night, minimum of 4-6 hours a night.  Change equipment as directed. Wash your tubing with warm soap and water daily, hang to dry. Wash humidifier portion weekly. Use bottled,  distilled water and change daily Be aware of reduced alertness and do not drive or operate heavy machinery if experiencing this or drowsiness.  Exercise encouraged, as tolerated. Healthy weight management discussed.  Avoid or decrease alcohol consumption and medications that make you more sleepy, if possible. Notify if persistent daytime sleepiness occurs even with consistent use of PAP therapy.   Change CPAP supplies... Every month Mask cushions and/or nasal pillows CPAP machine filters Every 3 months Mask frame (not including the headgear) CPAP tubing Every 6 months Mask headgear Chin strap (if applicable) Humidifier water tub   Asthma mild intermittent well-controlled Avoidance of allergens Albuterol  as needed   FOOD ALLERGY +peanut and food allergy Avoidance Recommend carrying ALB, EPI PEN and antihistamines at all times  Obesity -recommend significant weight loss -recommend changing diet  Deconditioned state -Recommend increased daily activity and exercise    MEDICATION ADJUSTMENTS/LABS AND TESTS ORDERED: Continue CPAP as prescribed Avoid allergens Butyryl as needed   CURRENT MEDICATIONS REVIEWED AT LENGTH WITH PATIENT TODAY   Patient  satisfied with Plan of action and management. All questions answered   Follow up 1 year   I spent a total of 42 minutes dedicated to the care of this patient on the date of this encounter to include pre-visit review of records, face-to-face time with the patient discussing conditions above, post visit ordering of testing, clinical documentation with the electronic health record, making appropriate referrals as documented, and communicating necessary information to the patient's healthcare team.    The Patient requires high complexity decision making for assessment and support, frequent evaluation and titration of therapies, application of advanced monitoring technologies and extensive interpretation of multiple databases.   Patient satisfied with Plan of action and management. All questions answered    Nickolas Alm Cellar, M.D.  [facility]  Medical Director [facility]            "

## 2024-11-29 ENCOUNTER — Ambulatory Visit: Admitting: Family Medicine

## 2025-01-18 ENCOUNTER — Ambulatory Visit: Admitting: Family Medicine
